# Patient Record
Sex: Female | Born: 1965 | Race: White | Hispanic: No | Marital: Married | Smoking: Never smoker
Health system: Southern US, Community
[De-identification: ages and names within clinical notes are randomized; demographics above are authoritative.]

## PROBLEM LIST (undated history)

## (undated) DIAGNOSIS — F32A Depression, unspecified: Secondary | ICD-10-CM

## (undated) DIAGNOSIS — Z8669 Personal history of other diseases of the nervous system and sense organs: Secondary | ICD-10-CM

## (undated) DIAGNOSIS — M1990 Unspecified osteoarthritis, unspecified site: Secondary | ICD-10-CM

## (undated) DIAGNOSIS — K219 Gastro-esophageal reflux disease without esophagitis: Secondary | ICD-10-CM

## (undated) DIAGNOSIS — F329 Major depressive disorder, single episode, unspecified: Secondary | ICD-10-CM

## (undated) DIAGNOSIS — M722 Plantar fascial fibromatosis: Secondary | ICD-10-CM

## (undated) DIAGNOSIS — F419 Anxiety disorder, unspecified: Secondary | ICD-10-CM

## (undated) DIAGNOSIS — L570 Actinic keratosis: Secondary | ICD-10-CM

## (undated) DIAGNOSIS — K5790 Diverticulosis of intestine, part unspecified, without perforation or abscess without bleeding: Secondary | ICD-10-CM

## (undated) DIAGNOSIS — M5136 Other intervertebral disc degeneration, lumbar region: Secondary | ICD-10-CM

## (undated) DIAGNOSIS — M51369 Other intervertebral disc degeneration, lumbar region without mention of lumbar back pain or lower extremity pain: Secondary | ICD-10-CM

## (undated) DIAGNOSIS — D649 Anemia, unspecified: Secondary | ICD-10-CM

## (undated) HISTORY — DX: Anxiety disorder, unspecified: F41.9

## (undated) HISTORY — PX: OTHER SURGICAL HISTORY: SHX169

## (undated) HISTORY — DX: Depression, unspecified: F32.A

## (undated) HISTORY — DX: Actinic keratosis: L57.0

## (undated) HISTORY — DX: Major depressive disorder, single episode, unspecified: F32.9

## (undated) HISTORY — PX: MICRODISCECTOMY LUMBAR: SUR864

## (undated) HISTORY — PX: LAPAROSCOPIC GASTRIC SLEEVE RESECTION: SHX5895

---

## 2008-08-11 ENCOUNTER — Ambulatory Visit: Payer: Self-pay | Admitting: Family Medicine

## 2008-08-25 ENCOUNTER — Ambulatory Visit: Payer: Self-pay | Admitting: Family Medicine

## 2009-02-24 ENCOUNTER — Ambulatory Visit: Payer: Self-pay | Admitting: Family Medicine

## 2009-08-15 ENCOUNTER — Ambulatory Visit: Payer: Self-pay | Admitting: Family Medicine

## 2010-08-09 ENCOUNTER — Ambulatory Visit: Payer: Self-pay

## 2010-08-22 DIAGNOSIS — D229 Melanocytic nevi, unspecified: Secondary | ICD-10-CM

## 2010-08-22 HISTORY — DX: Melanocytic nevi, unspecified: D22.9

## 2010-09-25 ENCOUNTER — Ambulatory Visit: Payer: Self-pay | Admitting: Family Medicine

## 2010-12-17 ENCOUNTER — Ambulatory Visit: Payer: Self-pay | Admitting: Gastroenterology

## 2011-12-09 ENCOUNTER — Ambulatory Visit: Payer: Self-pay | Admitting: Family Medicine

## 2012-05-11 ENCOUNTER — Ambulatory Visit (INDEPENDENT_AMBULATORY_CARE_PROVIDER_SITE_OTHER): Payer: BC Managed Care – PPO | Admitting: Family Medicine

## 2012-05-11 VITALS — BP 133/89 | Ht 66.0 in | Wt 220.0 lb

## 2012-05-11 DIAGNOSIS — S838X9A Sprain of other specified parts of unspecified knee, initial encounter: Secondary | ICD-10-CM

## 2012-05-11 DIAGNOSIS — M79669 Pain in unspecified lower leg: Secondary | ICD-10-CM

## 2012-05-11 DIAGNOSIS — S86119A Strain of other muscle(s) and tendon(s) of posterior muscle group at lower leg level, unspecified leg, initial encounter: Secondary | ICD-10-CM

## 2012-05-11 DIAGNOSIS — M79609 Pain in unspecified limb: Secondary | ICD-10-CM

## 2012-05-12 DIAGNOSIS — S86119A Strain of other muscle(s) and tendon(s) of posterior muscle group at lower leg level, unspecified leg, initial encounter: Secondary | ICD-10-CM | POA: Insufficient documentation

## 2012-05-12 NOTE — Progress Notes (Signed)
  Subjective:    Patient ID: Bethany Flores, female    DOB: [birth year], 46 y.o.   MRN: [id number]  HPI Left calf pain with original injury June 3. Noticed after running up hills that she had some left calf pain. It improved after a couple of days. Yesterday he she took off quickly from a standing position to chase her son and felt an immediate pop and pain in the same place in her calf. Today it is painful to walk on that leg. Calf is tender. Prior to gym she had no problems with her calf, sever had knee surgery her knee problems.   Review of Systems    denies unusual weight change, denies numbness or tingling in left leg or foot. Objective:   Physical Exam  Vital signs reviewed. GENERAL: Well developed, well nourished, no acute distress Left calf has a fairly obvious area of defect on the medial portion of the gastrocnemius muscle. She has intact plantar and dorsiflexion. The area of defect is tender to palpation. ULTRASOUND:  Ultrasound reveals the intact lateral gastrocnemius muscle and an area on the medial gastroc of disrupted muscle fiber. This area coincides with the area of physical observable defect and tenderness.     Assessment & Plan:

## 2012-05-29 ENCOUNTER — Ambulatory Visit (INDEPENDENT_AMBULATORY_CARE_PROVIDER_SITE_OTHER): Payer: BC Managed Care – PPO | Admitting: Family Medicine

## 2012-05-29 VITALS — BP 122/83

## 2012-05-29 DIAGNOSIS — S86119A Strain of other muscle(s) and tendon(s) of posterior muscle group at lower leg level, unspecified leg, initial encounter: Secondary | ICD-10-CM

## 2012-05-29 DIAGNOSIS — S86819A Strain of other muscle(s) and tendon(s) at lower leg level, unspecified leg, initial encounter: Secondary | ICD-10-CM

## 2012-05-29 NOTE — Assessment & Plan Note (Signed)
She's done extremely well. We will keep her on a gradual return to activity. Next week she will run a max of 10 minutes a day flat level no hills no sprints a sudden starts her stops. We'll begin her on a little bit more stretching. Not full E. Centric stretches yet. Over the next 2-4 weeks she will workup to single stance E. Centric stretches and is redemonstrated today. Over the next 4 weeks she will increase by 10 minutes per week until she is running her typical 40 minutes a day. With any set backs she'll readjust her schedule. I'll see her in followup when necessary.

## 2012-05-29 NOTE — Progress Notes (Signed)
  Subjective:    Patient ID: Bethany Flores, female    DOB: [birth year], 46 y.o.   MRN: [id number]  HPI Followup gastrocnemius tear. She has followed instructions and she feels 75% better. She has not been running. She is anxious to get back to activity. No new symptoms. Pain is essentially resolved at rest and she has some pain with walking longer than about 30 minutes.   Review of Systems Denies foot numbness    Objective:   Physical Exam   , RednessGENERAL: Well-developed female no acute distress vital signs are reviewed LEFT calf: Nontender to palpation. They're still a slight defect noted intact dorsiflexion and plantar flexion strength with no pain with resisted motion. ULTRASOUND: The Has caused her 1 cm with 20.46 cm width.     Assessment & Plan:

## 2012-06-18 ENCOUNTER — Ambulatory Visit (INDEPENDENT_AMBULATORY_CARE_PROVIDER_SITE_OTHER): Payer: BC Managed Care – PPO | Admitting: Sports Medicine

## 2012-06-18 VITALS — BP 126/87

## 2012-06-18 DIAGNOSIS — M79669 Pain in unspecified lower leg: Secondary | ICD-10-CM

## 2012-06-18 DIAGNOSIS — M79609 Pain in unspecified limb: Secondary | ICD-10-CM

## 2012-06-18 MED ORDER — AMITRIPTYLINE HCL 25 MG PO TABS: 25.0000 mg | ORAL_TABLET | Freq: Every day | ORAL | Status: DC

## 2012-06-18 NOTE — Assessment & Plan Note (Signed)
Notices primarily located over the right lateral compartment I am not sure whether this has been triggered by sciatic irritation from her back or by an exercise related compartment syndrome  history of numbness and her early feelings of foot drop make compartment syndrome a strong possibility  Trial of amitriptyline 25 at night for nerve irritation and 4 relaxation of muscles around low back Barefoot walk- jogging drill once or twice a week  Continue back stretches-and knee to chest and knee to shoulder  Recheck 6 weeks

## 2012-06-18 NOTE — Patient Instructions (Addendum)
Start taking amitriptyline 25 mg at bedtime  Do barefoot jogging on grass 10 x 50 yards 1-2 times per week  Stop jogging at 2 miles for now  Do knees to chest stretch and knee to opposite side of chest  Please follow up in 6 weeks  Thank you for seeing [place] today!

## 2012-06-18 NOTE — Progress Notes (Signed)
  Subjective:    Patient ID: Bethany Flores, female    DOB: 11/25/1965, 46 y.o.   MRN: [id number]  HPI  Rt lateral foot and ankle pain x 3 years.  Was taking tae kwon do at the time pain started. Started running with Thad at [employer] x2 yrs ago- had the pain with running then. Top of rt foot gets numb with running 1-1.5 mile for the last 3 weeks. States she feels like her foot is "flopping", but it does not appear to be.  Runs 9-12 MPW. Only has the foot/ankle pain with running.  Has hx of sciatica on lt, and possible buldging disc although she never had an MRI. Went to Dr. Thereasa Distance for chiropractic care and this helped  Review of Systems     Objective:   Physical Exam  NAD Normal arch and foot structure Leg lengths equal Slight genu valgus Good strength on toe extension and flexion bilat Strong eversion, ER, plantar flexion, dorsiflexion bilat Straight leg raise to 80 on lt, and 90 on rt Toe and heel walking with no problems Able to walk on outside of feet  Ankle and knee reflexes normal Running gait neutral      Assessment & Plan:

## 2012-07-30 ENCOUNTER — Ambulatory Visit: Payer: BC Managed Care – PPO | Admitting: Sports Medicine

## 2012-08-28 ENCOUNTER — Other Ambulatory Visit: Payer: Self-pay | Admitting: Chiropractic Medicine

## 2012-08-28 DIAGNOSIS — M545 Low back pain: Secondary | ICD-10-CM

## 2012-09-02 ENCOUNTER — Ambulatory Visit
Admission: RE | Admit: 2012-09-02 | Discharge: 2012-09-02 | Disposition: A | Discharge status: Home / Self Care | Payer: BC Managed Care – PPO | Source: Ambulatory Visit | Attending: Chiropractic Medicine | Admitting: Chiropractic Medicine

## 2012-09-02 DIAGNOSIS — M545 Low back pain: Secondary | ICD-10-CM

## 2013-05-27 ENCOUNTER — Ambulatory Visit: Payer: Self-pay | Admitting: Specialist

## 2013-05-27 LAB — LIPASE, BLOOD: Lipase: 79 U/L (ref 73–393)

## 2013-05-27 LAB — CBC WITH DIFFERENTIAL/PLATELET
Basophil #: 0 10*3/uL (ref 0.0–0.1)
Eosinophil %: 2.9 %
HCT: 32.5 % — ABNORMAL LOW (ref 35.0–47.0)
HGB: 11.1 g/dL — ABNORMAL LOW (ref 12.0–16.0)
Lymphocyte #: 2 10*3/uL (ref 1.0–3.6)
Lymphocyte %: 27.4 %
MCH: 26.4 pg (ref 26.0–34.0)
MCHC: 34 g/dL (ref 32.0–36.0)
MCV: 78 fL — ABNORMAL LOW (ref 80–100)
Monocyte %: 4.7 %
Neutrophil #: 4.8 10*3/uL (ref 1.4–6.5)
RDW: 15.2 % — ABNORMAL HIGH (ref 11.5–14.5)

## 2013-05-27 LAB — COMPREHENSIVE METABOLIC PANEL
Alkaline Phosphatase: 117 U/L (ref 50–136)
BUN: 17 mg/dL (ref 7–18)
Bilirubin,Total: 0.4 mg/dL (ref 0.2–1.0)
Calcium, Total: 8.5 mg/dL (ref 8.5–10.1)
Creatinine: 0.85 mg/dL (ref 0.60–1.30)
EGFR (African American): >60
EGFR (Non-African Amer.): >60
SGPT (ALT): 19 U/L (ref 12–78)
Sodium: 139 mmol/L (ref 136–145)

## 2013-05-27 LAB — IRON AND TIBC
Iron Bind.Cap.(Total): 395 ug/dL (ref 250–450)
Iron Saturation: 9 %
Iron: 35 ug/dL — ABNORMAL LOW (ref 50–170)

## 2013-05-27 LAB — PROTIME-INR
INR: 1.1
Prothrombin Time: 13.9 secs (ref 11.5–14.7)

## 2013-05-27 LAB — BILIRUBIN, DIRECT: Bilirubin, Direct: 0.1 mg/dL (ref 0.00–0.20)

## 2013-05-27 LAB — PHOSPHORUS: Phosphorus: 2.9 mg/dL (ref 2.5–4.9)

## 2013-05-27 LAB — APTT: Activated PTT: 30.3 secs (ref 23.6–35.9)

## 2013-05-27 LAB — FERRITIN: Ferritin (ARMC): 12 ng/mL (ref 8–388)

## 2013-05-27 LAB — MAGNESIUM: Magnesium: 1.7 mg/dL — ABNORMAL LOW

## 2013-05-27 LAB — TSH: Thyroid Stimulating Horm: 1.87 u[IU]/mL

## 2013-05-27 LAB — HEMOGLOBIN A1C: Hemoglobin A1C: 5.2 % (ref 4.2–6.3)

## 2013-05-27 LAB — FOLATE: Folic Acid: 13.6 ng/mL (ref 3.1–100.0)

## 2013-06-16 ENCOUNTER — Ambulatory Visit: Payer: Self-pay | Admitting: Specialist

## 2013-06-18 ENCOUNTER — Ambulatory Visit: Payer: Self-pay | Admitting: Specialist

## 2013-07-08 ENCOUNTER — Ambulatory Visit: Payer: Self-pay | Admitting: Family Medicine

## 2013-08-26 ENCOUNTER — Ambulatory Visit: Payer: Self-pay | Admitting: Oncology

## 2013-08-26 LAB — CBC CANCER CENTER
Basophil %: 0.8 %
Eosinophil #: 0.3 x10 3/mm (ref 0.0–0.7)
HCT: 36 % (ref 35.0–47.0)
HGB: 12.1 g/dL (ref 12.0–16.0)
Lymphocyte %: 27.8 %
MCH: 26.2 pg (ref 26.0–34.0)
MCHC: 33.7 g/dL (ref 32.0–36.0)
MCV: 78 fL — ABNORMAL LOW (ref 80–100)
Monocyte #: 0.5 x10 3/mm (ref 0.2–0.9)
Monocyte %: 6.1 %
Neutrophil #: 5.1 x10 3/mm (ref 1.4–6.5)
Neutrophil %: 61.8 %
Platelet: 416 x10 3/mm (ref 150–440)
RBC: 4.64 10*6/uL (ref 3.80–5.20)
WBC: 8.3 x10 3/mm (ref 3.6–11.0)

## 2013-08-26 LAB — IRON AND TIBC
Iron Bind.Cap.(Total): 383 ug/dL (ref 250–450)
Iron Saturation: 8 %
Iron: 31 ug/dL — ABNORMAL LOW (ref 50–170)
Unbound Iron-Bind.Cap.: 352 ug/dL

## 2013-08-26 LAB — FERRITIN: Ferritin (ARMC): 18 ng/mL (ref 8–388)

## 2013-09-18 ENCOUNTER — Ambulatory Visit: Payer: Self-pay | Admitting: Oncology

## 2013-11-04 ENCOUNTER — Ambulatory Visit: Payer: Self-pay | Admitting: Oncology

## 2013-11-04 LAB — FERRITIN: Ferritin (ARMC): 47 ng/mL (ref 8–388)

## 2013-11-04 LAB — CBC CANCER CENTER
Basophil #: 0.1 x10 3/mm (ref 0.0–0.1)
Basophil %: 0.8 %
Eosinophil #: 0.1 x10 3/mm (ref 0.0–0.7)
Eosinophil %: 1.2 %
HCT: 40.8 % (ref 35.0–47.0)
HGB: 13.5 g/dL (ref 12.0–16.0)
Lymphocyte #: 2.3 x10 3/mm (ref 1.0–3.6)
Lymphocyte %: 26.2 %
MCV: 85 fL (ref 80–100)
Monocyte %: 4 %
Neutrophil #: 5.8 x10 3/mm (ref 1.4–6.5)
Neutrophil %: 67.8 %
Platelet: 359 x10 3/mm (ref 150–440)
RDW: 16.8 % — ABNORMAL HIGH (ref 11.5–14.5)
WBC: 8.6 x10 3/mm (ref 3.6–11.0)

## 2013-11-04 LAB — IRON AND TIBC
Iron Bind.Cap.(Total): 299 ug/dL (ref 250–450)
Iron Saturation: 20 %
Iron: 60 ug/dL (ref 50–170)

## 2013-11-18 ENCOUNTER — Ambulatory Visit: Payer: Self-pay | Admitting: Oncology

## 2014-01-17 ENCOUNTER — Ambulatory Visit: Payer: Self-pay | Admitting: Specialist

## 2014-02-03 ENCOUNTER — Ambulatory Visit: Payer: Self-pay | Admitting: Oncology

## 2014-02-03 LAB — FERRITIN: Ferritin (ARMC): 72 ng/mL (ref 8–388)

## 2014-02-03 LAB — CBC CANCER CENTER
BASOS ABS: 0 x10 3/mm (ref 0.0–0.1)
BASOS PCT: 0.6 %
Eosinophil #: 0.3 x10 3/mm (ref 0.0–0.7)
Eosinophil %: 3.7 %
HCT: 39.7 % (ref 35.0–47.0)
HGB: 13 g/dL (ref 12.0–16.0)
Lymphocyte #: 1.8 x10 3/mm (ref 1.0–3.6)
Lymphocyte %: 26.3 %
MCH: 28.9 pg (ref 26.0–34.0)
MCHC: 32.9 g/dL (ref 32.0–36.0)
MCV: 88 fL (ref 80–100)
MONOS PCT: 5.6 %
Monocyte #: 0.4 x10 3/mm (ref 0.2–0.9)
Neutrophil #: 4.4 x10 3/mm (ref 1.4–6.5)
Neutrophil %: 63.8 %
PLATELETS: 300 x10 3/mm (ref 150–440)
RBC: 4.51 10*6/uL (ref 3.80–5.20)
RDW: 13.8 % (ref 11.5–14.5)
WBC: 7 x10 3/mm (ref 3.6–11.0)

## 2014-02-03 LAB — IRON AND TIBC
Iron Bind.Cap.(Total): 214 ug/dL — ABNORMAL LOW (ref 250–450)
Iron Saturation: 18 %
Iron: 39 ug/dL — ABNORMAL LOW (ref 50–170)
UNBOUND IRON-BIND. CAP.: 175 ug/dL

## 2014-02-16 ENCOUNTER — Ambulatory Visit: Payer: Self-pay | Admitting: Oncology

## 2014-02-16 ENCOUNTER — Ambulatory Visit: Payer: Self-pay | Admitting: Specialist

## 2014-05-06 ENCOUNTER — Ambulatory Visit: Payer: Self-pay | Admitting: Oncology

## 2014-05-06 LAB — CBC CANCER CENTER
BASOS ABS: 0.1 x10 3/mm (ref 0.0–0.1)
BASOS PCT: 0.6 %
Eosinophil #: 0.1 x10 3/mm (ref 0.0–0.7)
Eosinophil %: 0.8 %
HCT: 39 % (ref 35.0–47.0)
HGB: 13 g/dL (ref 12.0–16.0)
Lymphocyte #: 2.3 x10 3/mm (ref 1.0–3.6)
Lymphocyte %: 26.6 %
MCH: 30 pg (ref 26.0–34.0)
MCHC: 33.3 g/dL (ref 32.0–36.0)
MCV: 90 fL (ref 80–100)
MONOS PCT: 5.1 %
Monocyte #: 0.4 x10 3/mm (ref 0.2–0.9)
Neutrophil #: 5.7 x10 3/mm (ref 1.4–6.5)
Neutrophil %: 66.9 %
Platelet: 352 x10 3/mm (ref 150–440)
RBC: 4.33 10*6/uL (ref 3.80–5.20)
RDW: 13.2 % (ref 11.5–14.5)
WBC: 8.5 x10 3/mm (ref 3.6–11.0)

## 2014-05-06 LAB — IRON AND TIBC
Iron Bind.Cap.(Total): 303 ug/dL (ref 250–450)
Iron Saturation: 14 %
Iron: 41 ug/dL — ABNORMAL LOW (ref 50–170)
UNBOUND IRON-BIND. CAP.: 262 ug/dL

## 2014-05-06 LAB — FERRITIN: FERRITIN (ARMC): 21 ng/mL (ref 8–388)

## 2014-05-18 ENCOUNTER — Ambulatory Visit: Payer: Self-pay | Admitting: Oncology

## 2014-08-05 ENCOUNTER — Ambulatory Visit: Payer: Self-pay | Admitting: Oncology

## 2014-08-08 LAB — CBC CANCER CENTER
BASOS ABS: 0.1 x10 3/mm (ref 0.0–0.1)
Basophil %: 0.8 %
Eosinophil #: 0.1 x10 3/mm (ref 0.0–0.7)
Eosinophil %: 1.3 %
HCT: 40.1 % (ref 35.0–47.0)
HGB: 13.3 g/dL (ref 12.0–16.0)
Lymphocyte #: 1.9 x10 3/mm (ref 1.0–3.6)
Lymphocyte %: 30.9 %
MCH: 30.5 pg (ref 26.0–34.0)
MCHC: 33.3 g/dL (ref 32.0–36.0)
MCV: 92 fL (ref 80–100)
Monocyte #: 0.3 x10 3/mm (ref 0.2–0.9)
Monocyte %: 5.5 %
NEUTROS ABS: 3.8 x10 3/mm (ref 1.4–6.5)
NEUTROS PCT: 61.5 %
PLATELETS: 316 x10 3/mm (ref 150–440)
RBC: 4.37 10*6/uL (ref 3.80–5.20)
RDW: 13.4 % (ref 11.5–14.5)
WBC: 6.2 x10 3/mm (ref 3.6–11.0)

## 2014-08-08 LAB — IRON AND TIBC
IRON BIND. CAP.(TOTAL): 301 ug/dL (ref 250–450)
IRON SATURATION: 20 %
Iron: 60 ug/dL (ref 50–170)
UNBOUND IRON-BIND. CAP.: 241 ug/dL

## 2014-08-08 LAB — FERRITIN: Ferritin (ARMC): 19 ng/mL (ref 8–388)

## 2014-08-18 ENCOUNTER — Ambulatory Visit: Payer: Self-pay | Admitting: Oncology

## 2014-11-14 ENCOUNTER — Ambulatory Visit: Payer: Self-pay | Admitting: Family Medicine

## 2014-12-07 ENCOUNTER — Encounter: Payer: Self-pay | Admitting: Sports Medicine

## 2014-12-07 ENCOUNTER — Ambulatory Visit (INDEPENDENT_AMBULATORY_CARE_PROVIDER_SITE_OTHER): Payer: BLUE CROSS/BLUE SHIELD | Admitting: Sports Medicine

## 2014-12-07 VITALS — BP 113/75 | HR 66 | Ht 66.0 in | Wt 150.0 lb

## 2014-12-07 DIAGNOSIS — M7631 Iliotibial band syndrome, right leg: Secondary | ICD-10-CM | POA: Diagnosis not present

## 2014-12-11 NOTE — Progress Notes (Signed)
  Bethany Flores - 49 y.o. female MRN [id number]  Date of birth: [birth year]  SUBJECTIVE: Chief Complaint:   right hip pain Patient reports 2 months of worsening right hip pain with increasing training for a half marathon that's coming up in April. She is working with [employer]. Pain along the greater trochanter that does not radiate. This does bother her at night and if she rolls over on it at will wake her but it is not generally keeping her up. She denies any numbness or tingling or pain radiating down past her upper thigh. She has had issues with IT band syndrome before and believes this is reoccurring. She has been trying from rolling and gentle stretching. No other therapeutic exercises.  ROS:  Per HPI.   HISTORY: Past Medical, Surgical, Social, and Family History Reviewed & Updated per EMR.  Pertinent Historical Findings include:  reports that she has never smoked. She does not have any smokeless tobacco history on file. Otherwise  Relatively healthy - on Zoloft   OBJECTIVE:  VS:   HT:5\' 6"  (167.6 cm)   WT:150 lb (68.04 kg)  BMI:24.3          BP:113/75 mmHg  HR:66bpm  TEMP: ( )  RESP:   PHYSICAL EXAM: GENERAL:  adult Caucasian female. In no discomfort; no respiratory distress   PSYCH: alert and appropriate, good insight   NEURO: sensation is intact to light touch in bilateral lower extremities   VASCULAR: No significant LE edema.  RIGHT HIP EXAM overall normal-appearing well aligned. She has minimal tenderness to palpation over the greater trochanter and moderate tenderness over the belly of the IT band most focally at the knee. This is asymptomatic and less directly palpated. Negative Nobles test. Hip abduction strength testing shows 4 out of 5 right abductor strength,, 5 out of 5 on the left. Right does show a TFL predominance. 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  ASSESSMENT: 1. IT band syndrome, right    PLAN: See problem based charting & AVS for  additional documentation.  discussed therapeutic exercises including hip abduction strengthening provide her with the IT band handout. Showed her modified IT band stretching she was attempting ITB stretching however was performing this in the opposite direction.     We will plan to see her back in 6 weeks to check on her strengthening at that time.   She did not bring her running shoes with her today so we are not able to perform a gait analysis with should be done at follow-up.   Discussed options for potential injection but patient elected to defer this time and encouraged icing, stretching, hip abduction strengthening from rolling and anti-inflammatories when necessary.  > Return in about 6 weeks (around 01/18/2015) for Gait analysis and recheck hip abduction strength weakness.

## 2015-01-11 ENCOUNTER — Ambulatory Visit: Payer: BLUE CROSS/BLUE SHIELD | Admitting: Sports Medicine

## 2015-01-18 ENCOUNTER — Ambulatory Visit: Payer: BLUE CROSS/BLUE SHIELD | Admitting: Sports Medicine

## 2015-01-25 ENCOUNTER — Encounter: Payer: Self-pay | Admitting: Sports Medicine

## 2015-01-25 ENCOUNTER — Ambulatory Visit (INDEPENDENT_AMBULATORY_CARE_PROVIDER_SITE_OTHER): Payer: BLUE CROSS/BLUE SHIELD | Admitting: Sports Medicine

## 2015-01-25 VITALS — BP 106/70 | HR 69 | Ht 66.0 in | Wt 150.0 lb

## 2015-01-25 DIAGNOSIS — M7631 Iliotibial band syndrome, right leg: Secondary | ICD-10-CM

## 2015-01-25 DIAGNOSIS — M7630 Iliotibial band syndrome, unspecified leg: Secondary | ICD-10-CM | POA: Insufficient documentation

## 2015-01-25 NOTE — Progress Notes (Signed)
  Bethany Flores - 49 y.o. female MRN [id number]  Date of birth: August 09, 1966  SUBJECTIVE: CC: Right hip pain, follow-up HPI: Significant improvement in right hip pain since changing shoes and working on IT band stretching.  Continuing to training with [employer].  New shoes and working on ITB stretching almost immediately resolved her symptoms.  ROS: Denies any numbness, tingling, recent weight loss or weight gain.  HISTORY:  Past Medical, Surgical, Social, and Family History reviewed & updated per EMR.  Pertinent Historical Findings include:  reports that she has never smoked. She does not have any smokeless tobacco history on file.  OBJECTIVE:  VS:   HT:5\' 6"  (167.6 cm)   WT:150 lb (68.04 kg)  BMI:24.3          BP:106/70 mmHg  HR:69bpm  TEMP: ( )  RESP:   PHYSICAL EXAM:  GENERAL:  adult Caucasian female. In no discomfort; no respiratory distress   PSYCH: alert and appropriate, good insight   NEURO: sensation is intact to light touch in bilateral lower extremities   VASCULAR: No significant LE edema.  RIGHT HIP EXAM overall normal-appearing well aligned. She has minimal tenderness to palpation over the greater trochanter and minimal TTP over the belly of the IT band most focally at the knee.  Negative Nobles test.  Hip abduction strength: Right 4/5, left 5/5. Right TFL predominance. Symmetric leg lengths.  GAIT: Slight right pelvic shift with heel toe strike mild overpronation of left foot, right neutral  ASSESSMENT: 1. IT band syndrome, right     Overall significantly improved.  PLAN: See problem based charting & AVS for additional documentation.  Given her overall neutral gait & marked improvement in symptoms no changes to be made today.   Did discuss gentle OTC long arch support to help with over pronation of left foot but patient would like to try her new motion control shoes. > Return if symptoms worsen or fail to improve.

## 2015-04-25 ENCOUNTER — Emergency Department: Payer: BLUE CROSS/BLUE SHIELD

## 2015-04-25 ENCOUNTER — Encounter: Payer: Self-pay | Admitting: *Deleted

## 2015-04-25 ENCOUNTER — Inpatient Hospital Stay
Admission: EM | Admit: 2015-04-25 | Discharge: 2015-04-27 | DRG: 419 | Disposition: A | Discharge status: Home / Self Care | Payer: BLUE CROSS/BLUE SHIELD | Attending: Surgery | Admitting: Surgery

## 2015-04-25 DIAGNOSIS — Z9884 Bariatric surgery status: Secondary | ICD-10-CM | POA: Diagnosis not present

## 2015-04-25 DIAGNOSIS — K8010 Calculus of gallbladder with chronic cholecystitis without obstruction: Secondary | ICD-10-CM | POA: Diagnosis present

## 2015-04-25 DIAGNOSIS — K660 Peritoneal adhesions (postprocedural) (postinfection): Secondary | ICD-10-CM | POA: Diagnosis present

## 2015-04-25 DIAGNOSIS — K810 Acute cholecystitis: Secondary | ICD-10-CM | POA: Diagnosis present

## 2015-04-25 DIAGNOSIS — Z791 Long term (current) use of non-steroidal anti-inflammatories (NSAID): Secondary | ICD-10-CM | POA: Diagnosis not present

## 2015-04-25 DIAGNOSIS — Z79899 Other long term (current) drug therapy: Secondary | ICD-10-CM

## 2015-04-25 LAB — URINALYSIS COMPLETE WITH MICROSCOPIC (ARMC ONLY)
BILIRUBIN URINE: NEGATIVE
Glucose, UA: NEGATIVE mg/dL
Hgb urine dipstick: NEGATIVE
KETONES UR: NEGATIVE mg/dL
Nitrite: NEGATIVE
PH: 8 (ref 5.0–8.0)
Protein, ur: NEGATIVE mg/dL
SPECIFIC GRAVITY, URINE: 1.011 (ref 1.005–1.030)

## 2015-04-25 LAB — CBC WITH DIFFERENTIAL/PLATELET
BASOS PCT: 0 %
Basophils Absolute: 0 10*3/uL (ref 0–0.1)
Eosinophils Absolute: 0.1 10*3/uL (ref 0–0.7)
Eosinophils Relative: 1 %
HEMATOCRIT: 38.8 % (ref 35.0–47.0)
Hemoglobin: 12.8 g/dL (ref 12.0–16.0)
LYMPHS ABS: 2.1 10*3/uL (ref 1.0–3.6)
LYMPHS PCT: 27 %
MCH: 28.4 pg (ref 26.0–34.0)
MCHC: 32.9 g/dL (ref 32.0–36.0)
MCV: 86.3 fL (ref 80.0–100.0)
MONO ABS: 0.4 10*3/uL (ref 0.2–0.9)
Monocytes Relative: 5 %
NEUTROS PCT: 67 %
Neutro Abs: 5.3 10*3/uL (ref 1.4–6.5)
PLATELETS: 290 10*3/uL (ref 150–440)
RBC: 4.5 MIL/uL (ref 3.80–5.20)
RDW: 13.7 % (ref 11.5–14.5)
WBC: 7.9 10*3/uL (ref 3.6–11.0)

## 2015-04-25 LAB — COMPREHENSIVE METABOLIC PANEL
ALK PHOS: 69 U/L (ref 38–126)
ALT: 13 U/L — AB (ref 14–54)
AST: 18 U/L (ref 15–41)
Albumin: 4.4 g/dL (ref 3.5–5.0)
Anion gap: 9 (ref 5–15)
BILIRUBIN TOTAL: 1.2 mg/dL (ref 0.3–1.2)
BUN: 20 mg/dL (ref 6–20)
CHLORIDE: 102 mmol/L (ref 101–111)
CO2: 26 mmol/L (ref 22–32)
Calcium: 9.1 mg/dL (ref 8.9–10.3)
Creatinine, Ser: 0.78 mg/dL (ref 0.44–1.00)
GFR calc Af Amer: >60 mL/min (ref >60)
Glucose, Bld: 92 mg/dL (ref 65–99)
Potassium: 4.1 mmol/L (ref 3.5–5.1)
Sodium: 137 mmol/L (ref 135–145)
TOTAL PROTEIN: 7.6 g/dL (ref 6.5–8.1)

## 2015-04-25 LAB — AMYLASE: Amylase: 25 U/L — ABNORMAL LOW (ref 28–100)

## 2015-04-25 LAB — LIPASE, BLOOD: Lipase: 47 U/L (ref 22–51)

## 2015-04-25 LAB — TROPONIN I: Troponin I: <0.03 ng/mL (ref <0.031)

## 2015-04-25 LAB — POCT PREGNANCY, URINE: PREG TEST UR: NEGATIVE

## 2015-04-25 MED ORDER — MORPHINE SULFATE 4 MG/ML IJ SOLN
INTRAMUSCULAR | Status: AC
  Filled 2015-04-25: qty 1

## 2015-04-25 MED ORDER — DIPHENHYDRAMINE HCL 12.5 MG/5ML PO ELIX
12.5000 mg | ORAL_SOLUTION | Freq: Four times a day (QID) | ORAL | Status: DC | PRN
  Filled 2015-04-25: qty 5

## 2015-04-25 MED ORDER — SERTRALINE HCL 50 MG PO TABS
ORAL_TABLET | ORAL | Status: AC
  Filled 2015-04-25: qty 1

## 2015-04-25 MED ORDER — GI COCKTAIL ~~LOC~~
ORAL | Status: AC
  Filled 2015-04-25: qty 30

## 2015-04-25 MED ORDER — IOHEXOL 300 MG/ML  SOLN
100.0000 mL | Freq: Once | INTRAMUSCULAR | Status: AC | PRN
  Administered 2015-04-25: 100 mL via INTRAVENOUS

## 2015-04-25 MED ORDER — ACETAMINOPHEN 325 MG PO TABS: 650.0000 mg | ORAL_TABLET | Freq: Four times a day (QID) | ORAL | Status: DC | PRN

## 2015-04-25 MED ORDER — FAMOTIDINE IN NACL 20-0.9 MG/50ML-% IV SOLN
INTRAVENOUS | Status: AC
  Filled 2015-04-25: qty 50

## 2015-04-25 MED ORDER — METRONIDAZOLE IN NACL 5-0.79 MG/ML-% IV SOLN
INTRAVENOUS | Status: AC
  Administered 2015-04-25: 500 mg via INTRAVENOUS
  Filled 2015-04-25: qty 100

## 2015-04-25 MED ORDER — CIPROFLOXACIN IN D5W 400 MG/200ML IV SOLN
400.0000 mg | Freq: Once | INTRAVENOUS | Status: AC
  Administered 2015-04-25: 400 mg via INTRAVENOUS

## 2015-04-25 MED ORDER — ACETAMINOPHEN 650 MG RE SUPP: 650.0000 mg | Freq: Four times a day (QID) | RECTAL | Status: DC | PRN

## 2015-04-25 MED ORDER — FAMOTIDINE IN NACL 20-0.9 MG/50ML-% IV SOLN
20.0000 mg | Freq: Two times a day (BID) | INTRAVENOUS | Status: DC
  Administered 2015-04-25 – 2015-04-26 (×2): 20 mg via INTRAVENOUS
  Filled 2015-04-25 (×3): qty 50

## 2015-04-25 MED ORDER — SODIUM CHLORIDE 0.9 % IV BOLUS (SEPSIS)
1000.0000 mL | Freq: Once | INTRAVENOUS | Status: AC
  Administered 2015-04-25: 1000 mL via INTRAVENOUS

## 2015-04-25 MED ORDER — KCL IN DEXTROSE-NACL 20-5-0.45 MEQ/L-%-% IV SOLN
INTRAVENOUS | Status: DC
  Administered 2015-04-25 – 2015-04-27 (×6): via INTRAVENOUS
  Filled 2015-04-25 (×10): qty 1000

## 2015-04-25 MED ORDER — KCL IN DEXTROSE-NACL 20-5-0.45 MEQ/L-%-% IV SOLN
INTRAVENOUS | Status: AC
  Filled 2015-04-25: qty 1000

## 2015-04-25 MED ORDER — METRONIDAZOLE IN NACL 5-0.79 MG/ML-% IV SOLN
500.0000 mg | Freq: Once | INTRAVENOUS | Status: AC
  Administered 2015-04-25: 500 mg via INTRAVENOUS

## 2015-04-25 MED ORDER — MORPHINE SULFATE 2 MG/ML IJ SOLN
2.0000 mg | INTRAMUSCULAR | Status: DC | PRN
  Administered 2015-04-25: 4 mg via INTRAVENOUS
  Administered 2015-04-26 (×2): 2 mg via INTRAVENOUS
  Filled 2015-04-25: qty 1
  Filled 2015-04-25 (×2): qty 2

## 2015-04-25 MED ORDER — PROMETHAZINE HCL 25 MG PO TABS: 25.0000 mg | ORAL_TABLET | Freq: Four times a day (QID) | ORAL | Status: DC | PRN

## 2015-04-25 MED ORDER — SERTRALINE HCL 50 MG PO TABS
50.0000 mg | ORAL_TABLET | Freq: Every day | ORAL | Status: DC
  Administered 2015-04-25 – 2015-04-27 (×2): 50 mg via ORAL
  Filled 2015-04-25: qty 1

## 2015-04-25 MED ORDER — GI COCKTAIL ~~LOC~~
30.0000 mL | Freq: Once | ORAL | Status: AC
  Administered 2015-04-25: 30 mL via ORAL

## 2015-04-25 MED ORDER — SODIUM CHLORIDE 0.9 % IV SOLN
1.5000 g | Freq: Four times a day (QID) | INTRAVENOUS | Status: DC
  Administered 2015-04-25 – 2015-04-27 (×6): 1.5 g via INTRAVENOUS
  Filled 2015-04-25 (×7): qty 1.5

## 2015-04-25 MED ORDER — ONDANSETRON HCL 4 MG/2ML IJ SOLN
4.0000 mg | Freq: Once | INTRAMUSCULAR | Status: AC
  Administered 2015-04-25: 4 mg via INTRAVENOUS

## 2015-04-25 MED ORDER — CIPROFLOXACIN IN D5W 400 MG/200ML IV SOLN
INTRAVENOUS | Status: AC
  Filled 2015-04-25: qty 200

## 2015-04-25 MED ORDER — PROMETHAZINE HCL 25 MG/ML IJ SOLN
25.0000 mg | Freq: Four times a day (QID) | INTRAMUSCULAR | Status: DC | PRN
Start: 2015-04-25 — End: 2015-04-27

## 2015-04-25 MED ORDER — ONDANSETRON HCL 4 MG/2ML IJ SOLN
INTRAMUSCULAR | Status: AC
  Filled 2015-04-25: qty 2

## 2015-04-25 MED ORDER — DIPHENHYDRAMINE HCL 50 MG/ML IJ SOLN: 12.5000 mg | Freq: Four times a day (QID) | INTRAMUSCULAR | Status: DC | PRN

## 2015-04-25 MED ORDER — MORPHINE SULFATE 4 MG/ML IJ SOLN
4.0000 mg | Freq: Once | INTRAMUSCULAR | Status: AC
  Administered 2015-04-25: 4 mg via INTRAVENOUS

## 2015-04-25 MED ORDER — IOHEXOL 240 MG/ML SOLN
25.0000 mL | Freq: Once | INTRAMUSCULAR | Status: AC | PRN
  Administered 2015-04-25: 50 mL via ORAL

## 2015-04-25 NOTE — ED Notes (Signed)
Pt reports abdominal/epigastric pain starting this morning, pt reports having" dry heaves" this morning

## 2015-04-25 NOTE — H&P (Signed)
Patient ID: Bethany Flores, female   DOB: Sep 07, 1966, 49 y.o.   MRN: [id number]  History of Present Illness Bethany Flores is a 49 y.o. female with severe xiphoid and sub-right breast pain since last night, that waxes and wanes but never completely resolves. She had some dry heaves initially and since that time denies nausea and vomiting. She denies fever, chills, change in the color of her urine, stool, eyes, and skin.  Past Medical History History reviewed. No pertinent past medical history. The patient underwent a laparoscopic gastric sleeve resection by Dr. Darnell Level last year and lost 80 pounds.   No past surgical history on file.  No Known Allergies  Current Facility-Administered Medications  Medication Dose Route Frequency Provider Last Rate Last Dose  . ciprofloxacin (CIPRO) IVPB 400 mg  400 mg Intravenous Once Orbie Pyo, MD 200 mL/hr at 04/25/15 1329 400 mg at 04/25/15 1329  . metroNIDAZOLE (FLAGYL) IVPB 500 mg  500 mg Intravenous Once Orbie Pyo, MD       Current Outpatient Prescriptions  Medication Sig Dispense Refill  . acetaminophen (TYLENOL) 325 MG tablet Take 650 mg by mouth every 6 (six) hours as needed for moderate pain.    . Calcium Carbonate-Vitamin D 600-200 MG-UNIT TABS Take 1 tablet by mouth daily.    . ferrous sulfate 325 (65 FE) MG tablet Take 325 mg by mouth daily with breakfast.    . ibuprofen (ADVIL,MOTRIN) 200 MG tablet Take 400 mg by mouth every 6 (six) hours as needed for moderate pain.     . Multiple Vitamins-Minerals (MULTIVITAMIN ADULT) TABS Take 1 tablet by mouth daily.    . sertraline (ZOLOFT) 50 MG tablet Take 50 mg by mouth daily.    Marland Kitchen amitriptyline (ELAVIL) 25 MG tablet Take 1 tablet (25 mg total) by mouth at bedtime. (Patient not taking: Reported on 04/25/2015) 30 tablet 1    Family History No family history on file.   Her father died in a motor vehicle accident at age 60 and her mother is currently 52 and suffers from  glaucoma only.  Social History History  Substance Use Topics  . Smoking status: Never Smoker   . Smokeless tobacco: Not on file  . Alcohol Use: 0.0 oz/week    0 Standard drinks or equivalent per week     Comment: occasionally     Married, lives with her husband and one child. She works as an Optometrist for [employer].  Review of Systems A 10 point review of systems was asked and was negative except for the following positive findings: Severe epigastric pain radiating to her back.  Physical Exam Blood pressure 132/72, pulse 85, resp. rate 16, last menstrual period 04/11/2015, SpO2 98 %.  CONSTITUTIONAL: Pleasant, healthy-appearing middle-aged white female. EYES: Pupils equal, round, and reactive to light, Sclera non-icteric. EARS, NOSE, MOUTH AND THROAT: The oropharynx is clear. Oral mucosa is pink and moist. Hearing is intact to voice.  NECK: Trachea is midline, and there is no jugular venous distension. Thyroid is without palpable abnormalities. LYMPH NODES:  Lymph nodes in the neck are not enlarged. RESPIRATORY:  Lungs are clear, and breath sounds are equal bilaterally. Normal respiratory effort without pathologic use of accessory muscles. CARDIOVASCULAR: Heart is regular without murmurs, gallops, or rubs. GI: The abdomen is soft, nontender, and nondistended. There were no palpable masses. There was no hepatosplenomegaly. There were normal bowel sounds. GU: Deferred. MUSCULOSKELETAL:  Normal muscle strength and tone in all four extremities.  SKIN: Skin turgor is normal. There are no pathologic skin lesions.  NEUROLOGIC:  Motor and sensation is grossly normal.  Cranial nerves are grossly intact. PSYCH:  Alert and oriented to person, place and time. Affect is normal.  Data Reviewed All labs and CT scan. I have personally reviewed the patient's imaging and medical records.    Assessment    Acute cholecystitis  Plan    Admit, IV fluid hydration, IV antibiotics, laparoscopic  cholecystectomy tomorrow. The patient and her husband understand the 1 in 200 risk of common bile duct injury and that although rare, if it occurs, it is a quite serious complication. She is interested in proceeding with the plan.  Face-to-face time spent with the patient and care providers was 50 minutes, with more than 50% of the time spent counseling, educating, and coordinating care of the patient.     Consuela Mimes 04/25/2015, 1:54 PM

## 2015-04-25 NOTE — ED Provider Notes (Signed)
[facility] Emergency Department Provider Note  ____________________________________________  Time seen: Approximately 950 AM  I have reviewed the triage vital signs and the nursing notes.   HISTORY  Chief Complaint Abdominal Pain    HPI Bethany Flores is a 49 y.o. female with a history of gastric sleeve procedure done in February 2015 who presents today with upper abdominal burning and cramping that started at midnight last night. She has had several dry heaves no productive vomitus. No diarrhea. Pain is moderate. Tried Tums at home without any relief. No vaginal bleeding or dysuria. Patient is not post menopausal.   History reviewed. No pertinent past medical history.  Patient Active Problem List   Diagnosis Date Noted  . IT band syndrome 01/25/2015  . Gastrocnemius muscle tear 05/12/2012  . Calf pain 05/11/2012    No past surgical history on file.  Current Outpatient Rx  Name  Route  Sig  Dispense  Refill  . acetaminophen (TYLENOL) 325 MG tablet   Oral   Take 650 mg by mouth every 6 (six) hours as needed for moderate pain.         . Calcium Carbonate-Vitamin D 600-200 MG-UNIT TABS   Oral   Take 1 tablet by mouth daily.         . ferrous sulfate 325 (65 FE) MG tablet   Oral   Take 325 mg by mouth daily with breakfast.         . ibuprofen (ADVIL,MOTRIN) 200 MG tablet   Oral   Take 400 mg by mouth every 6 (six) hours as needed for moderate pain.          . Multiple Vitamins-Minerals (MULTIVITAMIN ADULT) TABS   Oral   Take 1 tablet by mouth daily.         . sertraline (ZOLOFT) 50 MG tablet   Oral   Take 50 mg by mouth daily.         Marland Kitchen EXPIRED: amitriptyline (ELAVIL) 25 MG tablet   Oral   Take 1 tablet (25 mg total) by mouth at bedtime. Patient not taking: Reported on 04/25/2015   30 tablet   1     Allergies Review of patient's allergies indicates no known allergies.  No family history on file.  Social  History History  Substance Use Topics  . Smoking status: Never Smoker   . Smokeless tobacco: Not on file  . Alcohol Use: 0.0 oz/week    0 Standard drinks or equivalent per week     Comment: occasionally    Review of Systems Constitutional: No fever/chills Eyes: No visual changes. ENT: No sore throat. Cardiovascular: Denies chest pain. Respiratory: Denies shortness of breath. Gastrointestinal: As above  Genitourinary: Negative for dysuria. Musculoskeletal: Negative for back pain. Skin: Negative for rash. Neurological: Negative for headaches, focal weakness or numbness.  10-point ROS otherwise negative.  ____________________________________________   PHYSICAL EXAM:  VITAL SIGNS: ED Triage Vitals  Enc Vitals Group     BP 04/25/15 0906 120/79 mmHg     Pulse Rate 04/25/15 0906 57     Resp 04/25/15 0906 16     Temp --      Temp src --      SpO2 04/25/15 0906 100 %     Weight --      Height --      Head Cir --      Peak Flow --      Pain Score 04/25/15 0831 8  Pain Loc --      Pain Edu? --      Excl. in [place]? --     Constitutional: Alert and oriented. Well appearing and in no acute distress. Eyes: Conjunctivae are normal. PERRL. EOMI. Head: Atraumatic. Nose: No congestion/rhinnorhea. Mouth/Throat: Mucous membranes are moist.  Oropharynx non-erythematous. Neck: No stridor.   Cardiovascular: Normal rate, regular rhythm. Grossly normal heart sounds.  Good peripheral circulation. Respiratory: Normal respiratory effort.  No retractions. Lungs CTAB. Gastrointestinal: Soft with tenderness to the epigastrium. Right upper quadrant tenderness without Murphy sign.. No distention. No abdominal bruits. No CVA tenderness.  Musculoskeletal: No lower extremity tenderness nor edema.  No joint effusions. Neurologic:  Normal speech and language. No gross focal neurologic deficits are appreciated. Speech is normal. No gait instability. Skin:  Skin is warm, dry and intact. No rash  noted. Psychiatric: Mood and affect are normal. Speech and behavior are normal.  ____________________________________________   LABS (all labs ordered are listed, but only abnormal results are displayed)  Labs Reviewed  COMPREHENSIVE METABOLIC PANEL - Abnormal; Notable for the following:    ALT 13 (*)    All other components within normal limits  URINALYSIS COMPLETEWITH MICROSCOPIC ([facility] ONLY) - Abnormal; Notable for the following:    Color, Urine YELLOW (*)    APPearance HAZY (*)    Leukocytes, UA 3+ (*)    Bacteria, UA FEW (*)    Squamous Epithelial / LPF 0-5 (*)    All other components within normal limits  CBC WITH DIFFERENTIAL/PLATELET  LIPASE, BLOOD  TROPONIN I  POC URINE PREG, ED  POCT PREGNANCY, URINE   ____________________________________________  EKG  ED ECG REPORT I, Rennie Hack,  Youlanda Roys, the attending physician, personally viewed and interpreted this ECG.   Date: 04/25/2015  EKG Time: 827  Rate: 63  Rhythm: Sinus rhythm with short PR.  Axis: Normal axis  Intervals: Shortened PR  ST&T Change: None  ____________________________________________  RADIOLOGY  Gallbladder highly suspicious for acute cholecystitis with cholelithiasis. ____________________________________________   PROCEDURES    ____________________________________________   INITIAL IMPRESSION / ASSESSMENT AND PLAN / ED COURSE  Pertinent labs & imaging results that were available during my care of the patient were reviewed by me and considered in my medical decision making (see chart for details).  ----------------------------------------- 1:02 PM on 04/25/2015 -----------------------------------------  Patient still with colicky abdominal pain now requesting pain meds. Reexamined with epigastric and right upper quadrant tenderness. Exam is consistent with CT findings of cholecystitis. Call Dr. Glenice Bow who is in the operating room but will see the patient when available. We'll give  antibiotics. Plan to admit to the hospital for cholecystectomy. ____________________________________________   FINAL CLINICAL IMPRESSION(S) / ED DIAGNOSES  Acute cholecystitis. Initial visit.    Orbie Pyo, MD 04/25/15 [phone]

## 2015-04-25 NOTE — ED Notes (Signed)
Surgeon at bedside.  

## 2015-04-26 ENCOUNTER — Inpatient Hospital Stay: Payer: BLUE CROSS/BLUE SHIELD | Admitting: Registered Nurse

## 2015-04-26 ENCOUNTER — Encounter
Admission: EM | Disposition: A | Discharge status: Home / Self Care | Payer: Self-pay | Source: Home / Self Care | Attending: Surgery

## 2015-04-26 ENCOUNTER — Encounter: Payer: Self-pay | Admitting: Anesthesiology

## 2015-04-26 DIAGNOSIS — K810 Acute cholecystitis: Secondary | ICD-10-CM

## 2015-04-26 HISTORY — PX: CHOLECYSTECTOMY: SHX55

## 2015-04-26 LAB — COMPREHENSIVE METABOLIC PANEL
ALT: 14 U/L (ref 14–54)
AST: 17 U/L (ref 15–41)
Albumin: 3.4 g/dL — ABNORMAL LOW (ref 3.5–5.0)
Alkaline Phosphatase: 65 U/L (ref 38–126)
Anion gap: 6 (ref 5–15)
BILIRUBIN TOTAL: 1.4 mg/dL — AB (ref 0.3–1.2)
BUN: 8 mg/dL (ref 6–20)
CO2: 29 mmol/L (ref 22–32)
Calcium: 8.4 mg/dL — ABNORMAL LOW (ref 8.9–10.3)
Chloride: 106 mmol/L (ref 101–111)
Creatinine, Ser: 0.67 mg/dL (ref 0.44–1.00)
GFR calc Af Amer: >60 mL/min (ref >60)
GFR calc non Af Amer: >60 mL/min (ref >60)
GLUCOSE: 94 mg/dL (ref 65–99)
Potassium: 3.8 mmol/L (ref 3.5–5.1)
Sodium: 141 mmol/L (ref 135–145)
TOTAL PROTEIN: 6.2 g/dL — AB (ref 6.5–8.1)

## 2015-04-26 LAB — CBC
HCT: 33.8 % — ABNORMAL LOW (ref 35.0–47.0)
Hemoglobin: 11.2 g/dL — ABNORMAL LOW (ref 12.0–16.0)
MCH: 28.8 pg (ref 26.0–34.0)
MCHC: 33.2 g/dL (ref 32.0–36.0)
MCV: 86.9 fL (ref 80.0–100.0)
Platelets: 228 10*3/uL (ref 150–440)
RBC: 3.89 MIL/uL (ref 3.80–5.20)
RDW: 13.8 % (ref 11.5–14.5)
WBC: 6.1 10*3/uL (ref 3.6–11.0)

## 2015-04-26 LAB — AMYLASE: Amylase: 22 U/L — ABNORMAL LOW (ref 28–100)

## 2015-04-26 SURGERY — LAPAROSCOPIC CHOLECYSTECTOMY WITH INTRAOPERATIVE CHOLANGIOGRAM
Anesthesia: General | Wound class: Clean Contaminated

## 2015-04-26 MED ORDER — DEXAMETHASONE SODIUM PHOSPHATE 4 MG/ML IJ SOLN
INTRAMUSCULAR | Status: DC | PRN
  Administered 2015-04-26: 10 mg via INTRAVENOUS

## 2015-04-26 MED ORDER — MIDAZOLAM HCL 2 MG/2ML IJ SOLN
INTRAMUSCULAR | Status: DC | PRN
  Administered 2015-04-26: 2 mg via INTRAVENOUS

## 2015-04-26 MED ORDER — FENTANYL CITRATE (PF) 100 MCG/2ML IJ SOLN
25.0000 ug | INTRAMUSCULAR | Status: DC | PRN
  Administered 2015-04-26 (×4): 25 ug via INTRAVENOUS

## 2015-04-26 MED ORDER — FENTANYL CITRATE (PF) 100 MCG/2ML IJ SOLN
INTRAMUSCULAR | Status: AC
  Administered 2015-04-26: 25 ug via INTRAVENOUS
  Filled 2015-04-26: qty 2

## 2015-04-26 MED ORDER — NEOSTIGMINE METHYLSULFATE 10 MG/10ML IV SOLN
INTRAVENOUS | Status: DC | PRN
  Administered 2015-04-26: 4.5 mg via INTRAVENOUS

## 2015-04-26 MED ORDER — FENTANYL CITRATE (PF) 100 MCG/2ML IJ SOLN: INTRAMUSCULAR | Status: DC | PRN

## 2015-04-26 MED ORDER — ONDANSETRON HCL 4 MG/2ML IJ SOLN
INTRAMUSCULAR | Status: DC | PRN
  Administered 2015-04-26: 4 mg via INTRAVENOUS

## 2015-04-26 MED ORDER — LIDOCAINE HCL (CARDIAC) 20 MG/ML IV SOLN
INTRAVENOUS | Status: DC | PRN
  Administered 2015-04-26: 30 mg via INTRAVENOUS

## 2015-04-26 MED ORDER — PROPOFOL 10 MG/ML IV BOLUS
INTRAVENOUS | Status: DC | PRN
  Administered 2015-04-26: 120 mg via INTRAVENOUS

## 2015-04-26 MED ORDER — ROCURONIUM BROMIDE 100 MG/10ML IV SOLN
INTRAVENOUS | Status: DC | PRN
  Administered 2015-04-26: 15 mg via INTRAVENOUS
  Administered 2015-04-26: 10 mg via INTRAVENOUS
  Administered 2015-04-26: 35 mg via INTRAVENOUS

## 2015-04-26 MED ORDER — EPHEDRINE SULFATE 50 MG/ML IJ SOLN
INTRAMUSCULAR | Status: DC | PRN
Start: 2015-04-26 — End: 2015-04-26
  Administered 2015-04-26: 10 mg via INTRAVENOUS

## 2015-04-26 MED ORDER — PHENYLEPHRINE HCL 10 MG/ML IJ SOLN
INTRAMUSCULAR | Status: DC | PRN
  Administered 2015-04-26: 100 ug via INTRAVENOUS

## 2015-04-26 MED ORDER — HYDROMORPHONE HCL 1 MG/ML IJ SOLN
0.2500 mg | INTRAMUSCULAR | Status: DC | PRN
  Administered 2015-04-26 (×4): 0.25 mg via INTRAVENOUS

## 2015-04-26 MED ORDER — ONDANSETRON HCL 4 MG/2ML IJ SOLN
4.0000 mg | Freq: Once | INTRAMUSCULAR | Status: DC | PRN
Start: 2015-04-26 — End: 2015-04-26

## 2015-04-26 MED ORDER — LACTATED RINGERS IV SOLN
INTRAVENOUS | Status: DC | PRN
  Administered 2015-04-26: 09:00:00 via INTRAVENOUS

## 2015-04-26 MED ORDER — FENTANYL CITRATE (PF) 100 MCG/2ML IJ SOLN
INTRAMUSCULAR | Status: DC | PRN
  Administered 2015-04-26 (×3): 50 ug via INTRAVENOUS

## 2015-04-26 MED ORDER — HYDROMORPHONE HCL 1 MG/ML IJ SOLN
INTRAMUSCULAR | Status: AC
  Administered 2015-04-26: 0.25 mg via INTRAVENOUS
  Filled 2015-04-26: qty 1

## 2015-04-26 MED ORDER — GLYCOPYRROLATE 0.2 MG/ML IJ SOLN
INTRAMUSCULAR | Status: DC | PRN
  Administered 2015-04-26: .8 mg via INTRAVENOUS

## 2015-04-26 SURGICAL SUPPLY — 35 items
APPLIER CLIP ROT 10 11.4 M/L (STAPLE) ×3
BLADE SURG SZ11 CARB STEEL (BLADE) ×3 IMPLANT
CANISTER SUCT 1200ML W/VALVE (MISCELLANEOUS) ×3 IMPLANT
CATH REDDICK CHOLANGI 4FR 50CM (CATHETERS) ×3 IMPLANT
CHLORAPREP W/TINT 26ML (MISCELLANEOUS) ×3 IMPLANT
CLIP APPLIE ROT 10 11.4 M/L (STAPLE) ×1 IMPLANT
CLIP LIGATING HEM O LOK PURPLE (MISCELLANEOUS) ×3 IMPLANT
CLOSURE WOUND 1/2 X4 (GAUZE/BANDAGES/DRESSINGS) ×1
DRAPE SHEET LG 3/4 BI-LAMINATE (DRAPES) ×3 IMPLANT
ENDOLOOP SUT PDS II  0 18 (SUTURE)
ENDOLOOP SUT PDS II 0 18 (SUTURE) IMPLANT
ENDOPOUCH RETRIEVER 10 (MISCELLANEOUS) ×3 IMPLANT
GLOVE BIO SURGEON STRL SZ7.5 (GLOVE) ×3 IMPLANT
GOWN STRL REUS W/ TWL LRG LVL3 (GOWN DISPOSABLE) ×3 IMPLANT
GOWN STRL REUS W/TWL LRG LVL3 (GOWN DISPOSABLE) ×6
GRASPER SUT TROCAR 14GX15 (MISCELLANEOUS) ×3 IMPLANT
IRRIGATION STRYKERFLOW (MISCELLANEOUS) ×1 IMPLANT
IRRIGATOR STRYKERFLOW (MISCELLANEOUS) ×3
IV NS 1000ML (IV SOLUTION) ×2
IV NS 1000ML BAXH (IV SOLUTION) ×1 IMPLANT
KIT RM TURNOVER STRD PROC AR (KITS) ×3 IMPLANT
LABEL OR SOLS (LABEL) IMPLANT
NEEDLE INSUFFLATION 150MM (ENDOMECHANICALS) ×3 IMPLANT
NS IRRIG 500ML POUR BTL (IV SOLUTION) ×3 IMPLANT
PACK LAP CHOLECYSTECTOMY (MISCELLANEOUS) ×3 IMPLANT
PAD GROUND ADULT SPLIT (MISCELLANEOUS) ×3 IMPLANT
SCISSORS METZENBAUM CVD 33 (INSTRUMENTS) ×3 IMPLANT
SLEEVE ENDOPATH XCEL 5M (ENDOMECHANICALS) ×6 IMPLANT
STRIP CLOSURE SKIN 1/2X4 (GAUZE/BANDAGES/DRESSINGS) ×2 IMPLANT
STRIP SUTURE WOUND CLOSURE 1/2 (SUTURE) ×3 IMPLANT
SUT MON AB 5-0 P3 18 (SUTURE) ×3 IMPLANT
SUT VICRYL PLUS ABS 0 54 (SUTURE) ×3 IMPLANT
TROCAR XCEL NON-BLD 11X100MML (ENDOMECHANICALS) ×3 IMPLANT
TROCAR XCEL NON-BLD 5MMX100MML (ENDOMECHANICALS) ×3 IMPLANT
TUBING INSUFFLATOR HI FLOW (MISCELLANEOUS) ×3 IMPLANT

## 2015-04-26 NOTE — Transfer of Care (Signed)
Immediate Anesthesia Transfer of Care Note  Patient: Bethany Flores  Procedure(s) Performed: Procedure(s): LAPAROSCOPIC CHOLECYSTECTOMY WITH INTRAOPERATIVE CHOLANGIOGRAM (N/A)  Patient Location: PACU  Anesthesia Type:General  Level of Consciousness: sedated and patient cooperative  Airway & Oxygen Therapy: Patient Spontanous Breathing and Patient connected to face mask oxygen  Post-op Assessment: Report given to RN and Post -op Vital signs reviewed and stable  Post vital signs: Reviewed and stable  Last Vitals:  Filed Vitals:   04/26/15 1039  BP: 99/74  Pulse: 80  Temp: 37.1 C  Resp: 16    Complications: No apparent anesthesia complications

## 2015-04-26 NOTE — Anesthesia Procedure Notes (Signed)
Procedure Name: Intubation Performed by: Baylen Dea Pre-anesthesia Checklist: Patient identified, Emergency Drugs available, Suction available, Patient being monitored and Timeout performed Patient Re-evaluated:Patient Re-evaluated prior to inductionOxygen Delivery Method: Circle system utilized Preoxygenation: Pre-oxygenation with 100% oxygen Intubation Type: IV induction Ventilation: Mask ventilation without difficulty Laryngoscope Size: Mac and 3 Grade View: Grade I Tube type: Oral Tube size: 7.0 mm Number of attempts: 2 Airway Equipment and Method: Stylet Placement Confirmation: ETT inserted through vocal cords under direct vision Secured at: 20 cm Tube secured with: Tape Dental Injury: Teeth and Oropharynx as per pre-operative assessment  Comments: DL x 1 ett placed in esophagus.  Added stylet, grade 1 view  DL #2 successful.  +BS +ETCO2 taped at 20cm

## 2015-04-26 NOTE — Anesthesia Postprocedure Evaluation (Signed)
  Anesthesia Post-op Note  Patient: Bethany Flores  Procedure(s) Performed: Procedure(s): LAPAROSCOPIC CHOLECYSTECTOMY WITH INTRAOPERATIVE CHOLANGIOGRAM (N/A)  Anesthesia type:General  Patient location: PACU  Post pain: Pain level controlled  Post assessment: Post-op Vital signs reviewed, Patient's Cardiovascular Status Stable, Respiratory Function Stable, Patent Airway and No signs of Nausea or vomiting  Post vital signs: Reviewed and stable  Last Vitals:  Filed Vitals:   04/26/15 1345  BP: 97/61  Pulse: 65  Temp: 36.7 C  Resp: 16    Level of consciousness: awake, alert  and patient cooperative  Complications: No apparent anesthesia complications

## 2015-04-26 NOTE — Anesthesia Preprocedure Evaluation (Addendum)
Anesthesia Evaluation  Patient identified by MRN, date of birth, ID band Patient awake    Reviewed: Allergy & Precautions, NPO status , Patient's Chart, lab work & pertinent test results  History of Anesthesia Complications Negative for: history of anesthetic complications  Airway Mallampati: II  TM Distance: >3 FB Neck ROM: Full    Dental  (+) Teeth Intact   Pulmonary          Cardiovascular     Neuro/Psych Depression    GI/Hepatic GERD- (not medicted, not frequently)  ,  Endo/Other    Renal/GU      Musculoskeletal   Abdominal   Peds  Hematology   Anesthesia Other Findings   Reproductive/Obstetrics                           Anesthesia Physical Anesthesia Plan  ASA: II  Anesthesia Plan: General   Post-op Pain Management:    Induction: Intravenous  Airway Management Planned: Oral ETT  Additional Equipment:   Intra-op Plan:   Post-operative Plan:   Informed Consent: I have reviewed the patients History and Physical, chart, labs and discussed the procedure including the risks, benefits and alternatives for the proposed anesthesia with the patient or authorized representative who has indicated his/her understanding and acceptance.     Plan Discussed with:   Anesthesia Plan Comments:         Anesthesia Quick Evaluation

## 2015-04-26 NOTE — Op Note (Signed)
Laparoscopic Cholecystectomy Operative Note  Preoperative Diagnosis: Acute cholecystitis  Postoperative Diagnosis: Same  Operation Performed: Laparoscopic Cholecystectomy  Surgeon: Laverle Patter., M.D.  Anesthesia: General Endotracheal  Date of Procedure: 04/26/2015   Procedure in Detail:  The patient was seen again in the Holding Room. The risks (including the possibility of adjacent organ injury, the necessity of converting to an open procedure, and the 1/200 risk of common bile duct injury - which can have quite severe implications), potential benefits, non-surgical treatment options, and expected outcomes were again reviewed with the patient. The patient concurred with the proposed plan and agreed to proceed, giving informed consent.   Prior to the induction of anesthesia, antibiotic prophylaxis was administered. The patient was placed supine on the OR table and prepped and draped in the usual sterile fashion.   A Veress needle was used to create a 41mm Hg CO2 pneumoperitoneum in the infraumbilical position and this was replaced with a 85mm trocar and a 30 degree angled laparoscope. Remaining trocars were placed under direct visualization. The fundus of the gallbladder was retracted ventrally and cephalad. Adhesions to the visceral surface of the gallbladder were divided with the electrocautery. The infundibulum was grasped and retracted laterally, opening up the triangle of Calot. The cystic duct was clearly identified and doubly ligated with hemo-lock clips and divided. The cystic artery was dissected free, doubly ligated with clips and divided as well.   The gallbladder was dissected from the liver bed in retrograde fashion with the electrocautery. The gallbladder was placed in an Endocatch sac and removed from the abdomen via the epigastric port. The liver bed was irrigated and suctioned clear. Hemostasis was excellent, there was no evidence of bile staining, and the clips were secure.  The patient's pneumoperitoneum was desufflated and decannulated.  All four skin sites were closed with subcuticular 4-0 Monocryl and Suture Strips.   Findings; Adhesions / Inflammation: Inflamed, extremely edematous gallbladder.   Estimated Blood Loss: Minimal         Specimens: Gallbladder           Complications: None; the patient tolerated the procedure well.

## 2015-04-27 LAB — SURGICAL PATHOLOGY

## 2015-04-27 MED ORDER — FAMOTIDINE 20 MG PO TABS: 20.0000 mg | ORAL_TABLET | Freq: Two times a day (BID) | ORAL | Status: DC

## 2015-04-27 NOTE — Discharge Summary (Signed)
Patient ID: Bethany Flores MRN: [id number] DOB/AGE: 47-Oct-1967 47 y.o.  Admit date: 04/25/2015 Discharge date: 04/27/2015  Discharge Diagnoses:  Acute cholecystitis  Procedures Performed: Laparoscopic cholecystectomy  Discharged Condition: good  Hospital Course: The patient recovered nicely and was discharged home on postoperative day 1.  Discharge Orders:   Disposition: Final discharge disposition not confirmed  Discharge Medications:  Current facility-administered medications:  .  acetaminophen (TYLENOL) tablet 650 mg, 650 mg, Oral, Q6H PRN **OR** acetaminophen (TYLENOL) suppository 650 mg, 650 mg, Rectal, Q6H PRN, Molly Maduro, MD .  ampicillin-sulbactam (UNASYN) 1.5 g in sodium chloride 0.9 % 50 mL IVPB, 1.5 g, Intravenous, Q6H, Molly Maduro, MD, 1.5 g at 04/27/15 0330 .  dextrose 5 % and 0.45 % NaCl with KCl 20 mEq/L infusion, , Intravenous, Continuous, Molly Maduro, MD, Last Rate: 125 mL/hr at 04/27/15 6378 .  diphenhydrAMINE (BENADRYL) injection 12.5 mg, 12.5 mg, Intravenous, Q6H PRN **OR** diphenhydrAMINE (BENADRYL) 12.5 MG/5ML elixir 12.5 mg, 12.5 mg, Oral, Q6H PRN, Molly Maduro, MD .  famotidine (PEPCID) tablet 20 mg, 20 mg, Oral, BID, Molly Maduro, MD .  morphine 2 MG/ML injection 2-6 mg, 2-6 mg, Intravenous, Q2H PRN, Molly Maduro, MD, 2 mg at 04/26/15 1727 .  promethazine (PHENERGAN) tablet 25 mg, 25 mg, Oral, Q6H PRN **OR** promethazine (PHENERGAN) injection 25 mg, 25 mg, Intravenous, Q6H PRN, Molly Maduro, MD .  sertraline (ZOLOFT) tablet 50 mg, 50 mg, Oral, Daily, Molly Maduro, MD, 50 mg at 04/25/15 1509  Follwup: Follow-up Information    Follow up with [facility] In 3 weeks.   Contact information:   [address] [place] [address] [phone]      Signed: Consuela Mimes 04/27/2015, 9:56 AM

## 2015-04-27 NOTE — Progress Notes (Signed)
1 Day Post-Op   Subjective:  Feels great. No nausea or pain. Just a little soreness. Tolerating a diet.  Vital signs in last 24 hours: Temp:  [98 F (36.7 C)-99.5 F (37.5 C)] 98.1 F (36.7 C) (06/09 0833) Pulse Rate:  [59-102] 66 (06/09 0833) Resp:  [12-20] 18 (06/09 0833) BP: (79-113)/(46-74) 102/60 mmHg (06/09 0833) SpO2:  [97 %-100 %] 100 % (06/09 0833) Last BM Date: 04/24/15  Intake/Output from previous day: 06/08 0701 - 06/09 0700 In: 3341.6 [P.O.:50; I.V.:3291.6] Out: 3550 [Urine:3550]  GI: soft, non-tender; bowel sounds normal; no masses,  no organomegaly  Lab Results:  CBC  Recent Labs  04/25/15 0835 04/26/15 0430  WBC 7.9 6.1  HGB 12.8 11.2*  HCT 38.8 33.8*  PLT 290 228   CMP     Component Value Date/Time   NA 141 04/26/2015 0430   NA 139 05/27/2013 0820   K 3.8 04/26/2015 0430   K 4.3 05/27/2013 0820   CL 106 04/26/2015 0430   CL 107 05/27/2013 0820   CO2 29 04/26/2015 0430   CO2 28 05/27/2013 0820   GLUCOSE 94 04/26/2015 0430   GLUCOSE 88 05/27/2013 0820   BUN 8 04/26/2015 0430   BUN 17 05/27/2013 0820   CREATININE 0.67 04/26/2015 0430   CREATININE 0.85 05/27/2013 0820   CALCIUM 8.4* 04/26/2015 0430   CALCIUM 8.5 05/27/2013 0820   PROT 6.2* 04/26/2015 0430   PROT 7.7 05/27/2013 0820   ALBUMIN 3.4* 04/26/2015 0430   ALBUMIN 3.6 05/27/2013 0820   AST 17 04/26/2015 0430   AST 17 05/27/2013 0820   ALT 14 04/26/2015 0430   ALT 19 05/27/2013 0820   ALKPHOS 65 04/26/2015 0430   ALKPHOS 117 05/27/2013 0820   BILITOT 1.4* 04/26/2015 0430   GFRNONAA >60 04/26/2015 0430   GFRNONAA >60 05/27/2013 0820   GFRAA >60 04/26/2015 0430   GFRAA >60 05/27/2013 0820   PT/INR No results for input(s): LABPROT, INR in the last 72 hours.  Studies/Results: Ct Abdomen Pelvis W Contrast  04/25/2015   CLINICAL DATA:  49 year old female with epigastric pain since midnight. Dry heaves. Initial encounter. 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  EXAM: CT  ABDOMEN AND PELVIS WITH CONTRAST  TECHNIQUE: Multidetector CT imaging of the abdomen and pelvis was performed using the standard protocol following bolus administration of intravenous contrast.  CONTRAST:  198mL OMNIPAQUE IOHEXOL 300 MG/ML  SOLN  COMPARISON:  Right upper quadrant ultrasound 05/27/2013.  FINDINGS: Minor dependent atelectasis.  No pericardial or pleural effusion.  Degenerative changes in the lumbar spine. No acute osseous abnormality identified.  Small volume pelvic free fluid. Numerous pelvic phleboliths. Uterus and adnexa within normal limits. Unremarkable bladder. Negative rectum. Decompressed redundant sigmoid colon.  Negative left colon aside from retained stool. Retained stool also throughout the transverse and right colon. Normal appendix. Oral contrast has just reached the ileocecal valve. Negative terminal ileum. No dilated small bowel. Postoperative changes to the greater curve of the stomach. The stomach is largely decompressed. Negative duodenum.  Spleen, pancreas and adrenal glands are within normal limits. Portal venous system is patent. Major arterial structures are patent. No abdominal free fluid. Renal enhancement and contrast excretion within normal limits.  Indistinctness and thickening of the gallbladder wall with cholelithiasis. Mildly prominent central intrahepatic biliary tree or cystic duct, but no other intra or extrahepatic biliary ductal enlargement. There may be mild hyper enhancement of the liver parenchyma at the gallbladder fossa. Otherwise negative liver.  IMPRESSION: 1.  Abnormal CT appearance of the gallbladder highly suspicious for acute cholecystitis. If necessary right upper quadrant ultrasound may be confirmatory. 2. No other acute or inflammatory process identified in the abdomen. 3. Trace pelvic free fluid, likely physiologic.   Electronically Signed   By: Genevie Ann M.D.   On: 04/25/2015 12:43    Assessment/Plan: Discharge home

## 2015-04-27 NOTE — Discharge Instructions (Signed)
Laparoscopic Cholecystectomy Laparoscopic cholecystectomy is surgery to remove the gallbladder. The gallbladder is located in the upper right part of the abdomen, behind the liver. It is a storage sac for bile produced in the liver. Bile aids in the digestion and absorption of fats. Cholecystectomy is often done for inflammation of the gallbladder (cholecystitis). This condition is usually caused by a buildup of gallstones (cholelithiasis) in your gallbladder. Gallstones can block the flow of bile, resulting in inflammation and pain. In severe cases, emergency surgery may be required. When emergency surgery is not required, you will have time to prepare for the procedure. Laparoscopic surgery is an alternative to open surgery. Laparoscopic surgery has a shorter recovery time. Your common bile duct may also need to be examined during the procedure. If stones are found in the common bile duct, they may be removed. LET [facility] CARE PROVIDER KNOW ABOUT:  Any allergies you have.  All medicines you are taking, including vitamins, herbs, eye drops, creams, and over-the-counter medicines.  Previous problems you or members of your family have had with the use of anesthetics.  Any blood disorders you have.  Previous surgeries you have had.  Medical conditions you have. RISKS AND COMPLICATIONS Generally, this is a safe procedure. However, as with any procedure, complications can occur. Possible complications include:  Infection.  Damage to the common bile duct, nerves, arteries, veins, or other internal organs such as the stomach, liver, or intestines.  Bleeding.  A stone may remain in the common bile duct.  A bile leak from the cyst duct that is clipped when your gallbladder is removed.  The need to convert to open surgery, which requires a larger incision in the abdomen. This may be necessary if your surgeon thinks it is not safe to continue with a laparoscopic procedure. BEFORE THE  PROCEDURE  Ask your health care provider about changing or stopping any regular medicines. You will need to stop taking aspirin or blood thinners at least 5 days prior to surgery.  Do not eat or drink anything after midnight the night before surgery.  Let your health care provider know if you develop a cold or other infectious problem before surgery. PROCEDURE   You will be given medicine to make you sleep through the procedure (general anesthetic). A breathing tube will be placed in your mouth.  When you are asleep, your surgeon will make several small cuts (incisions) in your abdomen.  A thin, lighted tube with a tiny camera on the end (laparoscope) is inserted through one of the small incisions. The camera on the laparoscope sends a picture to a TV screen in the operating room. This gives the surgeon a good view inside your abdomen.  A gas will be pumped into your abdomen. This expands your abdomen so that the surgeon has more room to perform the surgery.  Other tools needed for the procedure are inserted through the other incisions. The gallbladder is removed through one of the incisions.  After the removal of your gallbladder, the incisions will be closed with stitches, staples, or skin glue. AFTER THE PROCEDURE  You will be taken to a recovery area where your progress will be checked often.  You may be allowed to go home the same day if your pain is controlled and you can tolerate liquids. Document Released: 11/04/2005 Document Revised: 08/25/2013 Document Reviewed: 06/16/2013 [facility] Patient Information 2015 [place], [place]. This information is not intended to replace advice given to you by your health care provider. Make  sure you discuss any questions you have with your health care provider. Surgical Site Infections FAQs What is a Surgical Site Infection (SSI)? A surgical site infection is an infection that occurs after surgery in the part of the body where the surgery took  place. Most patients who have surgery do not develop an infection. However, infections develop in about 1 to 3 out of every 100 patients who have surgery. Some of the common symptoms of a surgical site infection are:  Redness and pain around the area where you had surgery  Drainage of cloudy fluid from your surgical wound  Fever Can SSIs be treated? Yes. Most surgical site infections can be treated with antibiotics. The antibiotic given to you depends on the bacteria (germs) causing the infections. Sometimes patients with SSIs also need another surgery to treat the infection. What are some of the things that hospitals are doing to prevent SSIs? To prevent SSIs, doctors, nurses, and other healthcare providers:  Clean their hands and arms up to their elbows with an antiseptic agent just before the surgery.  Clean their hands with soap and water or an alcohol-based hand rub before and after caring for each patient.  May remove some of your hair immediately before your surgery using electric clippers if the hair is in the same area where the procedure will occur. They should not shave you with a razor.  Wear special hair covers, masks, gowns, and gloves during surgery to keep the surgery area clean.  Give you antibiotics before your surgery starts. In most cases, you should get antibiotics within 60 minutes before the surgery starts and the antibiotics should be stopped within 24 hours after surgery.  Clean the skin at the site of your surgery with a special soap that kills germs. What can I do to help prevent SSIs? Before your surgery:  Tell your doctor about other medical problems you may have. Health problems such as allergies, diabetes, and obesity could affect your surgery and your treatment.  Quit smoking. Patients who smoke get more infections. Talk to your doctor about how you can quit before your surgery.  Do not shave near where you will have surgery. Shaving with a razor can  irritate your skin and make it easier to develop an infection. At the time of your surgery:  Speak up if someone tries to shave you with a razor before surgery. Ask why you need to be shaved and talk with your surgeon if you have any concerns.  Ask if you will get antibiotics before surgery. After your surgery:  Make sure that your healthcare providers clean their hands before examining you, either with soap and water or an alcohol-based hand rub.  If you do not see your providers clean their hands, please ask them to do so.  Family and friends who visit you should not touch the surgical wound or dressings.  Family and friends should clean their hands with soap and water or an alcohol-based hand rub before and after visiting you. If you do not see them clean their hands, ask them to clean their hands. What do I need to do when I go home from the hospital?  Before you go home, your doctor or nurse should explain everything you need to know about taking care of your wound. Make sure you understand how to care for your wound before you leave the hospital.  Always clean your hands before and after caring for your wound.  Before you go home, make  sure you know who to contact if you have questions or problems after you get home.  If you have any symptoms of an infection, such as redness and pain at the surgery site, drainage, or fever, call your doctor immediately. If you have additional questions, please ask your doctor or nurse. Developed and co-sponsored by [employer] for [place] [phone]); Infectious Diseases Society of [place] (IDSA); [place]; Association for Professionals in Infection Control and Epidemiology (APIC); Centers for Disease Control and Prevention (CDC); and [employer]. Document Released: 11/09/2013 Document Reviewed: 11/09/2013 [facility] Patient Information 2015 [place], [place]. This information is not intended to replace  advice given to you by your health care provider. Make sure you discuss any questions you have with your health care provider.

## 2015-04-27 NOTE — Progress Notes (Signed)
  CONCERNING: IV to Oral Route Change Policy  RECOMMENDATION: This patient is receiving famotidine by the intravenous route.  Based on criteria approved by the Pharmacy and Therapeutics Committee, the intravenous medication(s) is/are being converted to the equivalent oral dose form(s).   DESCRIPTION: These criteria include:  The patient is eating (either orally or via tube) and/or has been taking other orally administered medications for a least 24 hours  The patient has no evidence of active gastrointestinal bleeding or impaired GI absorption (gastrectomy, short bowel, patient on TNA or NPO).   If you have questions about this conversion, please contact the Pharmacy Department  []   [phone] )  [facility] [x]   [phone] )  [facility] []   [phone] )  [facility] []   [phone] )  [facility] []   [phone] )  [place], PharmD Clinical Pharmacist 04/27/2015

## 2015-04-27 NOTE — Progress Notes (Signed)
Pt d/c home; d/c instructions reviewed w/ pt; pt understanding was verbalized; IV removed catheter in tact, gauze dressing applied; all pt questions answered; pt left unit via wheelchair accompanied by staff 

## 2015-05-23 ENCOUNTER — Ambulatory Visit: Payer: Self-pay | Admitting: Surgery

## 2015-05-24 ENCOUNTER — Ambulatory Visit (INDEPENDENT_AMBULATORY_CARE_PROVIDER_SITE_OTHER): Payer: BLUE CROSS/BLUE SHIELD | Admitting: Surgery

## 2015-05-24 ENCOUNTER — Ambulatory Visit: Payer: Self-pay | Admitting: Surgery

## 2015-05-24 ENCOUNTER — Encounter: Payer: Self-pay | Admitting: Surgery

## 2015-05-24 VITALS — BP 116/74 | HR 73 | Temp 98.6°F | Resp 18 | Ht 65.0 in | Wt 154.0 lb

## 2015-05-24 DIAGNOSIS — K8010 Calculus of gallbladder with chronic cholecystitis without obstruction: Secondary | ICD-10-CM

## 2015-05-24 NOTE — Progress Notes (Signed)
Outpatient postop visit  05/24/2015  Bethany Flores is an 49 y.o. female.    Procedure: Laparoscopic cholecystectomy with grams  CC: 1 month postop no problems  HPI: Patient states she took no pain medicines after her surgery she needs no pain medicine now she is tolerating a regular diet she is almost a month out from surgery. She had some loose stools which were essentially normal for her following her gastric surgery.  Medications reviewed.    Physical Exam:  BP 116/74 mmHg  Pulse 73  Temp(Src) 98.6 F (37 C) (Oral)  Resp 18  Ht 5\' 5"  (1.651 m)  Wt 154 lb (69.854 kg)  BMI 25.63 kg/m2  LMP 05/12/2015    PE: No scleral icterus no jaundice soft nontender abdomen wounds healing with no erythema or drainage    Assessment/Plan:  Pathology personally reviewed negative for malignancy. Patient doing very well recommend follow up on an as-needed basis.  Florene Glen, MD, FACS

## 2015-08-18 ENCOUNTER — Ambulatory Visit (INDEPENDENT_AMBULATORY_CARE_PROVIDER_SITE_OTHER): Payer: BLUE CROSS/BLUE SHIELD | Admitting: Sports Medicine

## 2015-08-18 ENCOUNTER — Encounter: Payer: Self-pay | Admitting: Sports Medicine

## 2015-08-18 VITALS — BP 116/72 | HR 64 | Ht 66.0 in | Wt 153.0 lb

## 2015-08-18 DIAGNOSIS — S76311A Strain of muscle, fascia and tendon of the posterior muscle group at thigh level, right thigh, initial encounter: Secondary | ICD-10-CM | POA: Diagnosis not present

## 2015-08-18 NOTE — Assessment & Plan Note (Signed)
Right mid hamstring strain during 1/2 marathon training.  Pain only 8 miles in.  No acute injury.  Most consistent with grade 1 strain.  - Discussed hamstring rehab - Given thigh compression sleeve. - avoid overstriding and be caustious with sprints.  - f/u PRN

## 2015-08-18 NOTE — Progress Notes (Signed)
  Bethany Flores - 49 y.o. female MRN [id number]  Date of birth: Oct 28, 1966  CC: Right hamstring pain  SUBJECTIVE:   HPI  Right hamstring: - Reproducibly at mile 8. Feels like a twisting in mid right hamstring. Sometimes will radiate to proximal hamstring insertion.  - No previous hamstring issues.  - Really bad at mile 12.   - Training for 1/2 marathon in early [place] in early November. This will be her 2nd 1/2 marathon in this year.  - Speed Work doesn't bother her.  - Works with [employer].  - Trying stretching, ice,  ibu, heat, foam roller.  - A lot worse this week, although she has been able to run.  - No snap or pop at any point.     Right IT band issues have resolved.  ROS:     14 point RoS negative other than that listed in HPI.    HISTORY: Past Medical, Surgical, Social, and Family History Reviewed & Updated per EMR.  Pertinent Historical Findings include: no significant changes.   OBJECTIVE: BP 116/72 mmHg  Pulse 64  Ht 5\' 6"  (1.676 m)  Wt 153 lb (69.4 kg)  BMI 24.71 kg/m2  Physical Exam  NAD, cooperative. Calm, non-labored breathing.    Knee: b/l Normal to inspection with no erythema or effusion or obvious bony abnormalities. Palpation normal with no warmth or joint line tenderness or patellar tenderness or condyle tenderness. ROM normal in flexion and extension and lower leg rotation. Ligaments with solid consistent endpoints including ACL, PCL, LCL, MCL. Negative Mcmurray's and provocative meniscal tests. Non painful patellar compression. Patellar  tendons unremarkable. Right mid body of right medial quad is tender. No tenderness over proximal origin or distal insertions. No bruising.  Quadriceps strength is normal. Hamstring strength 5/5 although some pain at 30 flexion with external tibial rotation.    MEDICATIONS, LABS & OTHER ORDERS: Previous Medications   ACETAMINOPHEN (TYLENOL) 325 MG TABLET    Take 650 mg by mouth every 6 (six) hours as needed for  moderate pain.   CALCIUM CARBONATE-VITAMIN D 600-200 MG-UNIT TABS    Take 1 tablet by mouth daily.   FERROUS SULFATE 325 (65 FE) MG TABLET    Take 325 mg by mouth daily with breakfast.   IBUPROFEN (ADVIL,MOTRIN) 200 MG TABLET    Take 400 mg by mouth every 6 (six) hours as needed for moderate pain.    MULTIPLE VITAMINS-MINERALS (MULTIVITAMIN ADULT) TABS    Take 1 tablet by mouth daily.   SERTRALINE (ZOLOFT) 50 MG TABLET    Take 50 mg by mouth daily.   Modified Medications   No medications on file   New Prescriptions   No medications on file   Discontinued Medications   No medications on file  No orders of the defined types were placed in this encounter.   ASSESSMENT & PLAN: See problem based charting & AVS for pt instructions.

## 2015-09-12 ENCOUNTER — Inpatient Hospital Stay: Payer: BLUE CROSS/BLUE SHIELD

## 2015-09-12 ENCOUNTER — Inpatient Hospital Stay: Payer: BLUE CROSS/BLUE SHIELD | Attending: Oncology | Admitting: Oncology

## 2015-09-12 VITALS — BP 114/74 | HR 59 | Temp 97.8°F | Resp 16 | Wt 163.6 lb

## 2015-09-12 DIAGNOSIS — R5383 Other fatigue: Secondary | ICD-10-CM | POA: Insufficient documentation

## 2015-09-12 DIAGNOSIS — F329 Major depressive disorder, single episode, unspecified: Secondary | ICD-10-CM | POA: Diagnosis not present

## 2015-09-12 DIAGNOSIS — D509 Iron deficiency anemia, unspecified: Secondary | ICD-10-CM | POA: Diagnosis present

## 2015-09-12 DIAGNOSIS — Z79899 Other long term (current) drug therapy: Secondary | ICD-10-CM

## 2015-09-12 DIAGNOSIS — F419 Anxiety disorder, unspecified: Secondary | ICD-10-CM | POA: Diagnosis not present

## 2015-09-12 LAB — IRON AND TIBC
Iron: 27 ug/dL — ABNORMAL LOW (ref 28–170)
Saturation Ratios: 7 % — ABNORMAL LOW (ref 10.4–31.8)
TIBC: 416 ug/dL (ref 250–450)
UIBC: 389 ug/dL

## 2015-09-12 LAB — VITAMIN B12: Vitamin B-12: 338 pg/mL (ref 180–914)

## 2015-09-12 LAB — CBC
HEMATOCRIT: 35.3 % (ref 35.0–47.0)
Hemoglobin: 11.6 g/dL — ABNORMAL LOW (ref 12.0–16.0)
MCH: 27.4 pg (ref 26.0–34.0)
MCHC: 32.7 g/dL (ref 32.0–36.0)
MCV: 83.6 fL (ref 80.0–100.0)
Platelets: 306 10*3/uL (ref 150–440)
RBC: 4.22 MIL/uL (ref 3.80–5.20)
RDW: 14.8 % — ABNORMAL HIGH (ref 11.5–14.5)
WBC: 5.1 10*3/uL (ref 3.6–11.0)

## 2015-09-12 LAB — FOLATE: FOLATE: 24 ng/mL (ref >5.9)

## 2015-09-12 LAB — FERRITIN: Ferritin: 11 ng/mL (ref 11–307)

## 2015-09-12 NOTE — Progress Notes (Signed)
Patient has been feeling fatigued but attributed this to her training for 1/2 marathon.  When she went to donate blood 2 weeks ago her hemoglobin was 11.1 so she was not able to donate and would like a recheck with Dr. Grayland Ormond.

## 2015-09-18 DIAGNOSIS — D509 Iron deficiency anemia, unspecified: Secondary | ICD-10-CM | POA: Insufficient documentation

## 2015-09-18 NOTE — Progress Notes (Signed)
[place]  [phone] [phone]) [phone]  ID: Bethany Flores OB: July 15, 1966  MR#: [id number]  EGB#:[id number]  Patient Care Team: Bing Quarry, MD as PCP - General (Family Medicine)  CHIEF COMPLAINT:  Chief Complaint  Patient presents with  . Anemia    INTERVAL HISTORY: Patient is a 49 year old female who has been feeling increased weakness and fatigue over the past several months, but it should be repeated this 2 training for half marathon. She went to donate blood 2 weeks ago and was turned away secondary to anemia. She is referred for further evaluation and treatment. She otherwise feels well and is asymptomatic. She has no neurologic complaint. She denies any recent fevers. She has good appetite and denies weight loss. She has no chest pain or shortness of breath. She denies any nausea, vomiting, constipation, or diarrhea. She has no urinary complaints. Patient otherwise feels well and offers no further specific complaints.  REVIEW OF SYSTEMS:   Review of Systems  Constitutional: Positive for malaise/fatigue. Negative for fever and weight loss.  Respiratory: Negative.   Cardiovascular: Negative.   Gastrointestinal: Negative.  Negative for blood in stool and melena.  Musculoskeletal: Negative.   Neurological: Positive for weakness.    As per HPI. Otherwise, a complete review of systems is negatve.  PAST MEDICAL HISTORY: Past Medical History  Diagnosis Date  . Anxiety   . Depression     PAST SURGICAL HISTORY: Past Surgical History  Procedure Laterality Date  . Laparoscopic gastric sleeve resection    . Cholecystectomy N/A 04/26/2015    Procedure: LAPAROSCOPIC CHOLECYSTECTOMY WITH INTRAOPERATIVE CHOLANGIOGRAM;  Surgeon: Molly Maduro, MD;  Location: [facility] ORS;  Service: General;  Laterality: N/A;    FAMILY HISTORY Family History  Problem Relation Age of Onset  . Diabetes Paternal Grandmother   . Diabetes Paternal Grandfather         ADVANCED DIRECTIVES:    HEALTH MAINTENANCE: Social History  Substance Use Topics  . Smoking status: Never Smoker   . Smokeless tobacco: Never Used  . Alcohol Use: 0.0 oz/week    0 Standard drinks or equivalent per week     Comment: occasionally     Colonoscopy:  PAP:  Bone density:  Lipid panel:  No Known Allergies  Current Outpatient Prescriptions  Medication Sig Dispense Refill  . acetaminophen (TYLENOL) 325 MG tablet Take 650 mg by mouth every 6 (six) hours as needed for moderate pain.    . Calcium Carbonate-Vitamin D 600-200 MG-UNIT TABS Take 1 tablet by mouth daily.    Marland Kitchen ibuprofen (ADVIL,MOTRIN) 200 MG tablet Take 400 mg by mouth every 6 (six) hours as needed for moderate pain.     . Multiple Vitamins-Minerals (MULTIVITAMIN ADULT) TABS Take 1 tablet by mouth daily.    . sertraline (ZOLOFT) 50 MG tablet Take 50 mg by mouth daily.    . ferrous sulfate 325 (65 FE) MG tablet Take 325 mg by mouth daily with breakfast.     No current facility-administered medications for this visit.    OBJECTIVE: Filed Vitals:   09/12/15 1203  BP: 114/74  Pulse: 59  Temp: 97.8 F (36.6 C)  Resp: 16     Body mass index is 26.42 kg/(m^2).    ECOG FS:0 - Asymptomatic  General: Well-developed, well-nourished, no acute distress. Eyes: Pink conjunctiva, anicteric sclera. HEENT: Normocephalic, moist mucous membranes, clear oropharnyx. Lungs: Clear to auscultation bilaterally. Heart: Regular rate and rhythm. No rubs, murmurs, or gallops. Abdomen: Soft, nontender, nondistended. No organomegaly  noted, normoactive bowel sounds. Musculoskeletal: No edema, cyanosis, or clubbing. Neuro: Alert, answering all questions appropriately. Cranial nerves grossly intact. Skin: No rashes or petechiae noted. Psych: Normal affect. Lymphatics: No cervical, calvicular, axillary or inguinal LAD.   LAB RESULTS:  Lab Results  Component Value Date   NA 141 04/26/2015   K 3.8 04/26/2015   CL 106  04/26/2015   CO2 29 04/26/2015   GLUCOSE 94 04/26/2015   BUN 8 04/26/2015   CREATININE 0.67 04/26/2015   CALCIUM 8.4* 04/26/2015   PROT 6.2* 04/26/2015   ALBUMIN 3.4* 04/26/2015   AST 17 04/26/2015   ALT 14 04/26/2015   ALKPHOS 65 04/26/2015   BILITOT 1.4* 04/26/2015   GFRNONAA >60 04/26/2015   GFRAA >60 04/26/2015    Lab Results  Component Value Date   WBC 5.1 09/12/2015   NEUTROABS 5.3 04/25/2015   HGB 11.6* 09/12/2015   HCT 35.3 09/12/2015   MCV 83.6 09/12/2015   PLT 306 09/12/2015     STUDIES: No results found.  ASSESSMENT: Iron deficiency anemia.  PLAN:    1. Iron deficiency anemia:  Patient's hemoglobin is only mildly decreased, but she is significantly reduced iron stores. She will benefit from IV iron. Patient will return to clinic in 1 and 2 weeks to receive 510 mg Feraheme. She has been instructed to continue her oral iron supplementation.  Return to clinic in 3 months for further evaluation.  Patient expressed understanding and was in agreement with this plan. She also understands that She can call clinic at any time with any questions, concerns, or complaints.   No matching staging information was found for the patient.  Lloyd Huger, MD   09/18/2015 4:47 PM

## 2015-09-20 ENCOUNTER — Inpatient Hospital Stay: Payer: BLUE CROSS/BLUE SHIELD | Attending: Oncology

## 2015-09-20 VITALS — BP 132/78 | HR 60 | Resp 20

## 2015-09-20 DIAGNOSIS — Z79899 Other long term (current) drug therapy: Secondary | ICD-10-CM | POA: Diagnosis not present

## 2015-09-20 DIAGNOSIS — D509 Iron deficiency anemia, unspecified: Secondary | ICD-10-CM | POA: Insufficient documentation

## 2015-09-20 MED ORDER — SODIUM CHLORIDE 0.9 % IV SOLN
Freq: Once | INTRAVENOUS | Status: AC
  Administered 2015-09-20: 15:00:00 via INTRAVENOUS
  Filled 2015-09-20: qty 1000

## 2015-09-20 MED ORDER — SODIUM CHLORIDE 0.9 % IV SOLN
510.0000 mg | Freq: Once | INTRAVENOUS | Status: AC
Start: 2015-09-20 — End: 2015-09-20
  Administered 2015-09-20: 510 mg via INTRAVENOUS
  Filled 2015-09-20: qty 17

## 2015-09-27 ENCOUNTER — Inpatient Hospital Stay: Payer: BLUE CROSS/BLUE SHIELD

## 2015-09-27 VITALS — BP 117/73 | HR 69 | Resp 20

## 2015-09-27 DIAGNOSIS — D509 Iron deficiency anemia, unspecified: Secondary | ICD-10-CM | POA: Diagnosis not present

## 2015-09-27 MED ORDER — SODIUM CHLORIDE 0.9 % IV SOLN
Freq: Once | INTRAVENOUS | Status: AC
  Administered 2015-09-27: 14:00:00 via INTRAVENOUS
  Filled 2015-09-27: qty 1000

## 2015-09-27 MED ORDER — ALTEPLASE 2 MG IJ SOLR: 2.0000 mg | Freq: Once | INTRAMUSCULAR | Status: DC | PRN

## 2015-09-27 MED ORDER — SODIUM CHLORIDE 0.9 % IJ SOLN
10.0000 mL | INTRAMUSCULAR | Status: DC | PRN
  Filled 2015-09-27: qty 10

## 2015-09-27 MED ORDER — HEPARIN SOD (PORK) LOCK FLUSH 100 UNIT/ML IV SOLN: 500.0000 [IU] | Freq: Once | INTRAVENOUS | Status: DC | PRN

## 2015-09-27 MED ORDER — FERUMOXYTOL INJECTION 510 MG/17 ML
510.0000 mg | Freq: Once | INTRAVENOUS | Status: AC
  Administered 2015-09-27: 510 mg via INTRAVENOUS
  Filled 2015-09-27: qty 17

## 2015-09-27 MED ORDER — SODIUM CHLORIDE 0.9 % IJ SOLN
3.0000 mL | Freq: Once | INTRAMUSCULAR | Status: DC | PRN
  Filled 2015-09-27: qty 10

## 2015-09-27 MED ORDER — HEPARIN SOD (PORK) LOCK FLUSH 100 UNIT/ML IV SOLN: 250.0000 [IU] | Freq: Once | INTRAVENOUS | Status: DC | PRN

## 2015-10-18 ENCOUNTER — Other Ambulatory Visit: Payer: Self-pay | Admitting: Family Medicine

## 2015-10-18 DIAGNOSIS — Z1231 Encounter for screening mammogram for malignant neoplasm of breast: Secondary | ICD-10-CM

## 2015-11-16 ENCOUNTER — Other Ambulatory Visit: Payer: Self-pay | Admitting: Nurse Practitioner

## 2015-11-16 ENCOUNTER — Ambulatory Visit
Admission: RE | Admit: 2015-11-16 | Discharge: 2015-11-16 | Disposition: A | Discharge status: Home / Self Care | Payer: BLUE CROSS/BLUE SHIELD | Source: Ambulatory Visit | Attending: Family Medicine | Admitting: Family Medicine

## 2015-11-16 DIAGNOSIS — Z1231 Encounter for screening mammogram for malignant neoplasm of breast: Secondary | ICD-10-CM | POA: Diagnosis not present

## 2015-12-22 ENCOUNTER — Other Ambulatory Visit: Payer: Self-pay | Admitting: Chiropractic Medicine

## 2015-12-22 DIAGNOSIS — R52 Pain, unspecified: Secondary | ICD-10-CM

## 2015-12-27 ENCOUNTER — Other Ambulatory Visit: Payer: Self-pay | Admitting: *Deleted

## 2015-12-27 DIAGNOSIS — D509 Iron deficiency anemia, unspecified: Secondary | ICD-10-CM

## 2015-12-28 ENCOUNTER — Inpatient Hospital Stay (HOSPITAL_BASED_OUTPATIENT_CLINIC_OR_DEPARTMENT_OTHER): Payer: BLUE CROSS/BLUE SHIELD | Admitting: Oncology

## 2015-12-28 ENCOUNTER — Inpatient Hospital Stay: Payer: BLUE CROSS/BLUE SHIELD | Attending: Oncology

## 2015-12-28 ENCOUNTER — Inpatient Hospital Stay: Payer: BLUE CROSS/BLUE SHIELD

## 2015-12-28 VITALS — BP 102/68 | HR 69 | Temp 99.4°F | Resp 16 | Wt 164.5 lb

## 2015-12-28 DIAGNOSIS — M79606 Pain in leg, unspecified: Secondary | ICD-10-CM | POA: Diagnosis not present

## 2015-12-28 DIAGNOSIS — F418 Other specified anxiety disorders: Secondary | ICD-10-CM

## 2015-12-28 DIAGNOSIS — D509 Iron deficiency anemia, unspecified: Secondary | ICD-10-CM | POA: Diagnosis present

## 2015-12-28 DIAGNOSIS — Z79899 Other long term (current) drug therapy: Secondary | ICD-10-CM

## 2015-12-28 LAB — CBC WITH DIFFERENTIAL/PLATELET
BASOS PCT: 1 %
Basophils Absolute: 0 10*3/uL (ref 0–0.1)
EOS ABS: 0.1 10*3/uL (ref 0–0.7)
EOS PCT: 2 %
HEMATOCRIT: 38.7 % (ref 35.0–47.0)
Hemoglobin: 13.4 g/dL (ref 12.0–16.0)
Lymphocytes Relative: 35 %
Lymphs Abs: 2.3 10*3/uL (ref 1.0–3.6)
MCH: 31.3 pg (ref 26.0–34.0)
MCHC: 34.7 g/dL (ref 32.0–36.0)
MCV: 90.2 fL (ref 80.0–100.0)
MONO ABS: 0.4 10*3/uL (ref 0.2–0.9)
Monocytes Relative: 6 %
Neutro Abs: 3.8 10*3/uL (ref 1.4–6.5)
Neutrophils Relative %: 56 %
PLATELETS: 293 10*3/uL (ref 150–440)
RBC: 4.29 MIL/uL (ref 3.80–5.20)
RDW: 12.5 % (ref 11.5–14.5)
WBC: 6.7 10*3/uL (ref 3.6–11.0)

## 2015-12-28 LAB — FERRITIN: FERRITIN: 81 ng/mL (ref 11–307)

## 2015-12-28 LAB — IRON AND TIBC
Iron: 76 ug/dL (ref 28–170)
SATURATION RATIOS: 24 % (ref 10.4–31.8)
TIBC: 312 ug/dL (ref 250–450)
UIBC: 236 ug/dL

## 2015-12-30 ENCOUNTER — Ambulatory Visit
Admission: RE | Admit: 2015-12-30 | Discharge: 2015-12-30 | Disposition: A | Discharge status: Home / Self Care | Payer: BLUE CROSS/BLUE SHIELD | Source: Ambulatory Visit | Attending: Chiropractic Medicine | Admitting: Chiropractic Medicine

## 2015-12-30 DIAGNOSIS — R52 Pain, unspecified: Secondary | ICD-10-CM

## 2016-01-02 NOTE — Progress Notes (Signed)
[place]  [phone]) [phone] Fax:(336) [phone]  ID: Bethany Flores OB: 1966/07/22  MR#: [id number]  [id number]  Patient Care Team: Bing Quarry, MD as PCP - General (Family Medicine)  CHIEF COMPLAINT:  Chief Complaint  Patient presents with  . Anemia    INTERVAL HISTORY: Patient returns to clinic today for repeat laboratory work and further evaluation. She no longer complains of weakness or fatigue today. She has leg pain secondary to a torn hamstring, but otherwise feels well. She has no neurologic complaints. She denies any recent fevers. She has good appetite and denies weight loss. She has no chest pain or shortness of breath. She denies any nausea, vomiting, constipation, or diarrhea. She has no urinary complaints. Patient otherwise feels well and offers no further specific complaints.  REVIEW OF SYSTEMS:   Review of Systems  Constitutional: Negative for fever, weight loss and malaise/fatigue.  Respiratory: Negative.  Negative for shortness of breath.   Cardiovascular: Negative.  Negative for chest pain.  Gastrointestinal: Negative.  Negative for blood in stool and melena.  Musculoskeletal: Negative.   Neurological: Negative.  Negative for weakness.    As per HPI. Otherwise, a complete review of systems is negatve.  PAST MEDICAL HISTORY: Past Medical History  Diagnosis Date  . Anxiety   . Depression     PAST SURGICAL HISTORY: Past Surgical History  Procedure Laterality Date  . Laparoscopic gastric sleeve resection    . Cholecystectomy N/A 04/26/2015    Procedure: LAPAROSCOPIC CHOLECYSTECTOMY WITH INTRAOPERATIVE CHOLANGIOGRAM;  Surgeon: Molly Maduro, MD;  Location: [facility] ORS;  Service: General;  Laterality: N/A;    FAMILY HISTORY Family History  Problem Relation Age of Onset  . Diabetes Paternal Grandmother   . Diabetes Paternal Grandfather        ADVANCED DIRECTIVES:    HEALTH MAINTENANCE: Social History  Substance  Use Topics  . Smoking status: Never Smoker   . Smokeless tobacco: Never Used  . Alcohol Use: 0.0 oz/week    0 Standard drinks or equivalent per week     Comment: occasionally     Colonoscopy:  PAP:  Bone density:  Lipid panel:  No Known Allergies  Current Outpatient Prescriptions  Medication Sig Dispense Refill  . acetaminophen (TYLENOL) 325 MG tablet Take 650 mg by mouth every 6 (six) hours as needed for moderate pain.    . Calcium Carbonate-Vitamin D 600-200 MG-UNIT TABS Take 1 tablet by mouth daily.    . ferrous sulfate 325 (65 FE) MG tablet Take 325 mg by mouth daily with breakfast.    . ibuprofen (ADVIL,MOTRIN) 200 MG tablet Take 400 mg by mouth every 6 (six) hours as needed for moderate pain.     . Multiple Vitamins-Minerals (MULTIVITAMIN ADULT) TABS Take 1 tablet by mouth daily.    . sertraline (ZOLOFT) 50 MG tablet Take 50 mg by mouth daily.     No current facility-administered medications for this visit.    OBJECTIVE: Filed Vitals:   12/28/15 1441  BP: 102/68  Pulse: 69  Temp: 99.4 F (37.4 C)  Resp: 16     Body mass index is 26.56 kg/(m^2).    ECOG FS:0 - Asymptomatic  General: Well-developed, well-nourished, no acute distress. Eyes: Pink conjunctiva, anicteric sclera. Lungs: Clear to auscultation bilaterally. Heart: Regular rate and rhythm. No rubs, murmurs, or gallops. Abdomen: Soft, nontender, nondistended. No organomegaly noted, normoactive bowel sounds. Musculoskeletal: No edema, cyanosis, or clubbing. Neuro: Alert, answering all questions appropriately. Cranial nerves grossly intact. Skin: No  rashes or petechiae noted. Psych: Normal affect.   LAB RESULTS:  Lab Results  Component Value Date   NA 141 04/26/2015   K 3.8 04/26/2015   CL 106 04/26/2015   CO2 29 04/26/2015   GLUCOSE 94 04/26/2015   BUN 8 04/26/2015   CREATININE 0.67 04/26/2015   CALCIUM 8.4* 04/26/2015   PROT 6.2* 04/26/2015   ALBUMIN 3.4* 04/26/2015   AST 17 04/26/2015   ALT  14 04/26/2015   ALKPHOS 65 04/26/2015   BILITOT 1.4* 04/26/2015   GFRNONAA >60 04/26/2015   GFRAA >60 04/26/2015    Lab Results  Component Value Date   WBC 6.7 12/28/2015   NEUTROABS 3.8 12/28/2015   HGB 13.4 12/28/2015   HCT 38.7 12/28/2015   MCV 90.2 12/28/2015   PLT 293 12/28/2015   Lab Results  Component Value Date   IRON 76 12/28/2015   TIBC 312 12/28/2015   IRONPCTSAT 24 12/28/2015   Lab Results  Component Value Date   FERRITIN 81 12/28/2015      STUDIES: Mr Femur Right Wo Contrast  12/30/2015  CLINICAL DATA:  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 No acute injury or prior relevant surgery. EXAM: MRI OF THE RIGHT FEMUR WITHOUT CONTRAST TECHNIQUE: Multiplanar, multisequence MR imaging of the right femur was performed. No intravenous contrast was administered. COMPARISON:  Pelvic CT 04/25/2015 FINDINGS: Study was performed using the body coil. Both thighs are included on the axial and coronal images. Images extend from the mid pelvis through the distal thighs. The knees are not imaged. Both femoral heads appear normal without evidence of acute fracture, dislocation or femoral head avascular necrosis. The femoral diaphyses appear normal. There is no significant hip joint effusion or periarticular fluid collection. There are no significant degenerative changes at either hip. There is mild asymmetric common hamstring tendinosis on the right. No evidence of hamstring tendon tear or muscular signal abnormality. The hamstring muscles appear symmetric. The quadriceps and adductor musculature appears normal. No vascular abnormalities are seen. The visualized internal contents are notable for small cervical nabothian cysts, myometrial heterogeneity on the left suspicious for adenomyosis and a small amount of free pelvic fluid. IMPRESSION: 1. Mild asymmetric common hamstring tendinosis on the right. No evidence of hamstring tendon tear. 2. No acute  osseous findings or significant arthropathic changes. Electronically Signed   By: Richardean Sale M.D.   On: 12/30/2015 15:53    ASSESSMENT: Iron deficiency anemia.  PLAN:    1. Iron deficiency anemia:  Patient's hemoglobin and iron stores are now within normal limits. Previous, the remainder of her laboratory work was either negative or within normal limits. She last received Feraheme in November 2016. No intervention is needed at this time. She has been instructed to continue her oral iron supplementation.  Return to clinic in 4 months for further evaluation.  Patient expressed understanding and was in agreement with this plan. She also understands that She can call clinic at any time with any questions, concerns, or complaints.    Lloyd Huger, MD   01/02/2016 3:36 PM

## 2016-02-26 ENCOUNTER — Ambulatory Visit: Payer: BLUE CROSS/BLUE SHIELD | Admitting: *Deleted

## 2016-02-26 ENCOUNTER — Ambulatory Visit
Admission: RE | Admit: 2016-02-26 | Discharge: 2016-02-26 | Disposition: A | Discharge status: Home / Self Care | Payer: BLUE CROSS/BLUE SHIELD | Source: Ambulatory Visit | Attending: Gastroenterology | Admitting: Gastroenterology

## 2016-02-26 ENCOUNTER — Encounter
Admission: RE | Disposition: A | Discharge status: Home / Self Care | Payer: Self-pay | Source: Ambulatory Visit | Attending: Gastroenterology

## 2016-02-26 ENCOUNTER — Encounter: Payer: Self-pay | Admitting: *Deleted

## 2016-02-26 DIAGNOSIS — Z79899 Other long term (current) drug therapy: Secondary | ICD-10-CM | POA: Insufficient documentation

## 2016-02-26 DIAGNOSIS — Z8249 Family history of ischemic heart disease and other diseases of the circulatory system: Secondary | ICD-10-CM | POA: Insufficient documentation

## 2016-02-26 DIAGNOSIS — Z8262 Family history of osteoporosis: Secondary | ICD-10-CM | POA: Insufficient documentation

## 2016-02-26 DIAGNOSIS — D120 Benign neoplasm of cecum: Secondary | ICD-10-CM | POA: Insufficient documentation

## 2016-02-26 DIAGNOSIS — F419 Anxiety disorder, unspecified: Secondary | ICD-10-CM | POA: Insufficient documentation

## 2016-02-26 DIAGNOSIS — K5730 Diverticulosis of large intestine without perforation or abscess without bleeding: Secondary | ICD-10-CM | POA: Insufficient documentation

## 2016-02-26 DIAGNOSIS — Z800 Family history of malignant neoplasm of digestive organs: Secondary | ICD-10-CM | POA: Diagnosis not present

## 2016-02-26 DIAGNOSIS — D125 Benign neoplasm of sigmoid colon: Secondary | ICD-10-CM | POA: Diagnosis not present

## 2016-02-26 DIAGNOSIS — Z9049 Acquired absence of other specified parts of digestive tract: Secondary | ICD-10-CM | POA: Diagnosis not present

## 2016-02-26 DIAGNOSIS — Z1211 Encounter for screening for malignant neoplasm of colon: Secondary | ICD-10-CM | POA: Diagnosis not present

## 2016-02-26 HISTORY — PX: COLONOSCOPY WITH PROPOFOL: SHX5780

## 2016-02-26 HISTORY — DX: Anemia, unspecified: D64.9

## 2016-02-26 LAB — POCT PREGNANCY, URINE: Preg Test, Ur: NEGATIVE

## 2016-02-26 SURGERY — COLONOSCOPY WITH PROPOFOL
Anesthesia: General

## 2016-02-26 MED ORDER — EPHEDRINE SULFATE 50 MG/ML IJ SOLN
INTRAMUSCULAR | Status: DC | PRN
  Administered 2016-02-26: 15 mg via INTRAVENOUS

## 2016-02-26 MED ORDER — SODIUM CHLORIDE 0.9 % IV SOLN
INTRAVENOUS | Status: DC
  Administered 2016-02-26 (×2): via INTRAVENOUS

## 2016-02-26 MED ORDER — MIDAZOLAM HCL 2 MG/2ML IJ SOLN
INTRAMUSCULAR | Status: DC | PRN
  Administered 2016-02-26: 1 mg via INTRAVENOUS

## 2016-02-26 MED ORDER — PROPOFOL 10 MG/ML IV BOLUS
INTRAVENOUS | Status: DC | PRN
  Administered 2016-02-26: 35 mg via INTRAVENOUS
  Administered 2016-02-26: 40 mg via INTRAVENOUS
  Administered 2016-02-26: 20 mg via INTRAVENOUS
  Administered 2016-02-26: 35 mg via INTRAVENOUS
  Administered 2016-02-26: 20 mg via INTRAVENOUS

## 2016-02-26 MED ORDER — PROPOFOL 500 MG/50ML IV EMUL
INTRAVENOUS | Status: DC | PRN
  Administered 2016-02-26: 150 ug/kg/min via INTRAVENOUS

## 2016-02-26 MED ORDER — SODIUM CHLORIDE 0.9 % IV SOLN: INTRAVENOUS | Status: DC

## 2016-02-26 NOTE — Transfer of Care (Signed)
Immediate Anesthesia Transfer of Care Note  Patient: Bethany Flores  Procedure(s) Performed: Procedure(s): COLONOSCOPY WITH PROPOFOL (N/A)  Patient Location: PACU  Anesthesia Type:General  Level of Consciousness: awake, alert  and oriented  Airway & Oxygen Therapy: Patient Spontanous Breathing and Patient connected to nasal cannula oxygen  Post-op Assessment: Report given to RN  Post vital signs: Reviewed and stable  Last Vitals:  Filed Vitals:   02/26/16 1239  BP: 112/79  Pulse: 66  Temp: 36.1 C  Resp: 18    Complications: No apparent anesthesia complications

## 2016-02-26 NOTE — Op Note (Signed)
[facility] Patient Name: Bethany Flores Procedure Date: 02/26/2016 1:07 PM MRN: [id number] Account #: [id number] Date of Birth: [birth year] Admit Type: Outpatient Age: 50 Room: [facility] ENDO ROOM 3 Gender: Female Note Status: Finalized Procedure:            Colonoscopy Indications:          Family history of colon cancer in a first-degree                        relative Providers:            Lollie Sails, MD Referring MD:         Youlanda Roys. Lovie Macadamia, MD (Referring MD) Medicines:            Monitored Anesthesia Care Complications:        No immediate complications. Procedure:            Pre-Anesthesia Assessment:                       - ASA Grade Assessment: II - A patient with mild                        systemic disease.                       After obtaining informed consent, the colonoscope was                        passed under direct vision. Throughout the procedure,                        the patient's blood pressure, pulse, and oxygen                        saturations were monitored continuously. The                        Colonoscope was introduced through the anus and                        advanced to the the cecum, identified by appendiceal                        orifice and ileocecal valve. The colonoscopy was                        performed without difficulty. The patient tolerated the                        procedure well. The quality of the bowel preparation                        was good. Findings:      A 1 mm polyp was found in the cecum. The polyp was sessile. The polyp       was removed with a cold biopsy forceps. Resection and retrieval were       complete.      A 1 mm polyp was found in the sigmoid colon. The polyp was sessile. The       polyp was removed with a cold biopsy forceps. Resection and retrieval  were complete.      A few small-mouthed diverticula were found in the sigmoid colon.      The digital rectal  exam was normal. Impression:           - One 1 mm polyp in the cecum, removed with a cold                        biopsy forceps. Resected and retrieved.                       - One 1 mm polyp in the sigmoid colon, removed with a                        cold biopsy forceps. Resected and retrieved.                       - Diverticulosis in the sigmoid colon. Recommendation:       - Discharge patient to home.                       - Telephone GI clinic for pathology results in 1 week. Procedure Code(s):    --- Professional ---                       [phone], Colonoscopy, flexible; with biopsy, single or                        multiple Diagnosis Code(s):    --- Professional ---                       D12.0, Benign neoplasm of cecum                       D12.5, Benign neoplasm of sigmoid colon                       Z80.0, Family history of malignant neoplasm of                        digestive organs                       K57.30, Diverticulosis of large intestine without                        perforation or abscess without bleeding CPT copyright 2016 [employer]. All rights reserved. The codes documented in this report are preliminary and upon coder review may  be revised to meet current compliance requirements. Lollie Sails, MD 02/26/2016 2:01:01 PM This report has been signed electronically. Number of Addenda: 0 Note Initiated On: 02/26/2016 1:07 PM Scope Withdrawal Time: 0 hours 13 minutes 31 seconds  Total Procedure Duration: 0 hours 26 minutes 29 seconds       [facility]

## 2016-02-26 NOTE — Anesthesia Postprocedure Evaluation (Signed)
Anesthesia Post Note  Patient: Bethany Flores  Procedure(s) Performed: Procedure(s) (LRB): COLONOSCOPY WITH PROPOFOL (N/A)  Patient location during evaluation: Endoscopy Anesthesia Type: General Level of consciousness: awake Pain management: pain level controlled Vital Signs Assessment: post-procedure vital signs reviewed and stable Respiratory status: spontaneous breathing Cardiovascular status: blood pressure returned to baseline Postop Assessment: no headache Anesthetic complications: no    Last Vitals:  Filed Vitals:   02/26/16 1239 02/26/16 1402  BP: 112/79 103/55  Pulse: 66 71  Temp: 36.1 C 36.1 C  Resp: 18 19    Last Pain: There were no vitals filed for this visit.               Neylan Koroma M

## 2016-02-26 NOTE — Anesthesia Preprocedure Evaluation (Signed)
Anesthesia Evaluation  Patient identified by MRN, date of birth, ID band Patient awake    Reviewed: Allergy & Precautions, NPO status , Patient's Chart, lab work & pertinent test results  Airway Mallampati: I  TM Distance: >3 FB Neck ROM: Full    Dental  (+) Teeth Intact   Pulmonary    Pulmonary exam normal        Cardiovascular Exercise Tolerance: Good negative cardio ROS Normal cardiovascular exam     Neuro/Psych    GI/Hepatic Lap gastric sleeve.   Endo/Other    Renal/GU      Musculoskeletal   Abdominal Normal abdominal exam  (+)   Peds  Hematology   Anesthesia Other Findings   Reproductive/Obstetrics                             Anesthesia Physical Anesthesia Plan  ASA: II  Anesthesia Plan: General   Post-op Pain Management:    Induction: Intravenous  Airway Management Planned: Nasal Cannula  Additional Equipment:   Intra-op Plan:   Post-operative Plan:   Informed Consent: I have reviewed the patients History and Physical, chart, labs and discussed the procedure including the risks, benefits and alternatives for the proposed anesthesia with the patient or authorized representative who has indicated his/her understanding and acceptance.     Plan Discussed with: CRNA  Anesthesia Plan Comments:         Anesthesia Quick Evaluation

## 2016-02-26 NOTE — H&P (Signed)
Outpatient short stay form Pre-procedure 02/26/2016 1:11 PM Bethany Sails MD  Primary Physician: Dr. Juluis Pitch  Reason for visit:  Colonoscopy  History of present illness:  Patient is a 50 year old female presenting today for a high-risk screening colonoscopy. She has a family history of colon cancer in mother. She tolerated her prep well. She takes no aspirin or blood thinning products.    Current facility-administered medications:  .  0.9 %  sodium chloride infusion, , Intravenous, Continuous, Bethany Sails, MD, Last Rate: 20 mL/hr at 02/26/16 1254 .  0.9 %  sodium chloride infusion, , Intravenous, Continuous, Bethany Sails, MD  Prescriptions prior to admission  Medication Sig Dispense Refill Last Dose  . acetaminophen (TYLENOL) 325 MG tablet Take 650 mg by mouth every 6 (six) hours as needed for moderate pain.   Taking  . Calcium Carbonate-Vitamin D 600-200 MG-UNIT TABS Take 1 tablet by mouth daily.   Taking  . ferrous sulfate 325 (65 FE) MG tablet Take 325 mg by mouth daily with breakfast.   Taking  . ibuprofen (ADVIL,MOTRIN) 200 MG tablet Take 400 mg by mouth every 6 (six) hours as needed for moderate pain.    Taking  . Multiple Vitamins-Minerals (MULTIVITAMIN ADULT) TABS Take 1 tablet by mouth daily.   Taking  . sertraline (ZOLOFT) 50 MG tablet Take 50 mg by mouth daily.   Taking     No Known Allergies   Past Medical History  Diagnosis Date  . Anxiety   . Depression   . Anemia     Review of systems:      Physical Exam    Heart and lungs: Regular rate and rhythm without rub or gallop, lungs are bilaterally clear.    HEENT: Normocephalic atraumatic eyes are anicteric    Other:     Pertinant exam for procedure: Soft nontender nondistended bowel sounds positive normoactive.    Planned proceedures: Colonoscopy and indicated procedures.    Bethany Sails, MD Gastroenterology 02/26/2016  1:11 PM

## 2016-02-27 ENCOUNTER — Encounter: Payer: Self-pay | Admitting: Gastroenterology

## 2016-02-28 LAB — SURGICAL PATHOLOGY

## 2016-04-25 ENCOUNTER — Inpatient Hospital Stay: Payer: BLUE CROSS/BLUE SHIELD

## 2016-04-25 ENCOUNTER — Inpatient Hospital Stay: Payer: BLUE CROSS/BLUE SHIELD | Admitting: Oncology

## 2016-05-15 ENCOUNTER — Inpatient Hospital Stay: Payer: BLUE CROSS/BLUE SHIELD | Attending: Oncology

## 2016-05-15 ENCOUNTER — Inpatient Hospital Stay (HOSPITAL_BASED_OUTPATIENT_CLINIC_OR_DEPARTMENT_OTHER): Payer: BLUE CROSS/BLUE SHIELD | Admitting: Oncology

## 2016-05-15 ENCOUNTER — Inpatient Hospital Stay: Payer: BLUE CROSS/BLUE SHIELD

## 2016-05-15 VITALS — BP 114/73 | HR 67 | Temp 99.1°F | Resp 18 | Wt 169.1 lb

## 2016-05-15 DIAGNOSIS — D509 Iron deficiency anemia, unspecified: Secondary | ICD-10-CM

## 2016-05-15 DIAGNOSIS — Z79899 Other long term (current) drug therapy: Secondary | ICD-10-CM | POA: Insufficient documentation

## 2016-05-15 DIAGNOSIS — F329 Major depressive disorder, single episode, unspecified: Secondary | ICD-10-CM | POA: Insufficient documentation

## 2016-05-15 DIAGNOSIS — F419 Anxiety disorder, unspecified: Secondary | ICD-10-CM | POA: Diagnosis not present

## 2016-05-15 LAB — CBC WITH DIFFERENTIAL/PLATELET
BASOS ABS: 0 10*3/uL (ref 0–0.1)
BASOS PCT: 1 %
EOS PCT: 1 %
Eosinophils Absolute: 0.1 10*3/uL (ref 0–0.7)
HCT: 39 % (ref 35.0–47.0)
Hemoglobin: 13.6 g/dL (ref 12.0–16.0)
LYMPHS PCT: 30 %
Lymphs Abs: 1.9 10*3/uL (ref 1.0–3.6)
MCH: 31.3 pg (ref 26.0–34.0)
MCHC: 34.7 g/dL (ref 32.0–36.0)
MCV: 90.2 fL (ref 80.0–100.0)
MONO ABS: 0.4 10*3/uL (ref 0.2–0.9)
Monocytes Relative: 6 %
Neutro Abs: 3.8 10*3/uL (ref 1.4–6.5)
Neutrophils Relative %: 62 %
Platelets: 306 10*3/uL (ref 150–440)
RBC: 4.33 MIL/uL (ref 3.80–5.20)
RDW: 12 % (ref 11.5–14.5)
WBC: 6.1 10*3/uL (ref 3.6–11.0)

## 2016-05-15 LAB — IRON AND TIBC
IRON: 78 ug/dL (ref 28–170)
SATURATION RATIOS: 22 % (ref 10.4–31.8)
TIBC: 359 ug/dL (ref 250–450)
UIBC: 281 ug/dL

## 2016-05-15 LAB — FERRITIN: FERRITIN: 26 ng/mL (ref 11–307)

## 2016-05-15 NOTE — Progress Notes (Signed)
Offers no complaints. States is feeling well. 

## 2016-05-19 NOTE — Progress Notes (Signed)
[place]  [phone] [phone]) [phone]  ID: [name] OB: 02/02/1966  MR#: [id number]  [id number]  Patient Care Team: Bing Quarry, MD as PCP - General (Family Medicine)  CHIEF COMPLAINT:  Chief Complaint  Patient presents with  . Anemia    INTERVAL HISTORY: Patient returns to clinic today for repeat laboratory work, further evaluation, and consideration of additional IV Feraheme. She currently feels well and is asymptomatic. She does not complain of weakness or fatigue today. She has no neurologic complaints. She denies any recent fevers. She has good appetite and denies weight loss. She has no chest pain or shortness of breath. She denies any nausea, vomiting, constipation, or diarrhea. She has no urinary complaints. Patient offers no specific complaints today.  REVIEW OF SYSTEMS:   Review of Systems  Constitutional: Negative.  Negative for fever, weight loss and malaise/fatigue.  Respiratory: Negative.  Negative for shortness of breath.   Cardiovascular: Negative.  Negative for chest pain.  Gastrointestinal: Negative.  Negative for blood in stool and melena.  Musculoskeletal: Negative.   Neurological: Negative.  Negative for weakness.  Psychiatric/Behavioral: Negative.     As per HPI. Otherwise, a complete review of systems is negatve.  PAST MEDICAL HISTORY: Past Medical History  Diagnosis Date  . Anxiety   . Depression   . Anemia     PAST SURGICAL HISTORY: Past Surgical History  Procedure Laterality Date  . Laparoscopic gastric sleeve resection    . Cholecystectomy N/A 04/26/2015    Procedure: LAPAROSCOPIC CHOLECYSTECTOMY WITH INTRAOPERATIVE CHOLANGIOGRAM;  Surgeon: Molly Maduro, MD;  Location: [facility] ORS;  Service: General;  Laterality: N/A;  . Colonoscopy with propofol N/A 02/26/2016    Procedure: COLONOSCOPY WITH PROPOFOL;  Surgeon: Lollie Sails, MD;  Location: [facility] ENDOSCOPY;  Service: Endoscopy;  Laterality:  N/A;    FAMILY HISTORY Family History  Problem Relation Age of Onset  . Diabetes Paternal Grandmother   . Diabetes Paternal Grandfather        ADVANCED DIRECTIVES:    HEALTH MAINTENANCE: Social History  Substance Use Topics  . Smoking status: Never Smoker   . Smokeless tobacco: Never Used  . Alcohol Use: 0.0 oz/week    0 Standard drinks or equivalent per week     Comment: occasionally     Colonoscopy:  PAP:  Bone density:  Lipid panel:  No Known Allergies  Current Outpatient Prescriptions  Medication Sig Dispense Refill  . acetaminophen (TYLENOL) 325 MG tablet Take 650 mg by mouth every 6 (six) hours as needed for moderate pain.    . Calcium Carbonate-Vitamin D 600-200 MG-UNIT TABS Take 1 tablet by mouth daily.    . ferrous sulfate 325 (65 FE) MG tablet Take 325 mg by mouth daily with breakfast.    . ibuprofen (ADVIL,MOTRIN) 200 MG tablet Take 400 mg by mouth every 6 (six) hours as needed for moderate pain.     . Multiple Vitamins-Minerals (MULTIVITAMIN ADULT) TABS Take 1 tablet by mouth daily.    . sertraline (ZOLOFT) 50 MG tablet Take 50 mg by mouth daily.     No current facility-administered medications for this visit.    OBJECTIVE: Filed Vitals:   05/15/16 1425  BP: 114/73  Pulse: 67  Temp: 99.1 F (37.3 C)  Resp: 18     Body mass index is 27.31 kg/(m^2).    ECOG FS:0 - Asymptomatic  General: Well-developed, well-nourished, no acute distress. Eyes: Pink conjunctiva, anicteric sclera. Lungs: Clear to auscultation bilaterally. Heart: Regular rate  and rhythm. No rubs, murmurs, or gallops. Abdomen: Soft, nontender, nondistended. No organomegaly noted, normoactive bowel sounds. Musculoskeletal: No edema, cyanosis, or clubbing. Neuro: Alert, answering all questions appropriately. Cranial nerves grossly intact. Skin: No rashes or petechiae noted. Psych: Normal affect.   LAB RESULTS:  Lab Results  Component Value Date   NA 141 04/26/2015   K 3.8  04/26/2015   CL 106 04/26/2015   CO2 29 04/26/2015   GLUCOSE 94 04/26/2015   BUN 8 04/26/2015   CREATININE 0.67 04/26/2015   CALCIUM 8.4* 04/26/2015   PROT 6.2* 04/26/2015   ALBUMIN 3.4* 04/26/2015   AST 17 04/26/2015   ALT 14 04/26/2015   ALKPHOS 65 04/26/2015   BILITOT 1.4* 04/26/2015   GFRNONAA >60 04/26/2015   GFRAA >60 04/26/2015    Lab Results  Component Value Date   WBC 6.1 05/15/2016   NEUTROABS 3.8 05/15/2016   HGB 13.6 05/15/2016   HCT 39.0 05/15/2016   MCV 90.2 05/15/2016   PLT 306 05/15/2016   Lab Results  Component Value Date   IRON 78 05/15/2016   TIBC 359 05/15/2016   IRONPCTSAT 22 05/15/2016   Lab Results  Component Value Date   FERRITIN 26 05/15/2016      STUDIES: No results found.  ASSESSMENT: Iron deficiency anemia.  PLAN:    1. Iron deficiency anemia:  Patient's hemoglobin and iron stores Continue to be within normal limits. Previously, the remainder of her laboratory work was either negative or within normal limits. She last received Feraheme in November 2016. No intervention is needed at this time. She has been instructed to continue her oral iron supplementation.  Return to clinic in 4 months for further evaluation. If her iron stores continue to be within normal limits at that time, she could possibly be discharged from clinic.  Patient expressed understanding and was in agreement with this plan. She also understands that She can call clinic at any time with any questions, concerns, or complaints.    Lloyd Huger, MD   05/19/2016 11:23 PM

## 2016-07-29 ENCOUNTER — Ambulatory Visit
Admission: RE | Admit: 2016-07-29 | Discharge: 2016-07-29 | Disposition: A | Discharge status: Home / Self Care | Payer: BLUE CROSS/BLUE SHIELD | Source: Ambulatory Visit | Attending: Chiropractic Medicine | Admitting: Chiropractic Medicine

## 2016-07-29 ENCOUNTER — Other Ambulatory Visit: Payer: Self-pay | Admitting: Chiropractic Medicine

## 2016-07-29 DIAGNOSIS — M545 Low back pain: Secondary | ICD-10-CM

## 2016-09-11 NOTE — Progress Notes (Signed)
[place]  [phone] [phone]) [phone]  ID: Bethany Flores OB: [birth year]  MR#: [id number]  [id number]  Patient Care Team: Bing Quarry, MD as PCP - General (Family Medicine)  CHIEF COMPLAINT: Iron deficiency anemia.  INTERVAL HISTORY: Patient returns to clinic today for repeat laboratory work, further evaluation, and consideration of additional IV Feraheme. She currently feels well and is asymptomatic. She does not complain of weakness or fatigue today. She has no neurologic complaints. She denies any recent fevers. She has good appetite and denies weight loss. She has no chest pain or shortness of breath. She denies any nausea, vomiting, constipation, or diarrhea. She has no urinary complaints. Patient offers no specific complaints today.  REVIEW OF SYSTEMS:   Review of Systems  Constitutional: Negative.  Negative for fever, malaise/fatigue and weight loss.  Respiratory: Negative.  Negative for shortness of breath.   Cardiovascular: Negative.  Negative for chest pain.  Gastrointestinal: Negative.  Negative for blood in stool and melena.  Musculoskeletal: Negative.   Neurological: Negative.  Negative for weakness.  Psychiatric/Behavioral: Negative.     As per HPI. Otherwise, a complete review of systems is negative.  PAST MEDICAL HISTORY: Past Medical History:  Diagnosis Date  . Anemia   . Anxiety   . Depression     PAST SURGICAL HISTORY: Past Surgical History:  Procedure Laterality Date  . CHOLECYSTECTOMY N/A 04/26/2015   Procedure: LAPAROSCOPIC CHOLECYSTECTOMY WITH INTRAOPERATIVE CHOLANGIOGRAM;  Surgeon: Molly Maduro, MD;  Location: [facility] ORS;  Service: General;  Laterality: N/A;  . COLONOSCOPY WITH PROPOFOL N/A 02/26/2016   Procedure: COLONOSCOPY WITH PROPOFOL;  Surgeon: Lollie Sails, MD;  Location: [facility] ENDOSCOPY;  Service: Endoscopy;  Laterality: N/A;  . LAPAROSCOPIC GASTRIC SLEEVE RESECTION      FAMILY  HISTORY Family History  Problem Relation Age of Onset  . Diabetes Paternal Grandmother   . Diabetes Paternal Grandfather        ADVANCED DIRECTIVES:    HEALTH MAINTENANCE: Social History  Substance Use Topics  . Smoking status: Never Smoker  . Smokeless tobacco: Never Used  . Alcohol use 0.0 oz/week     Comment: occasionally     Colonoscopy:  PAP:  Bone density:  Lipid panel:  No Known Allergies  Current Outpatient Prescriptions  Medication Sig Dispense Refill  . acetaminophen (TYLENOL) 325 MG tablet Take 650 mg by mouth every 6 (six) hours as needed for moderate pain.    . Calcium Carbonate-Vitamin D 600-200 MG-UNIT TABS Take 1 tablet by mouth daily.    . ferrous sulfate 325 (65 FE) MG tablet Take 325 mg by mouth daily with breakfast.    . ibuprofen (ADVIL,MOTRIN) 200 MG tablet Take 400 mg by mouth every 6 (six) hours as needed for moderate pain.     . Multiple Vitamins-Minerals (MULTIVITAMIN ADULT) TABS Take 1 tablet by mouth daily.    . sertraline (ZOLOFT) 50 MG tablet Take 50 mg by mouth daily.     No current facility-administered medications for this visit.     OBJECTIVE: Vitals:   09/12/16 1353  BP: 116/67  Pulse: 72  Resp: 18  Temp: 99.4 F (37.4 C)     Body mass index is 27.93 kg/m.    ECOG FS:0 - Asymptomatic  General: Well-developed, well-nourished, no acute distress. Eyes: Pink conjunctiva, anicteric sclera. Lungs: Clear to auscultation bilaterally. Heart: Regular rate and rhythm. No rubs, murmurs, or gallops. Abdomen: Soft, nontender, nondistended. No organomegaly noted, normoactive bowel sounds. Musculoskeletal: No edema, cyanosis, or  clubbing. Neuro: Alert, answering all questions appropriately. Cranial nerves grossly intact. Skin: No rashes or petechiae noted. Psych: Normal affect.   LAB RESULTS:  Lab Results  Component Value Date   NA 141 04/26/2015   K 3.8 04/26/2015   CL 106 04/26/2015   CO2 29 04/26/2015   GLUCOSE 94 04/26/2015    BUN 8 04/26/2015   CREATININE 0.67 04/26/2015   CALCIUM 8.4 (L) 04/26/2015   PROT 6.2 (L) 04/26/2015   ALBUMIN 3.4 (L) 04/26/2015   AST 17 04/26/2015   ALT 14 04/26/2015   ALKPHOS 65 04/26/2015   BILITOT 1.4 (H) 04/26/2015   GFRNONAA >60 04/26/2015   GFRAA >60 04/26/2015    Lab Results  Component Value Date   WBC 6.5 09/12/2016   NEUTROABS 3.8 09/12/2016   HGB 12.9 09/12/2016   HCT 37.3 09/12/2016   MCV 88.7 09/12/2016   PLT 322 09/12/2016   Lab Results  Component Value Date   IRON 66 09/12/2016   TIBC 393 09/12/2016   IRONPCTSAT 17 09/12/2016   Lab Results  Component Value Date   FERRITIN 8 (L) 09/12/2016      STUDIES: No results found.  ASSESSMENT: Iron deficiency anemia.  PLAN:    1. Iron deficiency anemia:  Patient's hemoglobin and iron stores continue to be within normal limits, although her ferritin is mildly decreased. Previously, the remainder of her laboratory work was either negative or within normal limits. She last received Feraheme in November 2016. No intervention is needed at this time. She has been instructed to continue her oral iron supplementation.  No further follow-up has been scheduled. If patient's hemoglobin begins to decline or she becomes symptomatic please refer her back for further evaluation.  Patient expressed understanding and was in agreement with this plan. She also understands that She can call clinic at any time with any questions, concerns, or complaints.    Lloyd Huger, MD   09/16/2016 12:27 PM

## 2016-09-12 ENCOUNTER — Inpatient Hospital Stay: Payer: BLUE CROSS/BLUE SHIELD

## 2016-09-12 ENCOUNTER — Inpatient Hospital Stay: Payer: BLUE CROSS/BLUE SHIELD | Attending: Oncology

## 2016-09-12 ENCOUNTER — Inpatient Hospital Stay (HOSPITAL_BASED_OUTPATIENT_CLINIC_OR_DEPARTMENT_OTHER): Payer: BLUE CROSS/BLUE SHIELD | Admitting: Oncology

## 2016-09-12 VITALS — BP 116/67 | HR 72 | Temp 99.4°F | Resp 18 | Wt 173.1 lb

## 2016-09-12 DIAGNOSIS — D509 Iron deficiency anemia, unspecified: Secondary | ICD-10-CM | POA: Diagnosis not present

## 2016-09-12 DIAGNOSIS — Z79899 Other long term (current) drug therapy: Secondary | ICD-10-CM

## 2016-09-12 DIAGNOSIS — F419 Anxiety disorder, unspecified: Secondary | ICD-10-CM | POA: Insufficient documentation

## 2016-09-12 DIAGNOSIS — F329 Major depressive disorder, single episode, unspecified: Secondary | ICD-10-CM

## 2016-09-12 LAB — CBC WITH DIFFERENTIAL/PLATELET
BASOS ABS: 0 10*3/uL (ref 0–0.1)
Basophils Relative: 1 %
EOS ABS: 0.1 10*3/uL (ref 0–0.7)
EOS PCT: 1 %
HCT: 37.3 % (ref 35.0–47.0)
Hemoglobin: 12.9 g/dL (ref 12.0–16.0)
LYMPHS ABS: 2.3 10*3/uL (ref 1.0–3.6)
LYMPHS PCT: 36 %
MCH: 30.7 pg (ref 26.0–34.0)
MCHC: 34.6 g/dL (ref 32.0–36.0)
MCV: 88.7 fL (ref 80.0–100.0)
Monocytes Absolute: 0.3 10*3/uL (ref 0.2–0.9)
Monocytes Relative: 5 %
NEUTROS PCT: 57 %
Neutro Abs: 3.8 10*3/uL (ref 1.4–6.5)
PLATELETS: 322 10*3/uL (ref 150–440)
RBC: 4.2 MIL/uL (ref 3.80–5.20)
RDW: 12.5 % (ref 11.5–14.5)
WBC: 6.5 10*3/uL (ref 3.6–11.0)

## 2016-09-12 LAB — FERRITIN: Ferritin: 8 ng/mL — ABNORMAL LOW (ref 11–307)

## 2016-09-12 LAB — IRON AND TIBC
IRON: 66 ug/dL (ref 28–170)
Saturation Ratios: 17 % (ref 10.4–31.8)
TIBC: 393 ug/dL (ref 250–450)
UIBC: 327 ug/dL

## 2016-09-12 NOTE — Progress Notes (Signed)
States is feeling well today. Offers no complaints. 

## 2016-10-17 ENCOUNTER — Other Ambulatory Visit: Payer: Self-pay | Admitting: Family Medicine

## 2016-10-17 DIAGNOSIS — Z1231 Encounter for screening mammogram for malignant neoplasm of breast: Secondary | ICD-10-CM

## 2016-11-13 ENCOUNTER — Emergency Department: Payer: BLUE CROSS/BLUE SHIELD

## 2016-11-13 ENCOUNTER — Inpatient Hospital Stay
Admission: EM | Admit: 2016-11-13 | Discharge: 2016-11-16 | DRG: 446 | Disposition: A | Discharge status: Home / Self Care | Payer: BLUE CROSS/BLUE SHIELD | Attending: Internal Medicine | Admitting: Internal Medicine

## 2016-11-13 ENCOUNTER — Encounter: Payer: Self-pay | Admitting: Emergency Medicine

## 2016-11-13 DIAGNOSIS — R1010 Upper abdominal pain, unspecified: Secondary | ICD-10-CM

## 2016-11-13 DIAGNOSIS — F419 Anxiety disorder, unspecified: Secondary | ICD-10-CM | POA: Diagnosis present

## 2016-11-13 DIAGNOSIS — R1011 Right upper quadrant pain: Secondary | ICD-10-CM

## 2016-11-13 DIAGNOSIS — Z79899 Other long term (current) drug therapy: Secondary | ICD-10-CM | POA: Diagnosis not present

## 2016-11-13 DIAGNOSIS — F329 Major depressive disorder, single episode, unspecified: Secondary | ICD-10-CM | POA: Diagnosis present

## 2016-11-13 DIAGNOSIS — K8051 Calculus of bile duct without cholangitis or cholecystitis with obstruction: Principal | ICD-10-CM | POA: Diagnosis present

## 2016-11-13 DIAGNOSIS — K839 Disease of biliary tract, unspecified: Secondary | ICD-10-CM | POA: Diagnosis not present

## 2016-11-13 DIAGNOSIS — R748 Abnormal levels of other serum enzymes: Secondary | ICD-10-CM | POA: Diagnosis present

## 2016-11-13 DIAGNOSIS — Z9049 Acquired absence of other specified parts of digestive tract: Secondary | ICD-10-CM

## 2016-11-13 DIAGNOSIS — K8050 Calculus of bile duct without cholangitis or cholecystitis without obstruction: Secondary | ICD-10-CM | POA: Diagnosis not present

## 2016-11-13 DIAGNOSIS — D649 Anemia, unspecified: Secondary | ICD-10-CM | POA: Diagnosis present

## 2016-11-13 DIAGNOSIS — R17 Unspecified jaundice: Secondary | ICD-10-CM

## 2016-11-13 LAB — COMPREHENSIVE METABOLIC PANEL
ALT: 247 U/L — AB (ref 14–54)
AST: 112 U/L — ABNORMAL HIGH (ref 15–41)
Albumin: 3.9 g/dL (ref 3.5–5.0)
Alkaline Phosphatase: 341 U/L — ABNORMAL HIGH (ref 38–126)
Anion gap: 10 (ref 5–15)
BILIRUBIN TOTAL: 5.2 mg/dL — AB (ref 0.3–1.2)
BUN: 10 mg/dL (ref 6–20)
CHLORIDE: 105 mmol/L (ref 101–111)
CO2: 22 mmol/L (ref 22–32)
CREATININE: 0.73 mg/dL (ref 0.44–1.00)
Calcium: 9 mg/dL (ref 8.9–10.3)
Glucose, Bld: 88 mg/dL (ref 65–99)
POTASSIUM: 3.8 mmol/L (ref 3.5–5.1)
Sodium: 137 mmol/L (ref 135–145)
TOTAL PROTEIN: 7.9 g/dL (ref 6.5–8.1)

## 2016-11-13 LAB — URINALYSIS, COMPLETE (UACMP) WITH MICROSCOPIC
GLUCOSE, UA: NEGATIVE mg/dL
HGB URINE DIPSTICK: NEGATIVE
KETONES UR: 20 mg/dL — AB
LEUKOCYTES UA: NEGATIVE
NITRITE: NEGATIVE
PH: 6 (ref 5.0–8.0)
Protein, ur: NEGATIVE mg/dL
SPECIFIC GRAVITY, URINE: 1.017 (ref 1.005–1.030)

## 2016-11-13 LAB — CBC
HCT: 37.2 % (ref 35.0–47.0)
Hemoglobin: 12.7 g/dL (ref 12.0–16.0)
MCH: 29 pg (ref 26.0–34.0)
MCHC: 34.2 g/dL (ref 32.0–36.0)
MCV: 84.9 fL (ref 80.0–100.0)
PLATELETS: 280 10*3/uL (ref 150–440)
RBC: 4.38 MIL/uL (ref 3.80–5.20)
RDW: 13.4 % (ref 11.5–14.5)
WBC: 6.9 10*3/uL (ref 3.6–11.0)

## 2016-11-13 LAB — BILIRUBIN, DIRECT: Bilirubin, Direct: 2.8 mg/dL — ABNORMAL HIGH (ref 0.1–0.5)

## 2016-11-13 LAB — LIPASE, BLOOD: LIPASE: 27 U/L (ref 11–51)

## 2016-11-13 LAB — PREGNANCY, URINE: Preg Test, Ur: NEGATIVE

## 2016-11-13 MED ORDER — HYDROMORPHONE HCL 1 MG/ML IJ SOLN
1.0000 mg | Freq: Once | INTRAMUSCULAR | Status: AC
  Administered 2016-11-13: 1 mg via INTRAVENOUS
  Filled 2016-11-13: qty 1

## 2016-11-13 MED ORDER — ACETAMINOPHEN 325 MG PO TABS
650.0000 mg | ORAL_TABLET | Freq: Four times a day (QID) | ORAL | Status: DC | PRN
Start: 2016-11-13 — End: 2016-11-16

## 2016-11-13 MED ORDER — ONDANSETRON HCL 4 MG/2ML IJ SOLN
4.0000 mg | Freq: Once | INTRAMUSCULAR | Status: AC
  Administered 2016-11-13: 4 mg via INTRAVENOUS
  Filled 2016-11-13: qty 2

## 2016-11-13 MED ORDER — SODIUM CHLORIDE 0.9 % IV SOLN
Freq: Once | INTRAVENOUS | Status: AC
Start: 2016-11-13 — End: 2016-11-13
  Administered 2016-11-13: 16:00:00 via INTRAVENOUS

## 2016-11-13 MED ORDER — ACETAMINOPHEN 650 MG RE SUPP: 650.0000 mg | Freq: Four times a day (QID) | RECTAL | Status: DC | PRN

## 2016-11-13 MED ORDER — IOPAMIDOL (ISOVUE-300) INJECTION 61%
30.0000 mL | Freq: Once | INTRAVENOUS | Status: AC
  Administered 2016-11-13: 30 mL via ORAL
  Filled 2016-11-13: qty 30

## 2016-11-13 MED ORDER — MORPHINE SULFATE (PF) 2 MG/ML IV SOLN: 2.0000 mg | INTRAVENOUS | Status: DC | PRN

## 2016-11-13 MED ORDER — POLYETHYLENE GLYCOL 3350 17 G PO PACK: 17.0000 g | PACK | Freq: Every day | ORAL | Status: DC | PRN

## 2016-11-13 MED ORDER — ENOXAPARIN SODIUM 40 MG/0.4ML ~~LOC~~ SOLN
40.0000 mg | SUBCUTANEOUS | Status: DC
  Administered 2016-11-13 – 2016-11-15 (×3): 40 mg via SUBCUTANEOUS
  Filled 2016-11-13 (×3): qty 0.4

## 2016-11-13 MED ORDER — IOPAMIDOL (ISOVUE-300) INJECTION 61%
100.0000 mL | Freq: Once | INTRAVENOUS | Status: AC | PRN
  Administered 2016-11-13: 100 mL via INTRAVENOUS
  Filled 2016-11-13: qty 100

## 2016-11-13 MED ORDER — HYDROCODONE-ACETAMINOPHEN 5-325 MG PO TABS
1.0000 | ORAL_TABLET | ORAL | Status: DC | PRN
  Administered 2016-11-13: 1 via ORAL
  Administered 2016-11-14 (×2): 2 via ORAL
  Filled 2016-11-13 (×2): qty 2
  Filled 2016-11-13: qty 1

## 2016-11-13 NOTE — ED Provider Notes (Signed)
Patient seen and evaluated by me. Likely Choledocholithiasis Medical screening examination/treatment/procedure(s) were performed by non-physician practitioner and as supervising physician I was immediately available for consultation/collaboration.     Earleen Newport, MD 11/13/16 [phone]

## 2016-11-13 NOTE — ED Notes (Signed)
Up ambulatory to restroom , no distress noted

## 2016-11-13 NOTE — ED Notes (Signed)
Admitting Provider at bedside. 

## 2016-11-13 NOTE — ED Provider Notes (Signed)
[facility] Emergency Department Provider Note  ____________________________________________  Time seen: Approximately 2:26 PM  I have reviewed the triage vital signs and the nursing notes.   HISTORY  Chief Complaint Abdominal Pain    HPI Bethany Flores is a 50 y.o. female that presents to the emergency department with 4 days of epigastric pain. Pain feels sharp and was originally coming and going but is now constant. Eating makes the pain worse. Patient has been taking Tums for symptoms but it has not been helping. No fever, chills, shortness of breath, chest pain, nausea, vomiting, diarrhea, constipation, urinary symptoms. Patient has history of cholecystectomy and gastric sleeve.   Past Medical History:  Diagnosis Date  . Anemia   . Anxiety   . Depression     Patient Active Problem List   Diagnosis Date Noted  . Iron deficiency anemia 09/18/2015  . Right hamstring muscle strain 08/18/2015  . Acute cholecystitis 04/25/2015  . IT band syndrome 01/25/2015  . Gastrocnemius muscle tear 05/12/2012  . Calf pain 05/11/2012    Past Surgical History:  Procedure Laterality Date  . CHOLECYSTECTOMY N/A 04/26/2015   Procedure: LAPAROSCOPIC CHOLECYSTECTOMY WITH INTRAOPERATIVE CHOLANGIOGRAM;  Surgeon: Molly Maduro, MD;  Location: [facility] ORS;  Service: General;  Laterality: N/A;  . COLONOSCOPY WITH PROPOFOL N/A 02/26/2016   Procedure: COLONOSCOPY WITH PROPOFOL;  Surgeon: Lollie Sails, MD;  Location: [facility] ENDOSCOPY;  Service: Endoscopy;  Laterality: N/A;  . LAPAROSCOPIC GASTRIC SLEEVE RESECTION      Prior to Admission medications   Medication Sig Start Date End Date Taking? Authorizing Provider  acetaminophen (TYLENOL) 325 MG tablet Take 650 mg by mouth every 6 (six) hours as needed for moderate pain.   Yes Historical Provider, MD  Calcium Carbonate-Vitamin D 600-200 MG-UNIT TABS Take 1 tablet by mouth daily.   Yes Historical Provider, MD  ibuprofen  (ADVIL,MOTRIN) 200 MG tablet Take 400 mg by mouth every 6 (six) hours as needed for moderate pain.    Yes Historical Provider, MD  Multiple Vitamins-Minerals (MULTIVITAMIN ADULT) TABS Take 1 tablet by mouth daily.   Yes Historical Provider, MD  sertraline (ZOLOFT) 50 MG tablet Take 50 mg by mouth daily.   Yes Historical Provider, MD    Allergies Patient has no known allergies.  Family History  Problem Relation Age of Onset  . Diabetes Paternal Grandmother   . Diabetes Paternal Grandfather     Social History Social History  Substance Use Topics  . Smoking status: Never Smoker  . Smokeless tobacco: Never Used  . Alcohol use 0.0 oz/week     Comment: occasionally     Review of Systems  Constitutional: No fever/chills ENT: No upper respiratory complaints. Cardiovascular: No chest pain. Respiratory: No cough. No SOB. Gastrointestinal:  No nausea, no vomiting.  Genitourinary: Negative for dysuria. Musculoskeletal: Negative for musculoskeletal pain. Skin: Negative for rash, abrasions, lacerations, ecchymosis.    ____________________________________________   PHYSICAL EXAM:  VITAL SIGNS: ED Triage Vitals  Enc Vitals Group     BP 11/13/16 1336 127/71     Pulse Rate 11/13/16 1336 70     Resp 11/13/16 1336 18     Temp 11/13/16 1336 98 F (36.7 C)     Temp Source 11/13/16 1336 Oral     SpO2 11/13/16 1336 100 %     Weight 11/13/16 1302 173 lb (78.5 kg)     Height 11/13/16 1336 5\' 6"  (1.676 m)     Head Circumference --  Peak Flow --      Pain Score 11/13/16 1302 8     Pain Loc --      Pain Edu? --      Excl. in [place]? --      Constitutional: Alert and oriented. Well appearing and in no acute distress. Eyes: Conjunctivae are normal. PERRL. EOMI. Head: Atraumatic. ENT:      Ears:      Nose: No congestion/rhinnorhea.      Mouth/Throat: Mucous membranes are moist.  Neck: No stridor.  Cardiovascular: Normal rate, regular rhythm. Normal S1 and S2.  Good peripheral  circulation. Respiratory: Normal respiratory effort without tachypnea or retractions. Lungs CTAB. Good air entry to the bases with no decreased or absent breath sounds. Gastrointestinal: Bowel sounds 4 quadrants. Epigastric tenderness. No guarding or rigidity. No palpable masses. No distention.  Musculoskeletal: Full range of motion to all extremities. No gross deformities appreciated. Neurologic:  Normal speech and language. No gross focal neurologic deficits are appreciated.  Skin:  Skin is warm, dry and intact. No rash noted. Psychiatric: Mood and affect are normal. Speech and behavior are normal. Patient exhibits appropriate insight and judgement.   ____________________________________________   LABS (all labs ordered are listed, but only abnormal results are displayed)  Labs Reviewed  COMPREHENSIVE METABOLIC PANEL - Abnormal; Notable for the following:       Result Value   AST 112 (*)    ALT 247 (*)    Alkaline Phosphatase 341 (*)    Total Bilirubin 5.2 (*)    All other components within normal limits  URINALYSIS, COMPLETE (UACMP) WITH MICROSCOPIC - Abnormal; Notable for the following:    Color, Urine AMBER (*)    APPearance CLEAR (*)    Bilirubin Urine SMALL (*)    Ketones, ur 20 (*)    Bacteria, UA RARE (*)    Squamous Epithelial / LPF 0-5 (*)    All other components within normal limits  LIPASE, BLOOD  CBC  PREGNANCY, URINE   ____________________________________________  EKG   ____________________________________________  RADIOLOGY   Ct Abdomen Pelvis W Contrast  Result Date: 11/13/2016 CLINICAL DATA:  Abdominal pain for 3 days. Nausea. Increasing pain today. History of gastric sleeve resection and cholecystectomy. EXAM: CT ABDOMEN AND PELVIS WITH CONTRAST TECHNIQUE: Multidetector CT imaging of the abdomen and pelvis was performed using the standard protocol following bolus administration of intravenous contrast. CONTRAST:  130mL ISOVUE-300 IOPAMIDOL  (ISOVUE-300) INJECTION 61% COMPARISON:  Ultrasound today. CT 04/25/2015. Right thigh MRI 12/30/2015. FINDINGS: Lower chest: Clear lung bases. No significant pleural or pericardial effusion. Hepatobiliary: Interval cholecystectomy compared with previous CT. There is new intra and extrahepatic biliary dilatation. The combined bile duct measures up to 9 mm in diameter. No calcified intraductal stones identified. No focal hepatic lesions. Pancreas: Unremarkable. No pancreatic ductal dilatation or surrounding inflammatory changes. Spleen: Normal in size without focal abnormality. Adrenals/Urinary Tract: Both adrenal glands appear normal. The kidneys appear normal without evidence of urinary tract calculus, suspicious lesion or hydronephrosis. No bladder abnormalities are seen. Stomach/Bowel: No evidence of bowel wall thickening, distention or surrounding inflammatory change. Postsurgical changes related to previous gastric sleeve resection. The appendix appears normal. Vascular/Lymphatic: There are no enlarged abdominal or pelvic lymph nodes. No significant vascular findings are present. Reproductive: Progressive uterine enlargement and heterogeneity. There is a 7.0 x 6.3 cm mass on the left which is likely a large fibroid or focal adenomyosis. Urine pregnancy test is negative. No adnexal mass. A small amount of free pelvic  fluid is present. Other: No evidence of abdominal wall mass. Musculoskeletal: No acute or significant osseous findings. Mild lower lumbar spondylosis. IMPRESSION: 1. Interval cholecystectomy with new intra and extrahepatic biliary prominence. Given the patient's liver function studies, these findings are suspicious for possible choledocholithiasis. Consider MRCP for further evaluation. 2. No evidence of bowel obstruction or intra- abdominal inflammation. The appendix appears normal. 3. Enlarging left uterine mass, likely fibroid or focal adenomyosis. Electronically Signed   By: Richardean Sale M.D.    On: 11/13/2016 17:58   US Abdomen Limited Ruq  Result Date: 11/13/2016 CLINICAL DATA:  Right upper quadrant pain, epigastric pain since Sunday, worsening today. EXAM: US ABDOMEN LIMITED - RIGHT UPPER QUADRANT COMPARISON:  CT 04/25/2015 FINDINGS: Gallbladder: Prior cholecystectomy Common bile duct: Diameter: Dilated, 10 mm. No visible stones. The distal duct is not visualized due to overlying bowel gas. Liver: No focal lesion identified. Within normal limits in parenchymal echogenicity. Mild intrahepatic biliary ductal dilatation. IMPRESSION: Prior cholecystectomy. Mildly dilated common bile duct and intrahepatic biliary ducts. This may be related to post cholecystectomy state. The distal duct cannot be visualized due to overlying bowel gas. Recommend correlation with LFTs. If further evaluation is felt warranted, MRCP may be beneficial. Electronically Signed   By: Rolm Baptise M.D.   On: 11/13/2016 16:51    ____________________________________________    PROCEDURES  Procedure(s) performed:    Procedures    Medications  iopamidol (ISOVUE-300) 61 % injection 30 mL (30 mLs Oral Contrast Given 11/13/16 1454)  HYDROmorphone (DILAUDID) injection 1 mg (1 mg Intravenous Given 11/13/16 1532)  ondansetron (ZOFRAN) injection 4 mg (4 mg Intravenous Given 11/13/16 1530)  0.9 %  sodium chloride infusion ( Intravenous New Bag/Given 11/13/16 1530)  iopamidol (ISOVUE-300) 61 % injection 100 mL (100 mLs Intravenous Contrast Given 11/13/16 1735)     ____________________________________________   INITIAL IMPRESSION / ASSESSMENT AND PLAN / ED COURSE  Pertinent labs & imaging results that were available during my care of the patient were reviewed by me and considered in my medical decision making (see chart for details).  Review of the [place] CSRS was performed in accordance of the [place] prior to dispensing any controlled drugs.  Clinical Course     Patient's diagnosis is consistent with  Choledocholithiasis. Elevated ALT, AST, alkaline phosphatase, bilirubin on CMP. Ultrasound and CT indicate possible choledocholithiasis. After discussion with Dr. Jimmye Norman and hospitalist, patient will be admitted to the hospital for ERCP.   ____________________________________________  FINAL CLINICAL IMPRESSION(S) / ED DIAGNOSES  Final diagnoses:  Pain of upper abdomen  Choledocholithiasis      NEW MEDICATIONS STARTED DURING THIS VISIT:  New Prescriptions   No medications on file        This chart was dictated using voice recognition software/Dragon. Despite best efforts to proofread, errors can occur which can change the meaning. Any change was purely unintentional.    Laban Emperor, PA-C 11/13/16 1941

## 2016-11-13 NOTE — H&P (Signed)
[place] [facility] [place] NAME: Bethany Flores    MR#:  [id number]  DATE OF BIRTH:  07-03-1966  DATE OF ADMISSION:  11/13/2016  PRIMARY CARE PHYSICIAN: Bing Quarry, MD   REQUESTING/REFERRING PHYSICIAN: Dr. Jimmye Norman  CHIEF COMPLAINT:   Chief Complaint  Patient presents with  . Abdominal Pain    HISTORY OF PRESENT ILLNESS:  Bethany Flores  is a 50 y.o. female with a known history of Cholecystectomy, laparoscopic gastric sleeve surgery, depression presents to the emergency room complaining of 3 days of on and off right upper quadrant abdominal pain and right lower chest pain. Related to food. This pain seems similar to her acute cholecystitis in 2016 when she had cholecystectomy. Here in the emergency room CT scan shows dilated intra-and extrahepatic ducts with possible choledocholithiasis. No stones seen. Afebrile. Normal WBC. No vomiting or diarrhea.  PAST MEDICAL HISTORY:   Past Medical History:  Diagnosis Date  . Anemia   . Anxiety   . Depression     PAST SURGICAL HISTORY:   Past Surgical History:  Procedure Laterality Date  . CHOLECYSTECTOMY N/A 04/26/2015   Procedure: LAPAROSCOPIC CHOLECYSTECTOMY WITH INTRAOPERATIVE CHOLANGIOGRAM;  Surgeon: Molly Maduro, MD;  Location: [facility] ORS;  Service: General;  Laterality: N/A;  . COLONOSCOPY WITH PROPOFOL N/A 02/26/2016   Procedure: COLONOSCOPY WITH PROPOFOL;  Surgeon: Lollie Sails, MD;  Location: [facility] ENDOSCOPY;  Service: Endoscopy;  Laterality: N/A;  . LAPAROSCOPIC GASTRIC SLEEVE RESECTION      SOCIAL HISTORY:   Social History  Substance Use Topics  . Smoking status: Never Smoker  . Smokeless tobacco: Never Used  . Alcohol use 0.0 oz/week     Comment: occasionally    FAMILY HISTORY:   Family History  Problem Relation Age of Onset  . Diabetes Paternal Grandmother   . Diabetes Paternal Grandfather     DRUG ALLERGIES:  No Known Allergies  REVIEW OF SYSTEMS:    Review of Systems  Constitutional: Positive for malaise/fatigue. Negative for chills, fever and weight loss.  HENT: Negative for hearing loss and nosebleeds.   Eyes: Negative for blurred vision, double vision and pain.  Respiratory: Negative for cough, hemoptysis, sputum production, shortness of breath and wheezing.   Cardiovascular: Negative for chest pain, palpitations, orthopnea and leg swelling.  Gastrointestinal: Positive for abdominal pain. Negative for constipation, diarrhea, nausea and vomiting.  Genitourinary: Negative for dysuria and hematuria.  Musculoskeletal: Negative for back pain, falls and myalgias.  Skin: Negative for rash.  Neurological: Negative for dizziness, tremors, sensory change, speech change, focal weakness, seizures and headaches.  Endo/Heme/Allergies: Does not bruise/bleed easily.  Psychiatric/Behavioral: Negative for depression and memory loss. The patient is not nervous/anxious.     MEDICATIONS AT HOME:   Prior to Admission medications   Medication Sig Start Date End Date Taking? Authorizing Provider  acetaminophen (TYLENOL) 325 MG tablet Take 650 mg by mouth every 6 (six) hours as needed for moderate pain.   Yes Historical Provider, MD  Calcium Carbonate-Vitamin D 600-200 MG-UNIT TABS Take 1 tablet by mouth daily.   Yes Historical Provider, MD  ibuprofen (ADVIL,MOTRIN) 200 MG tablet Take 400 mg by mouth every 6 (six) hours as needed for moderate pain.    Yes Historical Provider, MD  Multiple Vitamins-Minerals (MULTIVITAMIN ADULT) TABS Take 1 tablet by mouth daily.   Yes Historical Provider, MD  sertraline (ZOLOFT) 50 MG tablet Take 50 mg by mouth daily.   Yes Historical Provider, MD     VITAL SIGNS:  Blood pressure 127/71, pulse 70, temperature 98 F (36.7 C), temperature source Oral, resp. rate 18, height 5\' 6"  (1.676 m), weight 77.1 kg (170 lb), SpO2 100 %.  PHYSICAL EXAMINATION:  Physical Exam  GENERAL:  50 y.o.-year-old patient lying in the bed  with no acute distress.  EYES: Pupils equal, round, reactive to light and accommodation. Positive scleral icterus. Extraocular muscles intact.  HEENT: Head atraumatic, normocephalic. Oropharynx and nasopharynx clear. No oropharyngeal erythema, moist oral mucosa  NECK:  Supple, no jugular venous distention. No thyroid enlargement, no tenderness.  LUNGS: Normal breath sounds bilaterally, no wheezing, rales, rhonchi. No use of accessory muscles of respiration.  CARDIOVASCULAR: S1, S2 normal. No murmurs, rubs, or gallops.  ABDOMEN: Soft, nondistended. Bowel sounds present. No organomegaly or mass. Right upper quadrant and epigastric tenderness EXTREMITIES: No pedal edema, cyanosis, or clubbing. + 2 pedal & radial pulses b/l.   NEUROLOGIC: Cranial nerves II through XII are intact. No focal Motor or sensory deficits appreciated b/l PSYCHIATRIC: The patient is alert and oriented x 3. Good affect.  SKIN: No obvious rash, lesion, or ulcer.   LABORATORY PANEL:   CBC  Recent Labs Lab 11/13/16 1334  WBC 6.9  HGB 12.7  HCT 37.2  PLT 280   ------------------------------------------------------------------------------------------------------------------  Chemistries   Recent Labs Lab 11/13/16 1334  NA 137  K 3.8  CL 105  CO2 22  GLUCOSE 88  BUN 10  CREATININE 0.73  CALCIUM 9.0  AST 112*  ALT 247*  ALKPHOS 341*  BILITOT 5.2*   ------------------------------------------------------------------------------------------------------------------  Cardiac Enzymes No results for input(s): TROPONINI in the last 168 hours. ------------------------------------------------------------------------------------------------------------------  RADIOLOGY:  Ct Abdomen Pelvis W Contrast  Result Date: 11/13/2016 CLINICAL DATA:  Abdominal pain for 3 days. Nausea. Increasing pain today. History of gastric sleeve resection and cholecystectomy. EXAM: CT ABDOMEN AND PELVIS WITH CONTRAST TECHNIQUE:  Multidetector CT imaging of the abdomen and pelvis was performed using the standard protocol following bolus administration of intravenous contrast. CONTRAST:  141mL ISOVUE-300 IOPAMIDOL (ISOVUE-300) INJECTION 61% COMPARISON:  Ultrasound today. CT 04/25/2015. Right thigh MRI 12/30/2015. FINDINGS: Lower chest: Clear lung bases. No significant pleural or pericardial effusion. Hepatobiliary: Interval cholecystectomy compared with previous CT. There is new intra and extrahepatic biliary dilatation. The combined bile duct measures up to 9 mm in diameter. No calcified intraductal stones identified. No focal hepatic lesions. Pancreas: Unremarkable. No pancreatic ductal dilatation or surrounding inflammatory changes. Spleen: Normal in size without focal abnormality. Adrenals/Urinary Tract: Both adrenal glands appear normal. The kidneys appear normal without evidence of urinary tract calculus, suspicious lesion or hydronephrosis. No bladder abnormalities are seen. Stomach/Bowel: No evidence of bowel wall thickening, distention or surrounding inflammatory change. Postsurgical changes related to previous gastric sleeve resection. The appendix appears normal. Vascular/Lymphatic: There are no enlarged abdominal or pelvic lymph nodes. No significant vascular findings are present. Reproductive: Progressive uterine enlargement and heterogeneity. There is a 7.0 x 6.3 cm mass on the left which is likely a large fibroid or focal adenomyosis. Urine pregnancy test is negative. No adnexal mass. A small amount of free pelvic fluid is present. Other: No evidence of abdominal wall mass. Musculoskeletal: No acute or significant osseous findings. Mild lower lumbar spondylosis. IMPRESSION: 1. Interval cholecystectomy with new intra and extrahepatic biliary prominence. Given the patient's liver function studies, these findings are suspicious for possible choledocholithiasis. Consider MRCP for further evaluation. 2. No evidence of bowel  obstruction or intra- abdominal inflammation. The appendix appears normal. 3. Enlarging left uterine mass, likely fibroid or  focal adenomyosis. Electronically Signed   By: Richardean Sale M.D.   On: 11/13/2016 17:58   US Abdomen Limited Ruq  Result Date: 11/13/2016 CLINICAL DATA:  Right upper quadrant pain, epigastric pain since Sunday, worsening today. EXAM: US ABDOMEN LIMITED - RIGHT UPPER QUADRANT COMPARISON:  CT 04/25/2015 FINDINGS: Gallbladder: Prior cholecystectomy Common bile duct: Diameter: Dilated, 10 mm. No visible stones. The distal duct is not visualized due to overlying bowel gas. Liver: No focal lesion identified. Within normal limits in parenchymal echogenicity. Mild intrahepatic biliary ductal dilatation. IMPRESSION: Prior cholecystectomy. Mildly dilated common bile duct and intrahepatic biliary ducts. This may be related to post cholecystectomy state. The distal duct cannot be visualized due to overlying bowel gas. Recommend correlation with LFTs. If further evaluation is felt warranted, MRCP may be beneficial. Electronically Signed   By: Rolm Baptise M.D.   On: 11/13/2016 16:51     IMPRESSION AND PLAN:   * Jaundice Seems most likely has obstructive. Possible choledocholithiasis or stricture. Check MRCP. Consult GI. Check direct bilirubin Repeat labs in the morning. Clear liquids. Nothing by mouth after midnight. No fever. Normal WBC.  DVT prophylaxis with Lovenox   All the records are reviewed and case discussed with ED provider. Management plans discussed with the patient, family and they are in agreement.  CODE STATUS: FULL CODE  TOTAL TIME TAKING CARE OF THIS PATIENT: 40 minutes.   Hillary Bow R M.D on 11/13/2016 at 6:45 PM  Between 7am to 6pm - Pager - [phone]  After 6pm go to [email or web address] - password [email or web address] [facility]  Office  [phone]  CC: Primary care physician; Bing Quarry, MD  Note: This dictation was prepared  with Dragon dictation along with smaller phrase technology. Any transcriptional errors that result from this process are unintentional.

## 2016-11-13 NOTE — ED Notes (Signed)
Patient transported to Ultrasound 

## 2016-11-13 NOTE — ED Notes (Signed)
Pt returned from US

## 2016-11-13 NOTE — ED Triage Notes (Signed)
Pt to ed with c/o abd pain that started on 12/24 and has been intermittent since.  Pt states +nausea.  Denies diarrhea.  Pt reports pain increasing in severity today.

## 2016-11-14 ENCOUNTER — Inpatient Hospital Stay: Payer: BLUE CROSS/BLUE SHIELD

## 2016-11-14 DIAGNOSIS — R17 Unspecified jaundice: Secondary | ICD-10-CM

## 2016-11-14 DIAGNOSIS — R1010 Upper abdominal pain, unspecified: Secondary | ICD-10-CM

## 2016-11-14 DIAGNOSIS — R1011 Right upper quadrant pain: Secondary | ICD-10-CM

## 2016-11-14 LAB — COMPREHENSIVE METABOLIC PANEL
ALBUMIN: 3.4 g/dL — AB (ref 3.5–5.0)
ALK PHOS: 327 U/L — AB (ref 38–126)
ALT: 179 U/L — ABNORMAL HIGH (ref 14–54)
AST: 77 U/L — AB (ref 15–41)
Anion gap: 7 (ref 5–15)
BILIRUBIN TOTAL: 5.9 mg/dL — AB (ref 0.3–1.2)
BUN: 8 mg/dL (ref 6–20)
CALCIUM: 8.4 mg/dL — AB (ref 8.9–10.3)
CO2: 24 mmol/L (ref 22–32)
Chloride: 104 mmol/L (ref 101–111)
Creatinine, Ser: 0.69 mg/dL (ref 0.44–1.00)
GFR calc Af Amer: >60 mL/min (ref >60)
GFR calc non Af Amer: >60 mL/min (ref >60)
GLUCOSE: 66 mg/dL (ref 65–99)
POTASSIUM: 3.8 mmol/L (ref 3.5–5.1)
Sodium: 135 mmol/L (ref 135–145)
TOTAL PROTEIN: 6.9 g/dL (ref 6.5–8.1)

## 2016-11-14 LAB — CBC
HEMATOCRIT: 35 % (ref 35.0–47.0)
HEMOGLOBIN: 11.9 g/dL — AB (ref 12.0–16.0)
MCH: 29.4 pg (ref 26.0–34.0)
MCHC: 34 g/dL (ref 32.0–36.0)
MCV: 86.6 fL (ref 80.0–100.0)
Platelets: 246 10*3/uL (ref 150–440)
RBC: 4.05 MIL/uL (ref 3.80–5.20)
RDW: 13.1 % (ref 11.5–14.5)
WBC: 4.6 10*3/uL (ref 3.6–11.0)

## 2016-11-14 MED ORDER — CALCIUM CARBONATE-VITAMIN D 500-200 MG-UNIT PO TABS
1.0000 | ORAL_TABLET | Freq: Every day | ORAL | Status: DC
  Administered 2016-11-15 – 2016-11-16 (×2): 1 via ORAL
  Filled 2016-11-14 (×2): qty 1

## 2016-11-14 MED ORDER — ADULT MULTIVITAMIN W/MINERALS CH
1.0000 | ORAL_TABLET | Freq: Every day | ORAL | Status: DC
  Administered 2016-11-14 – 2016-11-16 (×3): 1 via ORAL
  Filled 2016-11-14 (×3): qty 1

## 2016-11-14 MED ORDER — GADOBENATE DIMEGLUMINE 529 MG/ML IV SOLN
15.0000 mL | Freq: Once | INTRAVENOUS | Status: AC | PRN
  Administered 2016-11-14: 15 mL via INTRAVENOUS

## 2016-11-14 MED ORDER — MORPHINE SULFATE (PF) 4 MG/ML IV SOLN
2.0000 mg | INTRAVENOUS | Status: DC | PRN
  Administered 2016-11-14: 2 mg via INTRAVENOUS
  Filled 2016-11-14: qty 1

## 2016-11-14 MED ORDER — SERTRALINE HCL 50 MG PO TABS
50.0000 mg | ORAL_TABLET | Freq: Every day | ORAL | Status: DC
  Administered 2016-11-14 – 2016-11-16 (×3): 50 mg via ORAL
  Filled 2016-11-14 (×3): qty 1

## 2016-11-14 MED ORDER — IBUPROFEN 400 MG PO TABS
400.0000 mg | ORAL_TABLET | Freq: Four times a day (QID) | ORAL | Status: DC | PRN
  Administered 2016-11-14: 400 mg via ORAL
  Filled 2016-11-14: qty 1

## 2016-11-14 MED ORDER — INDOMETHACIN 50 MG RE SUPP
100.0000 mg | Freq: Once | RECTAL | Status: AC
  Administered 2016-11-15: 100 mg via RECTAL
  Filled 2016-11-14 (×2): qty 2

## 2016-11-14 MED ORDER — ACETAMINOPHEN 325 MG PO TABS: 650.0000 mg | ORAL_TABLET | Freq: Four times a day (QID) | ORAL | Status: DC | PRN

## 2016-11-14 NOTE — Consult Note (Signed)
Bethany Lame, MD [address] [employer] [place], [place] [address] Phone: [phone] Fax : [phone]  Consultation  Referring Provider:     Dr. Posey Pronto Primary Care Physician:  Bing Quarry, MD Primary Gastroenterologist:  Dr. Gustavo Lah         Reason for Consultation:     Abnormal liver enzymes  Date of Admission:  11/13/2016 Date of Consultation:  11/14/2016         HPI:   Bethany Flores is a 50 y.o. female Who has a history of having her gallbladder out in the past.  The patient states that she had pain that was similar to her gallbladder pain and she came to the emergency room.  The patient was found at that time to have elevated bilirubin and a imaging that showed a dilated common bile duct.  The patient then had an MRCP that showed the patient to have what appears to be a stricture as a possible cause of her abnormal liver enzymes.  The patient's bilirubin is over 5 today.  She reports that the pain started right before Christmas and has not gotten any better.  The patient also reports that her pain is mostly in the epigastric area and and not in the right upper quadrant she denies any nausea or vomiting.  She also denies any black stools or bloody stools. The patient reports that she had been feeling well today until a little while ago when she started to feel the pain getting worse.  Past Medical History:  Diagnosis Date  . Anemia   . Anxiety   . Depression     Past Surgical History:  Procedure Laterality Date  . CHOLECYSTECTOMY N/A 04/26/2015   Procedure: LAPAROSCOPIC CHOLECYSTECTOMY WITH INTRAOPERATIVE CHOLANGIOGRAM;  Surgeon: Molly Maduro, MD;  Location: [facility] ORS;  Service: General;  Laterality: N/A;  . COLONOSCOPY WITH PROPOFOL N/A 02/26/2016   Procedure: COLONOSCOPY WITH PROPOFOL;  Surgeon: Lollie Sails, MD;  Location[facility] ENDOSCOPY;  Service: Endoscopy;  Laterality: N/A;  . LAPAROSCOPIC GASTRIC SLEEVE RESECTION      Prior to Admission medications     Medication Sig Start Date End Date Taking? Authorizing Provider  acetaminophen (TYLENOL) 325 MG tablet Take 650 mg by mouth every 6 (six) hours as needed for moderate pain.   Yes Historical Provider, MD  Calcium Carbonate-Vitamin D 600-200 MG-UNIT TABS Take 1 tablet by mouth daily.   Yes Historical Provider, MD  ibuprofen (ADVIL,MOTRIN) 200 MG tablet Take 400 mg by mouth every 6 (six) hours as needed for moderate pain.    Yes Historical Provider, MD  Multiple Vitamins-Minerals (MULTIVITAMIN ADULT) TABS Take 1 tablet by mouth daily.   Yes Historical Provider, MD  sertraline (ZOLOFT) 50 MG tablet Take 50 mg by mouth daily.   Yes Historical Provider, MD    Family History  Problem Relation Age of Onset  . Diabetes Paternal Grandmother   . Diabetes Paternal Grandfather      Social History  Substance Use Topics  . Smoking status: Never Smoker  . Smokeless tobacco: Never Used  . Alcohol use 0.0 oz/week     Comment: occasionally    Allergies as of 11/13/2016  . (No Known Allergies)    Review of Systems:    All systems reviewed and negative except where noted in HPI.   Physical Exam:  Vital signs in last 24 hours: Temp:  [98.2 F (36.8 C)-98.3 F (36.8 C)] 98.3 F (36.8 C) (12/28 1408) Pulse Rate:  [65-69] 67 (12/28  1408) Resp:  [16-17] 16 (12/28 1408) BP: (121-124)/(60-74) 121/60 (12/28 1408) SpO2:  [99 %-100 %] 100 % (12/28 1408) Last BM Date: 11/13/16 General:   Pleasant, cooperative in NAD Head:  Normocephalic and atraumatic. Eyes:   No icterus.   Conjunctiva pink. PERRLA. Ears:  Normal auditory acuity. Neck:  Supple; no masses or thyroidomegaly Lungs: Respirations even and unlabored. Lungs clear to auscultation bilaterally.   No wheezes, crackles, or rhonchi.  Heart:  Regular rate and rhythm;  Without murmur, clicks, rubs or gallops Abdomen:  Soft, nondistended, nontender. Normal bowel sounds. No appreciable masses or hepatomegaly.  No rebound or guarding.  Rectal:  Not  performed. Msk:  Symmetrical without gross deformities.   Extremities:  Without edema, cyanosis or clubbing. Neurologic:  Alert and oriented x3;  grossly normal neurologically. Skin:  Intact without significant lesions or rashes. Cervical Nodes:  No significant cervical adenopathy. Psych:  Alert and cooperative. Normal affect.  LAB RESULTS:  Recent Labs  11/13/16 1334 11/14/16 0459  WBC 6.9 4.6  HGB 12.7 11.9*  HCT 37.2 35.0  PLT 280 246   BMET  Recent Labs  11/13/16 1334 11/14/16 0459  NA 137 135  K 3.8 3.8  CL 105 104  CO2 22 24  GLUCOSE 88 66  BUN 10 8  CREATININE 0.73 0.69  CALCIUM 9.0 8.4*   LFT  Recent Labs  11/13/16 1334 11/14/16 0459  PROT 7.9 6.9  ALBUMIN 3.9 3.4*  AST 112* 77*  ALT 247* 179*  ALKPHOS 341* 327*  BILITOT 5.2* 5.9*  BILIDIR 2.8*  --    PT/INR No results for input(s): LABPROT, INR in the last 72 hours.  STUDIES: Ct Abdomen Pelvis W Contrast  Result Date: 11/13/2016 CLINICAL DATA:  Abdominal pain for 3 days. Nausea. Increasing pain today. History of gastric sleeve resection and cholecystectomy. EXAM: CT ABDOMEN AND PELVIS WITH CONTRAST TECHNIQUE: Multidetector CT imaging of the abdomen and pelvis was performed using the standard protocol following bolus administration of intravenous contrast. CONTRAST:  158mL ISOVUE-300 IOPAMIDOL (ISOVUE-300) INJECTION 61% COMPARISON:  Ultrasound today. CT 04/25/2015. Right thigh MRI 12/30/2015. FINDINGS: Lower chest: Clear lung bases. No significant pleural or pericardial effusion. Hepatobiliary: Interval cholecystectomy compared with previous CT. There is new intra and extrahepatic biliary dilatation. The combined bile duct measures up to 9 mm in diameter. No calcified intraductal stones identified. No focal hepatic lesions. Pancreas: Unremarkable. No pancreatic ductal dilatation or surrounding inflammatory changes. Spleen: Normal in size without focal abnormality. Adrenals/Urinary Tract: Both adrenal  glands appear normal. The kidneys appear normal without evidence of urinary tract calculus, suspicious lesion or hydronephrosis. No bladder abnormalities are seen. Stomach/Bowel: No evidence of bowel wall thickening, distention or surrounding inflammatory change. Postsurgical changes related to previous gastric sleeve resection. The appendix appears normal. Vascular/Lymphatic: There are no enlarged abdominal or pelvic lymph nodes. No significant vascular findings are present. Reproductive: Progressive uterine enlargement and heterogeneity. There is a 7.0 x 6.3 cm mass on the left which is likely a large fibroid or focal adenomyosis. Urine pregnancy test is negative. No adnexal mass. A small amount of free pelvic fluid is present. Other: No evidence of abdominal wall mass. Musculoskeletal: No acute or significant osseous findings. Mild lower lumbar spondylosis. IMPRESSION: 1. Interval cholecystectomy with new intra and extrahepatic biliary prominence. Given the patient's liver function studies, these findings are suspicious for possible choledocholithiasis. Consider MRCP for further evaluation. 2. No evidence of bowel obstruction or intra- abdominal inflammation. The appendix appears normal. 3. Enlarging left  uterine mass, likely fibroid or focal adenomyosis. Electronically Signed   By: Richardean Sale M.D.   On: 11/13/2016 17:58   Mr Abdomen Mrcp Moise Boring Contast  Result Date: 11/14/2016 CLINICAL DATA:  Three days RIGHT upper quadrant abdominal pain and chest pain. Prior cholecystectomy. Dilated extrahepatic ducts on CT same day. Normal white blood cell count. EXAM: MRI ABDOMEN WITHOUT AND WITH CONTRAST (INCLUDING MRCP) TECHNIQUE: Multiplanar multisequence MR imaging of the abdomen was performed both before and after the administration of intravenous contrast. Heavily T2-weighted images of the biliary and pancreatic ducts were obtained, and three-dimensional MRCP images were rendered by post processing. CONTRAST:   47mL MULTIHANCE GADOBENATE DIMEGLUMINE 529 MG/ML IV SOLN COMPARISON:  CT 11/13/2016 FINDINGS: Lower chest:  Lung bases are clear. Hepatobiliary: There is mild intrahepatic duct dilatation. There is moderate extrahepatic duct dilatation. The common hepatic duct measures 9 mm and the common bile duct measures 9 mm. The duct dilatation is uniform and extends the level of the distal common bile duct. There is angulation of the most distal common bile duct with a narrowing of the most distal common bile duct (5 mm) leading up to the ampulla (image 32, series 10). No filling defect within the common bile duct. No external compression or mass identified. The hepatic parenchyma appears normal without evidence of hepatic steatosis. No hepatic lesion evident. Pancreas: Pancreatic parenchyma is normal signal intensity. There is no pancreatic duct dilatation periods no pancreatic inflammation. Spleen: Normal spleen. Adrenals/urinary tract: Adrenal glands and kidneys are normal. Stomach/Bowel: Stomach and limited of the small bowel is unremarkable Vascular/Lymphatic: Abdominal aortic normal caliber. No retroperitoneal periportal lymphadenopathy. Musculoskeletal: No aggressive osseous lesion IMPRESSION: 1. Mild intrahepatic and moderate extrahepatic biliary duct dilatation. Duct dilatation extends level of the ampulla without without filling defect or external compression identified. There is angulation/kinking of the most distal common bile duct. Potentially this could represent a focus of stricturing. 2. Normal pancreas without evidence of obstruction or inflammation. 3. Normal hepatic parenchyma.  No evidence hepatitis. Electronically Signed   By: Suzy Bouchard M.D.   On: 11/14/2016 12:34   US Abdomen Limited Ruq  Result Date: 11/13/2016 CLINICAL DATA:  Right upper quadrant pain, epigastric pain since Sunday, worsening today. EXAM: US ABDOMEN LIMITED - RIGHT UPPER QUADRANT COMPARISON:  CT 04/25/2015 FINDINGS:  Gallbladder: Prior cholecystectomy Common bile duct: Diameter: Dilated, 10 mm. No visible stones. The distal duct is not visualized due to overlying bowel gas. Liver: No focal lesion identified. Within normal limits in parenchymal echogenicity. Mild intrahepatic biliary ductal dilatation. IMPRESSION: Prior cholecystectomy. Mildly dilated common bile duct and intrahepatic biliary ducts. This may be related to post cholecystectomy state. The distal duct cannot be visualized due to overlying bowel gas. Recommend correlation with LFTs. If further evaluation is felt warranted, MRCP may be beneficial. Electronically Signed   By: Rolm Baptise M.D.   On: 11/13/2016 16:51      Impression / Plan:   KAMILLY HEISERMAN is a 50 y.o. y/o female with a dilated common bile duct on MRCP with a possible biliary stricture and increased bilirubin.  The patient will be set up for an ERCP for tomorrow.  The patient has been explained the procedure including the risk of pancreatitis, infection, bleeding and death.  The patient will also be given indomethacin suppositories at the time of the ERCP to decrease the risk of pancreatitis. The patient will be made nothing by mouth after midnight.  The patient has been explained the plan and agrees  with it.  Thank you for involving me in the care of this patient.      LOS: 1 day   Bethany Lame, MD  11/14/2016, 6:20 PM   Note: This dictation was prepared with Dragon dictation along with smaller phrase technology. Any transcriptional errors that result from this process are unintentional.

## 2016-11-14 NOTE — Progress Notes (Signed)
Bethany Flores at [place] NAME: Bethany Flores    MR#:  [id number]  DATE OF BIRTH:  1966/01/14  SUBJECTIVE:  Came in with right UQ pain  REVIEW OF SYSTEMS:   Review of Systems  Constitutional: Negative for chills, fever and weight loss.  HENT: Negative for ear discharge, ear pain and nosebleeds.   Eyes: Negative for blurred vision, pain and discharge.  Respiratory: Negative for sputum production, shortness of breath, wheezing and stridor.   Cardiovascular: Negative for chest pain, palpitations, orthopnea and PND.  Gastrointestinal: Positive for abdominal pain. Negative for diarrhea, nausea and vomiting.  Genitourinary: Negative for frequency and urgency.  Musculoskeletal: Negative for back pain and joint pain.  Neurological: Negative for sensory change, speech change, focal weakness and weakness.  Psychiatric/Behavioral: Negative for depression and hallucinations. The patient is not nervous/anxious.    Tolerating Diet: Tolerating PT:   DRUG ALLERGIES:  No Known Allergies  VITALS:  Blood pressure 121/60, pulse 67, temperature 98.3 F (36.8 C), temperature source Oral, resp. rate 16, height 5\' 6"  (1.676 m), weight 77.1 kg (170 lb), SpO2 100 %.  PHYSICAL EXAMINATION:   Physical Exam  GENERAL:  50 y.o.-year-old patient lying in the bed with no acute distress.  EYES: Pupils equal, round, reactive to light and accommodation. No scleral icterus. Extraocular muscles intact.  HEENT: Head atraumatic, normocephalic. Oropharynx and nasopharynx clear.  NECK:  Supple, no jugular venous distention. No thyroid enlargement, no tenderness.  LUNGS: Normal breath sounds bilaterally, no wheezing, rales, rhonchi. No use of accessory muscles of respiration.  CARDIOVASCULAR: S1, S2 normal. No murmurs, rubs, or gallops.  ABDOMEN: Soft, nontender, nondistended. Bowel sounds present. No organomegaly or mass.  EXTREMITIES: No cyanosis, clubbing or edema  b/l.    NEUROLOGIC: Cranial nerves II through XII are intact. No focal Motor or sensory deficits b/l.   PSYCHIATRIC:  patient is alert and oriented x 3.  SKIN: No obvious rash, lesion, or ulcer.   LABORATORY PANEL:  CBC  Recent Labs Lab 11/14/16 0459  WBC 4.6  HGB 11.9*  HCT 35.0  PLT 246    Chemistries   Recent Labs Lab 11/14/16 0459  NA 135  K 3.8  CL 104  CO2 24  GLUCOSE 66  BUN 8  CREATININE 0.69  CALCIUM 8.4*  AST 77*  ALT 179*  ALKPHOS 327*  BILITOT 5.9*   Cardiac Enzymes No results for input(s): TROPONINI in the last 168 hours. RADIOLOGY:  Ct Abdomen Pelvis W Contrast  Result Date: 11/13/2016 CLINICAL DATA:  Abdominal pain for 3 days. Nausea. Increasing pain today. History of gastric sleeve resection and cholecystectomy. EXAM: CT ABDOMEN AND PELVIS WITH CONTRAST TECHNIQUE: Multidetector CT imaging of the abdomen and pelvis was performed using the standard protocol following bolus administration of intravenous contrast. CONTRAST:  190mL ISOVUE-300 IOPAMIDOL (ISOVUE-300) INJECTION 61% COMPARISON:  Ultrasound today. CT 04/25/2015. Right thigh MRI 12/30/2015. FINDINGS: Lower chest: Clear lung bases. No significant pleural or pericardial effusion. Hepatobiliary: Interval cholecystectomy compared with previous CT. There is new intra and extrahepatic biliary dilatation. The combined bile duct measures up to 9 mm in diameter. No calcified intraductal stones identified. No focal hepatic lesions. Pancreas: Unremarkable. No pancreatic ductal dilatation or surrounding inflammatory changes. Spleen: Normal in size without focal abnormality. Adrenals/Urinary Tract: Both adrenal glands appear normal. The kidneys appear normal without evidence of urinary tract calculus, suspicious lesion or hydronephrosis. No bladder abnormalities are seen. Stomach/Bowel: No evidence of bowel wall thickening, distention or  surrounding inflammatory change. Postsurgical changes related to previous  gastric sleeve resection. The appendix appears normal. Vascular/Lymphatic: There are no enlarged abdominal or pelvic lymph nodes. No significant vascular findings are present. Reproductive: Progressive uterine enlargement and heterogeneity. There is a 7.0 x 6.3 cm mass on the left which is likely a large fibroid or focal adenomyosis. Urine pregnancy test is negative. No adnexal mass. A small amount of free pelvic fluid is present. Other: No evidence of abdominal wall mass. Musculoskeletal: No acute or significant osseous findings. Mild lower lumbar spondylosis. IMPRESSION: 1. Interval cholecystectomy with new intra and extrahepatic biliary prominence. Given the patient's liver function studies, these findings are suspicious for possible choledocholithiasis. Consider MRCP for further evaluation. 2. No evidence of bowel obstruction or intra- abdominal inflammation. The appendix appears normal. 3. Enlarging left uterine mass, likely fibroid or focal adenomyosis. Electronically Signed   By: Richardean Sale M.D.   On: 11/13/2016 17:58   Mr Abdomen Mrcp Moise Boring Contast  Result Date: 11/14/2016 CLINICAL DATA:  Three days RIGHT upper quadrant abdominal pain and chest pain. Prior cholecystectomy. Dilated extrahepatic ducts on CT same day. Normal white blood cell count. EXAM: MRI ABDOMEN WITHOUT AND WITH CONTRAST (INCLUDING MRCP) TECHNIQUE: Multiplanar multisequence MR imaging of the abdomen was performed both before and after the administration of intravenous contrast. Heavily T2-weighted images of the biliary and pancreatic ducts were obtained, and three-dimensional MRCP images were rendered by post processing. CONTRAST:  81mL MULTIHANCE GADOBENATE DIMEGLUMINE 529 MG/ML IV SOLN COMPARISON:  CT 11/13/2016 FINDINGS: Lower chest:  Lung bases are clear. Hepatobiliary: There is mild intrahepatic duct dilatation. There is moderate extrahepatic duct dilatation. The common hepatic duct measures 9 mm and the common bile duct  measures 9 mm. The duct dilatation is uniform and extends the level of the distal common bile duct. There is angulation of the most distal common bile duct with a narrowing of the most distal common bile duct (5 mm) leading up to the ampulla (image 32, series 10). No filling defect within the common bile duct. No external compression or mass identified. The hepatic parenchyma appears normal without evidence of hepatic steatosis. No hepatic lesion evident. Pancreas: Pancreatic parenchyma is normal signal intensity. There is no pancreatic duct dilatation periods no pancreatic inflammation. Spleen: Normal spleen. Adrenals/urinary tract: Adrenal glands and kidneys are normal. Stomach/Bowel: Stomach and limited of the small bowel is unremarkable Vascular/Lymphatic: Abdominal aortic normal caliber. No retroperitoneal periportal lymphadenopathy. Musculoskeletal: No aggressive osseous lesion IMPRESSION: 1. Mild intrahepatic and moderate extrahepatic biliary duct dilatation. Duct dilatation extends level of the ampulla without without filling defect or external compression identified. There is angulation/kinking of the most distal common bile duct. Potentially this could represent a focus of stricturing. 2. Normal pancreas without evidence of obstruction or inflammation. 3. Normal hepatic parenchyma.  No evidence hepatitis. Electronically Signed   By: Suzy Bouchard M.D.   On: 11/14/2016 12:34   US Abdomen Limited Ruq  Result Date: 11/13/2016 CLINICAL DATA:  Right upper quadrant pain, epigastric pain since Sunday, worsening today. EXAM: US ABDOMEN LIMITED - RIGHT UPPER QUADRANT COMPARISON:  CT 04/25/2015 FINDINGS: Gallbladder: Prior cholecystectomy Common bile duct: Diameter: Dilated, 10 mm. No visible stones. The distal duct is not visualized due to overlying bowel gas. Liver: No focal lesion identified. Within normal limits in parenchymal echogenicity. Mild intrahepatic biliary ductal dilatation. IMPRESSION: Prior  cholecystectomy. Mildly dilated common bile duct and intrahepatic biliary ducts. This may be related to post cholecystectomy state. The distal duct cannot be  visualized due to overlying bowel gas. Recommend correlation with LFTs. If further evaluation is felt warranted, MRCP may be beneficial. Electronically Signed   By: Rolm Baptise M.D.   On: 11/13/2016 16:51   ASSESSMENT AND PLAN:  Deonta Haverty  is a 50 y.o. female with a known history of Cholecystectomy, laparoscopic gastric sleeve surgery, depression presents to the emergency room complaining of 3 days of on and off right upper quadrant abdominal pain and right lower chest pain. Related to food. This pain seems similar to her acute cholecystitis in 2016 when she had cholecystectomy.   * Jaundice Seems most likely has obstructive. Possible choledocholithiasis or stricture. -MRCP resulted noted. Dr Allen Norris to see pt ?ERCP -MRCP showed Mild intrahepatic and moderate extrahepatic biliary ductdilatation. Duct dilatation extends level of the ampulla without filling defect or external compression identified. There is angulation/kinking of the most distal common bile duct -Clear liquids. -No fever. Normal WBC. Total bilirubin 5.2---5.9  *DVT prophylaxis with Lovenox  *Anxiety/Depression on zoloft  Case discussed with Care Management/Social Worker. Management plans discussed with the patient, family and they are in agreement.  CODE STATUS: full  DVT Prophylaxis: lovenox TOTAL TIME TAKING CARE OF THIS PATIENT: 30 minutes.  >50% time spent on counselling and coordination of care  POSSIBLE D/C IN 1-2 DAYS, DEPENDING ON CLINICAL CONDITION.  Note: This dictation was prepared with Dragon dictation along with smaller phrase technology. Any transcriptional errors that result from this process are unintentional.  Dijuan Sleeth M.D on 11/14/2016 at 2:22 PM  Between 7am to 6pm - Pager - [phone]  After 6pm go to [email or web address] - password [email or web address]  [facility]  Office  [phone]  CC: Primary care physician; Bing Quarry, MD

## 2016-11-15 ENCOUNTER — Encounter: Payer: Self-pay | Admitting: *Deleted

## 2016-11-15 ENCOUNTER — Inpatient Hospital Stay: Payer: BLUE CROSS/BLUE SHIELD | Admitting: Anesthesiology

## 2016-11-15 ENCOUNTER — Encounter
Admission: EM | Disposition: A | Discharge status: Home / Self Care | Payer: Self-pay | Source: Home / Self Care | Attending: Internal Medicine

## 2016-11-15 DIAGNOSIS — K839 Disease of biliary tract, unspecified: Secondary | ICD-10-CM

## 2016-11-15 DIAGNOSIS — R748 Abnormal levels of other serum enzymes: Secondary | ICD-10-CM

## 2016-11-15 DIAGNOSIS — K8050 Calculus of bile duct without cholangitis or cholecystitis without obstruction: Secondary | ICD-10-CM

## 2016-11-15 HISTORY — PX: ERCP: SHX5425

## 2016-11-15 SURGERY — ERCP, WITH INTERVENTION IF INDICATED
Anesthesia: General

## 2016-11-15 SURGERY — ENDOSCOPIC RETROGRADE CHOLANGIOPANCREATOGRAPHY (ERCP) WITH PROPOFOL
Anesthesia: Monitor Anesthesia Care

## 2016-11-15 MED ORDER — INDOMETHACIN 25 MG SUPPOSITORY
100.0000 mg | RECTAL | Status: DC
  Filled 2016-11-15: qty 4

## 2016-11-15 MED ORDER — PROPOFOL 500 MG/50ML IV EMUL
INTRAVENOUS | Status: DC | PRN
  Administered 2016-11-15: 120 ug/kg/min via INTRAVENOUS

## 2016-11-15 MED ORDER — GLYCOPYRROLATE 0.2 MG/ML IJ SOLN
INTRAMUSCULAR | Status: DC | PRN
  Administered 2016-11-15: 0.2 mg via INTRAVENOUS

## 2016-11-15 MED ORDER — MIDAZOLAM HCL 5 MG/5ML IJ SOLN
INTRAMUSCULAR | Status: DC | PRN
  Administered 2016-11-15: 2 mg via INTRAVENOUS

## 2016-11-15 MED ORDER — GLYCOPYRROLATE 0.2 MG/ML IJ SOLN
INTRAMUSCULAR | Status: AC
  Filled 2016-11-15: qty 1

## 2016-11-15 MED ORDER — SODIUM CHLORIDE 0.9 % IV SOLN
Freq: Once | INTRAVENOUS | Status: AC
  Administered 2016-11-15: 11:00:00 via INTRAVENOUS

## 2016-11-15 MED ORDER — INDOMETHACIN 50 MG RE SUPP: 100.0000 mg | Freq: Once | RECTAL | Status: AC

## 2016-11-15 MED ORDER — PROPOFOL 10 MG/ML IV BOLUS
INTRAVENOUS | Status: DC | PRN
  Administered 2016-11-15: 70 mg via INTRAVENOUS
  Administered 2016-11-15: 30 mg via INTRAVENOUS

## 2016-11-15 MED ORDER — MIDAZOLAM HCL 2 MG/2ML IJ SOLN
INTRAMUSCULAR | Status: AC
  Filled 2016-11-15: qty 2

## 2016-11-15 MED ORDER — LIDOCAINE 2% (20 MG/ML) 5 ML SYRINGE
INTRAMUSCULAR | Status: DC | PRN
  Administered 2016-11-15: 50 mg via INTRAVENOUS

## 2016-11-15 MED ORDER — LIDOCAINE 2% (20 MG/ML) 5 ML SYRINGE
INTRAMUSCULAR | Status: AC
  Filled 2016-11-15: qty 5

## 2016-11-15 MED ORDER — FENTANYL CITRATE (PF) 100 MCG/2ML IJ SOLN: 25.0000 ug | INTRAMUSCULAR | Status: DC | PRN

## 2016-11-15 MED ORDER — ONDANSETRON HCL 4 MG/2ML IJ SOLN: 4.0000 mg | Freq: Once | INTRAMUSCULAR | Status: DC | PRN

## 2016-11-15 MED ORDER — SODIUM CHLORIDE 0.9 % IV SOLN
INTRAVENOUS | Status: DC | PRN
  Administered 2016-11-15: 11:00:00 via INTRAVENOUS

## 2016-11-15 NOTE — Anesthesia Preprocedure Evaluation (Signed)
Anesthesia Evaluation  Patient identified by MRN, date of birth, ID band Patient awake    Reviewed: Allergy & Precautions, NPO status , Patient's Chart, lab work & pertinent test results, reviewed documented beta blocker date and time   Airway Mallampati: III  TM Distance: >3 FB     Dental  (+) Chipped   Pulmonary           Cardiovascular      Neuro/Psych Anxiety Depression  Neuromuscular disease    GI/Hepatic   Endo/Other    Renal/GU      Musculoskeletal   Abdominal   Peds  Hematology  (+) anemia ,   Anesthesia Other Findings Obese.  Reproductive/Obstetrics                             Anesthesia Physical Anesthesia Plan  ASA: III  Anesthesia Plan: General   Post-op Pain Management:    Induction: Intravenous  Airway Management Planned: Oral ETT  Additional Equipment:   Intra-op Plan:   Post-operative Plan:   Informed Consent: I have reviewed the patients History and Physical, chart, labs and discussed the procedure including the risks, benefits and alternatives for the proposed anesthesia with the patient or authorized representative who has indicated his/her understanding and acceptance.     Plan Discussed with: CRNA  Anesthesia Plan Comments:         Anesthesia Quick Evaluation

## 2016-11-15 NOTE — Op Note (Addendum)
[facility] Patient Name: Bethany Flores Procedure Date: 11/15/2016 11:25 AM MRN: [id number] Account #: [id number] Date of Birth: Aug 02, 1966 Admit Type: Inpatient Age: 50 Room: [facility] ENDO ROOM 4 Gender: Female Note Status: Finalized Procedure:            ERCP Indications:          Biliary dilation on magnetic resonance                        cholangiopancreatography, Jaundice, Elevated liver                        enzymes Providers:            Lucilla Lame MD, MD Referring MD:         Youlanda Roys. Lovie Macadamia, MD (Referring MD) Medicines:            Propofol per Anesthesia Complications:        No immediate complications. Procedure:            Pre-Anesthesia Assessment:                       - Prior to the procedure, a History and Physical was                        performed, and patient medications and allergies were                        reviewed. The patient's tolerance of previous                        anesthesia was also reviewed. The risks and benefits of                        the procedure and the sedation options and risks were                        discussed with the patient. All questions were                        answered, and informed consent was obtained. Prior                        Anticoagulants: The patient has taken no previous                        anticoagulant or antiplatelet agents. ASA Grade                        Assessment: II - A patient with mild systemic disease.                        After reviewing the risks and benefits, the patient was                        deemed in satisfactory condition to undergo the                        procedure.  After obtaining informed consent, the scope was passed                        under direct vision. Throughout the procedure, the                        patient's blood pressure, pulse, and oxygen saturations                        were monitored  continuously. The Endosonoscope was                        introduced through the mouth, and used to inject                        contrast into and used to inject contrast into the bile                        duct. The ERCP was accomplished without difficulty. The                        patient tolerated the procedure well. Findings:      A scout film of the abdomen was obtained. Surgical clips were seen in       the area of the cystic duct. , The major papilla was adjacent to a       diverticulum. The esophagus was successfully intubated under direct       vision. The scope was advanced to a normal major papilla in the       descending duodenum without detailed examination of the pharynx, larynx       and associated structures, and upper GI tract. The upper GI tract was       grossly normal. The bile duct was deeply cannulated with the short-nosed       traction sphincterotome. Contrast was injected. I personally interpreted       the bile duct images. There was brisk flow of contrast through the       ducts. Image quality was excellent. Contrast extended to the entire       biliary tree. The upper third of the main bile duct contained one stone       mm. A straight Roadrunner wire was passed into the biliary tree. A 7 mm       biliary sphincterotomy was made with a traction (standard)       sphincterotome using ERBE electrocautery. There was no       post-sphincterotomy bleeding. The biliary tree was swept with a 15 mm       balloon starting at the bifurcation. One stone was removed. No stones       remained. One 10 Fr by 5 cm plastic stent was placed into the common       bile duct. Bile flowed through the stent. The stent was in good position. Impression:           - The major papilla was adjacent to a diverticulum.                       - Choledocholithiasis was found. Complete removal was  accomplished by biliary sphincterotomy and balloon                         extraction.                       - A biliary sphincterotomy was performed.                       - The biliary tree was swept.                       - One plastic stent was placed into the common bile                        duct due to poor drainage even after stone was removed.                        The diverticuli was likely the cause of the decreased                        drainange. Recommendation:       - Watch for pancreatitis, bleeding, perforation, and                        cholangitis.                       - Repeat ERCP in 2 months to remove stent. Procedure Code(s):    --- Professional ---                       [phone], Endoscopic retrograde cholangiopancreatography                        (ERCP); with placement of endoscopic stent into biliary                        or pancreatic duct, including pre- and post-dilation                        and guide wire passage, when performed, including                        sphincterotomy, when performed, each stent                       [id number], Endoscopic retrograde cholangiopancreatography                        (ERCP); with removal of calculi/debris from                        biliary/pancreatic duct(s)                       [id number], Endoscopic catheterization of the biliary ductal                        system, radiological supervision and interpretation Diagnosis Code(s):    --- Professional ---                       K80.50, Calculus of  bile duct without cholangitis or                        cholecystitis without obstruction                       K83.9, Disease of biliary tract, unspecified                       R17, Unspecified jaundice                       R74.8, Abnormal levels of other serum enzymes CPT copyright 2016 [employer]. All rights reserved. The codes documented in this report are preliminary and upon coder review may  be revised to meet current compliance requirements. Lucilla Lame MD, MD 11/15/2016  11:59:50 AM This report has been signed electronically. Number of Addenda: 0 Note Initiated On: 11/15/2016 11:25 AM      [facility]

## 2016-11-15 NOTE — Progress Notes (Signed)
[facility] [place] NAME: Bethany Flores    MR#:  [id number]  DATE OF BIRTH:  Apr 07, 1966  SUBJECTIVE:  Came in with right UQ pain Awaiting ERCP. REVIEW OF SYSTEMS:   Review of Systems  Constitutional: Negative for chills, fever and weight loss.  HENT: Negative for ear discharge, ear pain and nosebleeds.   Eyes: Negative for blurred vision, pain and discharge.  Respiratory: Negative for sputum production, shortness of breath, wheezing and stridor.   Cardiovascular: Negative for chest pain, palpitations, orthopnea and PND.  Gastrointestinal: Positive for abdominal pain. Negative for diarrhea, nausea and vomiting.  Genitourinary: Negative for frequency and urgency.  Musculoskeletal: Negative for back pain and joint pain.  Neurological: Negative for sensory change, speech change, focal weakness and weakness.  Psychiatric/Behavioral: Negative for depression and hallucinations. The patient is not nervous/anxious.    Tolerating Diet:Nothing by mouth Tolerating PT: Not needed  DRUG ALLERGIES:  No Known Allergies  VITALS:  Blood pressure 126/82, pulse (!) 59, temperature (!) 96.5 F (35.8 C), temperature source Tympanic, resp. rate 15, height 5\' 6"  (1.676 m), weight 77.1 kg (170 lb), SpO2 100 %.  PHYSICAL EXAMINATION:   Physical Exam  GENERAL:  50 y.o.-year-old patient lying in the bed with no acute distress.  EYES: Pupils equal, round, reactive to light and accommodation. No scleral icterus. Extraocular muscles intact.  HEENT: Head atraumatic, normocephalic. Oropharynx and nasopharynx clear.  NECK:  Supple, no jugular venous distention. No thyroid enlargement, no tenderness.  LUNGS: Normal breath sounds bilaterally, no wheezing, rales, rhonchi. No use of accessory muscles of respiration.  CARDIOVASCULAR: S1, S2 normal. No murmurs, rubs, or gallops.  ABDOMEN: Soft, nontender, nondistended. Bowel sounds present. No organomegaly or  mass.  EXTREMITIES: No cyanosis, clubbing or edema b/l.    NEUROLOGIC: Cranial nerves II through XII are intact. No focal Motor or sensory deficits b/l.   PSYCHIATRIC:  patient is alert and oriented x 3.  SKIN: No obvious rash, lesion, or ulcer.   LABORATORY PANEL:  CBC  Recent Labs Lab 11/14/16 0459  WBC 4.6  HGB 11.9*  HCT 35.0  PLT 246    Chemistries   Recent Labs Lab 11/14/16 0459  NA 135  K 3.8  CL 104  CO2 24  GLUCOSE 66  BUN 8  CREATININE 0.69  CALCIUM 8.4*  AST 77*  ALT 179*  ALKPHOS 327*  BILITOT 5.9*   Cardiac Enzymes No results for input(s): TROPONINI in the last 168 hours. RADIOLOGY:  Ct Abdomen Pelvis W Contrast  Result Date: 11/13/2016 CLINICAL DATA:  Abdominal pain for 3 days. Nausea. Increasing pain today. History of gastric sleeve resection and cholecystectomy. EXAM: CT ABDOMEN AND PELVIS WITH CONTRAST TECHNIQUE: Multidetector CT imaging of the abdomen and pelvis was performed using the standard protocol following bolus administration of intravenous contrast. CONTRAST:  149mL ISOVUE-300 IOPAMIDOL (ISOVUE-300) INJECTION 61% COMPARISON:  Ultrasound today. CT 04/25/2015. Right thigh MRI 12/30/2015. FINDINGS: Lower chest: Clear lung bases. No significant pleural or pericardial effusion. Hepatobiliary: Interval cholecystectomy compared with previous CT. There is new intra and extrahepatic biliary dilatation. The combined bile duct measures up to 9 mm in diameter. No calcified intraductal stones identified. No focal hepatic lesions. Pancreas: Unremarkable. No pancreatic ductal dilatation or surrounding inflammatory changes. Spleen: Normal in size without focal abnormality. Adrenals/Urinary Tract: Both adrenal glands appear normal. The kidneys appear normal without evidence of urinary tract calculus, suspicious lesion or hydronephrosis. No bladder abnormalities are seen. Stomach/Bowel: No evidence  of bowel wall thickening, distention or surrounding inflammatory  change. Postsurgical changes related to previous gastric sleeve resection. The appendix appears normal. Vascular/Lymphatic: There are no enlarged abdominal or pelvic lymph nodes. No significant vascular findings are present. Reproductive: Progressive uterine enlargement and heterogeneity. There is a 7.0 x 6.3 cm mass on the left which is likely a large fibroid or focal adenomyosis. Urine pregnancy test is negative. No adnexal mass. A small amount of free pelvic fluid is present. Other: No evidence of abdominal wall mass. Musculoskeletal: No acute or significant osseous findings. Mild lower lumbar spondylosis. IMPRESSION: 1. Interval cholecystectomy with new intra and extrahepatic biliary prominence. Given the patient's liver function studies, these findings are suspicious for possible choledocholithiasis. Consider MRCP for further evaluation. 2. No evidence of bowel obstruction or intra- abdominal inflammation. The appendix appears normal. 3. Enlarging left uterine mass, likely fibroid or focal adenomyosis. Electronically Signed   By: Richardean Sale M.D.   On: 11/13/2016 17:58   Mr Abdomen Mrcp Moise Boring Contast  Result Date: 11/14/2016 CLINICAL DATA:  Three days RIGHT upper quadrant abdominal pain and chest pain. Prior cholecystectomy. Dilated extrahepatic ducts on CT same day. Normal white blood cell count. EXAM: MRI ABDOMEN WITHOUT AND WITH CONTRAST (INCLUDING MRCP) TECHNIQUE: Multiplanar multisequence MR imaging of the abdomen was performed both before and after the administration of intravenous contrast. Heavily T2-weighted images of the biliary and pancreatic ducts were obtained, and three-dimensional MRCP images were rendered by post processing. CONTRAST:  66mL MULTIHANCE GADOBENATE DIMEGLUMINE 529 MG/ML IV SOLN COMPARISON:  CT 11/13/2016 FINDINGS: Lower chest:  Lung bases are clear. Hepatobiliary: There is mild intrahepatic duct dilatation. There is moderate extrahepatic duct dilatation. The common hepatic  duct measures 9 mm and the common bile duct measures 9 mm. The duct dilatation is uniform and extends the level of the distal common bile duct. There is angulation of the most distal common bile duct with a narrowing of the most distal common bile duct (5 mm) leading up to the ampulla (image 32, series 10). No filling defect within the common bile duct. No external compression or mass identified. The hepatic parenchyma appears normal without evidence of hepatic steatosis. No hepatic lesion evident. Pancreas: Pancreatic parenchyma is normal signal intensity. There is no pancreatic duct dilatation periods no pancreatic inflammation. Spleen: Normal spleen. Adrenals/urinary tract: Adrenal glands and kidneys are normal. Stomach/Bowel: Stomach and limited of the small bowel is unremarkable Vascular/Lymphatic: Abdominal aortic normal caliber. No retroperitoneal periportal lymphadenopathy. Musculoskeletal: No aggressive osseous lesion IMPRESSION: 1. Mild intrahepatic and moderate extrahepatic biliary duct dilatation. Duct dilatation extends level of the ampulla without without filling defect or external compression identified. There is angulation/kinking of the most distal common bile duct. Potentially this could represent a focus of stricturing. 2. Normal pancreas without evidence of obstruction or inflammation. 3. Normal hepatic parenchyma.  No evidence hepatitis. Electronically Signed   By: Suzy Bouchard M.D.   On: 11/14/2016 12:34   US Abdomen Limited Ruq  Result Date: 11/13/2016 CLINICAL DATA:  Right upper quadrant pain, epigastric pain since Sunday, worsening today. EXAM: US ABDOMEN LIMITED - RIGHT UPPER QUADRANT COMPARISON:  CT 04/25/2015 FINDINGS: Gallbladder: Prior cholecystectomy Common bile duct: Diameter: Dilated, 10 mm. No visible stones. The distal duct is not visualized due to overlying bowel gas. Liver: No focal lesion identified. Within normal limits in parenchymal echogenicity. Mild intrahepatic  biliary ductal dilatation. IMPRESSION: Prior cholecystectomy. Mildly dilated common bile duct and intrahepatic biliary ducts. This may be related to post cholecystectomy  state. The distal duct cannot be visualized due to overlying bowel gas. Recommend correlation with LFTs. If further evaluation is felt warranted, MRCP may be beneficial. Electronically Signed   By: Rolm Baptise M.D.   On: 11/13/2016 16:51   ASSESSMENT AND PLAN:  Trane Yusko  is a 50 y.o. female with a known history of Cholecystectomy, laparoscopic gastric sleeve surgery, depression presents to the emergency room complaining of 3 days of on and off right upper quadrant abdominal pain and right lower chest pain. Related to food. This pain seems similar to her acute cholecystitis in 2016 when she had cholecystectomy.   * Jaundice obstructive.Due to choledocholithiasis  -MRCP resulted noted. Appreciated Dr Allen Norris input -MRCP showed Mild intrahepatic and moderate extrahepatic biliary ductdilatation. Duct dilatation extends level of the ampulla without filling defect or external compression identified. There is angulation/kinking of the most distal common bile duct -Patient is status post ERCP removal of stone from the CBD. Status post stent placement 11/15/2016 -Clear liquids. -No fever. Normal WBC. Total bilirubin 5.2---5.9--LFTs tomorrow  *DVT prophylaxis with Lovenox  *Anxiety/Depression on zoloft  Case discussed with Care Management/Social Worker. Management plans discussed with the patient, family and they are in agreement.  CODE STATUS: full  DVT Prophylaxis: lovenox TOTAL TIME TAKING CARE OF THIS PATIENT: 30 minutes.  >50% time spent on counselling and coordination of care  POSSIBLE D/C IN 1-2 DAYS, DEPENDING ON CLINICAL CONDITION.  Note: This dictation was prepared with Dragon dictation along with smaller phrase technology. Any transcriptional errors that result from this process are unintentional.  Lakisha Peyser M.D  on 11/15/2016 at 2:10 PM  Between 7am to 6pm - Pager - [phone]  After 6pm go to [email or web address] - password [email or web address] [facility]  Office  [phone]  CC: Primary care physician; Bing Quarry, MD

## 2016-11-15 NOTE — Anesthesia Postprocedure Evaluation (Signed)
Anesthesia Post Note  Patient: UMIKA BRENA  Procedure(s) Performed: Procedure(s) (LRB): ENDOSCOPIC RETROGRADE CHOLANGIOPANCREATOGRAPHY (ERCP) (N/A)  Patient location during evaluation: Endoscopy Anesthesia Type: General Level of consciousness: awake and alert Pain management: pain level controlled Vital Signs Assessment: post-procedure vital signs reviewed and stable Respiratory status: spontaneous breathing, nonlabored ventilation, respiratory function stable and patient connected to nasal cannula oxygen Cardiovascular status: blood pressure returned to baseline and stable Postop Assessment: no signs of nausea or vomiting Anesthetic complications: no     Last Vitals:  Vitals:   11/15/16 1307 11/15/16 2042  BP: 120/80 106/68  Pulse:  60  Resp: 16 20  Temp: 36.5 C 36.8 C    Last Pain:  Vitals:   11/15/16 2042  TempSrc: Oral  PainSc:                  Legaci Tarman S

## 2016-11-15 NOTE — Transfer of Care (Signed)
Immediate Anesthesia Transfer of Care Note  Patient: Bethany Flores  Procedure(s) Performed: Procedure(s) with comments: ENDOSCOPIC RETROGRADE CHOLANGIOPANCREATOGRAPHY (ERCP) (N/A) - Inpatient RM 218  Patient Location: Endoscopy Unit  Anesthesia Type:General  Level of Consciousness: awake and alert   Airway & Oxygen Therapy: Patient Spontanous Breathing and Patient connected to nasal cannula oxygen  Post-op Assessment: Report given to RN and Post -op Vital signs reviewed and stable  Post vital signs: Reviewed  Last Vitals:  Vitals:   11/15/16 1102 11/15/16 1158  BP: (!) 107/59 124/83  Pulse: (!) 58 70  Resp: 12 16  Temp: 36.7 C (!) 35.8 C    Last Pain:  Vitals:   11/15/16 1102  TempSrc: Tympanic  PainSc:       Patients Stated Pain Goal: 0 ([id number])  Complications: No apparent anesthesia complications

## 2016-11-16 LAB — HEPATIC FUNCTION PANEL
ALK PHOS: 301 U/L — AB (ref 38–126)
ALT: 164 U/L — AB (ref 14–54)
AST: 115 U/L — AB (ref 15–41)
Albumin: 3.3 g/dL — ABNORMAL LOW (ref 3.5–5.0)
BILIRUBIN DIRECT: 1.2 mg/dL — AB (ref 0.1–0.5)
BILIRUBIN INDIRECT: 2 mg/dL — AB (ref 0.3–0.9)
BILIRUBIN TOTAL: 3.2 mg/dL — AB (ref 0.3–1.2)
Total Protein: 6.9 g/dL (ref 6.5–8.1)

## 2016-11-16 LAB — LIPASE, BLOOD: Lipase: 24 U/L (ref 11–51)

## 2016-11-16 NOTE — Progress Notes (Signed)
Pt to be discharged per MD order. Iv removed. Instructions reviewed with pt and family, all questions answered. Taken out in wheelchair.

## 2016-11-16 NOTE — Discharge Summary (Signed)
[facility] at [place] NAME: Bethany Flores    MR#:  [id number]  DATE OF BIRTH:  1966-01-30  DATE OF ADMISSION:  11/13/2016 ADMITTING PHYSICIAN: Hillary Bow, MD  DATE OF DISCHARGE: 11/16/16  PRIMARY CARE PHYSICIAN: Bing Quarry, MD    ADMISSION DIAGNOSIS:  Choledocholithiasis [K80.50] Jaundice [R17] Pain of upper abdomen [R10.10]  DISCHARGE DIAGNOSIS:  Obstructive Jaundice due to Choledocholithiasis [K80.50] s/p ERCP with stone removal and stent placement   SECONDARY DIAGNOSIS:   Past Medical History:  Diagnosis Date  . Anemia   . Anxiety   . Depression     HOSPITAL COURSE:  MarthaWhitmanis a 50 y.o.femalewith a known history of Cholecystectomy, laparoscopic gastric sleeve surgery, depression presents to the emergency room complaining of 3 days of on and off right upper quadrant abdominal pain and right lower chest pain. Related to food. This pain seems similar to her acute cholecystitis in 2016 when she had cholecystectomy.   * Jaundice obstructive Due to choledocholithiasis  -MRCP resulted noted. Appreciated Dr Allen Norris input -MRCP showed Mild intrahepatic and moderate extrahepatic biliary ductdilatation. Duct dilatation extends level of the ampulla without filling defect or external compression identified. There is angulation/kinking of the most distal common bile duct -Patient is status post ERCP removal of stone from the CBD. Status post stent placement 11/15/2016 -Clear liquids. -No fever. Normal WBC. Total bilirubin 5.2---5.9--3.2 asymptomatic LFTs ok, lipase wnl  *DVT prophylaxis with Lovenox  *Anxiety/Depression on zoloft  Overall stable and d/c home Pt agreeable CONSULTS OBTAINED:  Treatment Team:  Lucilla Lame, MD  DRUG ALLERGIES:  No Known Allergies  DISCHARGE MEDICATIONS:   Current Discharge Medication List    CONTINUE these medications which have NOT CHANGED   Details  acetaminophen  (TYLENOL) 325 MG tablet Take 650 mg by mouth every 6 (six) hours as needed for moderate pain.    Calcium Carbonate-Vitamin D 600-200 MG-UNIT TABS Take 1 tablet by mouth daily.    ibuprofen (ADVIL,MOTRIN) 200 MG tablet Take 400 mg by mouth every 6 (six) hours as needed for moderate pain.     Multiple Vitamins-Minerals (MULTIVITAMIN ADULT) TABS Take 1 tablet by mouth daily.    sertraline (ZOLOFT) 50 MG tablet Take 50 mg by mouth daily.        If you experience worsening of your admission symptoms, develop shortness of breath, life threatening emergency, suicidal or homicidal thoughts you must seek medical attention immediately by calling 911 or calling your MD immediately  if symptoms less severe.  You Must read complete instructions/literature along with all the possible adverse reactions/side effects for all the Medicines you take and that have been prescribed to you. Take any new Medicines after you have completely understood and accept all the possible adverse reactions/side effects.   Please note  You were cared for by a hospitalist during your hospital stay. If you have any questions about your discharge medications or the care you received while you were in the hospital after you are discharged, you can call the unit and asked to speak with the hospitalist on call if the hospitalist that took care of you is not available. Once you are discharged, your primary care physician will handle any further medical issues. Please note that NO REFILLS for any discharge medications will be authorized once you are discharged, as it is imperative that you return to your primary care physician (or establish a relationship with a primary care physician if you do not have one) for your  aftercare needs so that they can reassess your need for medications and monitor your lab values. Today   SUBJECTIVE  No complaints   VITAL SIGNS:  Blood pressure 105/60, pulse 66, temperature 98.4 F (36.9 C),  temperature source Oral, resp. rate 20, height 5\' 6"  (1.676 m), weight 77.1 kg (170 lb), SpO2 98 %.  I/O:   Intake/Output Summary (Last 24 hours) at 11/16/16 0825 Last data filed at 11/16/16 [id number]  Gross per 24 hour  Intake              640 ml  Output             1800 ml  Net            -1160 ml    PHYSICAL EXAMINATION:  GENERAL:  50 y.o.-year-old patient lying in the bed with no acute distress.  EYES: Pupils equal, round, reactive to light and accommodation. No scleral icterus. Extraocular muscles intact.  HEENT: Head atraumatic, normocephalic. Oropharynx and nasopharynx clear.  NECK:  Supple, no jugular venous distention. No thyroid enlargement, no tenderness.  LUNGS: Normal breath sounds bilaterally, no wheezing, rales,rhonchi or crepitation. No use of accessory muscles of respiration.  CARDIOVASCULAR: S1, S2 normal. No murmurs, rubs, or gallops.  ABDOMEN: Soft, non-tender, non-distended. Bowel sounds present. No organomegaly or mass.  EXTREMITIES: No pedal edema, cyanosis, or clubbing.  NEUROLOGIC: Cranial nerves II through XII are intact. Muscle strength 5/5 in all extremities. Sensation intact. Gait not checked.  PSYCHIATRIC: The patient is alert and oriented x 3.  SKIN: No obvious rash, lesion, or ulcer.   DATA REVIEW:   CBC   Recent Labs Lab 11/14/16 0459  WBC 4.6  HGB 11.9*  HCT 35.0  PLT 246    Chemistries   Recent Labs Lab 11/14/16 0459 11/16/16 0525  NA 135  --   K 3.8  --   CL 104  --   CO2 24  --   GLUCOSE 66  --   BUN 8  --   CREATININE 0.69  --   CALCIUM 8.4*  --   AST 77* 115*  ALT 179* 164*  ALKPHOS 327* 301*  BILITOT 5.9* 3.2*    Microbiology Results   No results found for this or any previous visit (from the past 240 hour(s)).  RADIOLOGY:  Mr Abdomen Mrcp W Wo Contast  Result Date: 11/14/2016 CLINICAL DATA:  Three days RIGHT upper quadrant abdominal pain and chest pain. Prior cholecystectomy. Dilated extrahepatic ducts on CT same  day. Normal white blood cell count. EXAM: MRI ABDOMEN WITHOUT AND WITH CONTRAST (INCLUDING MRCP) TECHNIQUE: Multiplanar multisequence MR imaging of the abdomen was performed both before and after the administration of intravenous contrast. Heavily T2-weighted images of the biliary and pancreatic ducts were obtained, and three-dimensional MRCP images were rendered by post processing. CONTRAST:  36mL MULTIHANCE GADOBENATE DIMEGLUMINE 529 MG/ML IV SOLN COMPARISON:  CT 11/13/2016 FINDINGS: Lower chest:  Lung bases are clear. Hepatobiliary: There is mild intrahepatic duct dilatation. There is moderate extrahepatic duct dilatation. The common hepatic duct measures 9 mm and the common bile duct measures 9 mm. The duct dilatation is uniform and extends the level of the distal common bile duct. There is angulation of the most distal common bile duct with a narrowing of the most distal common bile duct (5 mm) leading up to the ampulla (image 32, series 10). No filling defect within the common bile duct. No external compression or mass identified. The hepatic parenchyma appears normal  without evidence of hepatic steatosis. No hepatic lesion evident. Pancreas: Pancreatic parenchyma is normal signal intensity. There is no pancreatic duct dilatation periods no pancreatic inflammation. Spleen: Normal spleen. Adrenals/urinary tract: Adrenal glands and kidneys are normal. Stomach/Bowel: Stomach and limited of the small bowel is unremarkable Vascular/Lymphatic: Abdominal aortic normal caliber. No retroperitoneal periportal lymphadenopathy. Musculoskeletal: No aggressive osseous lesion IMPRESSION: 1. Mild intrahepatic and moderate extrahepatic biliary duct dilatation. Duct dilatation extends level of the ampulla without without filling defect or external compression identified. There is angulation/kinking of the most distal common bile duct. Potentially this could represent a focus of stricturing. 2. Normal pancreas without evidence  of obstruction or inflammation. 3. Normal hepatic parenchyma.  No evidence hepatitis. Electronically Signed   By: Suzy Bouchard M.D.   On: 11/14/2016 12:34     Management plans discussed with the patient, family and they are in agreement.  CODE STATUS:     Code Status Orders        Start     Ordered   11/13/16 1844  Full code  Continuous     11/13/16 1844    Code Status History    Date Active Date Inactive Code Status Order ID Comments User Context   04/26/2015  1:03 PM 04/27/2015  3:05 PM Full Code ID:[id number]  Molly Maduro, MD Inpatient   04/25/2015  2:10 PM 04/26/2015  1:03 PM Full Code [id number]  Molly Maduro, MD ED    Advance Directive Documentation   Flowsheet Row Most Recent Value  Type of Advance Directive  Living will, Healthcare Power of Attorney  Pre-existing out of facility DNR order (yellow form or pink MOST form)  No data  "MOST" Form in Place?  No data      TOTAL TIME TAKING CARE OF THIS PATIENT: 40 minutes.    Kyandre Okray M.D on 11/16/2016 at 8:25 AM  Between 7am to 6pm - Pager - [phone] After 6pm go to [email or web address] - password [email or web address]  Office  [phone]  CC: Primary care physician; Bing Quarry, MD

## 2016-11-19 ENCOUNTER — Encounter: Payer: Self-pay | Admitting: Gastroenterology

## 2016-11-20 ENCOUNTER — Telehealth: Payer: Self-pay | Admitting: Gastroenterology

## 2016-11-20 NOTE — Telephone Encounter (Signed)
Patient left a voice message that Dr. Allen Norris did a ERCP 12/29 and she is still having pain and feels something just isn't right. She would like to talk to you

## 2016-11-21 ENCOUNTER — Other Ambulatory Visit: Payer: Self-pay

## 2016-11-22 NOTE — Telephone Encounter (Signed)
Spoke with pt and she stated the pain has gone away and she is feeling okay. Pt has been scheduled for a ERCP stent removal on 01/14/17. Advised her to contact me if pain returns and worsens. Pt verbalized understanding.

## 2016-11-28 ENCOUNTER — Ambulatory Visit: Payer: BLUE CROSS/BLUE SHIELD

## 2016-12-26 ENCOUNTER — Ambulatory Visit
Admission: RE | Admit: 2016-12-26 | Discharge: 2016-12-26 | Disposition: A | Discharge status: Home / Self Care | Payer: BLUE CROSS/BLUE SHIELD | Source: Ambulatory Visit | Attending: Family Medicine | Admitting: Family Medicine

## 2016-12-26 DIAGNOSIS — Z1231 Encounter for screening mammogram for malignant neoplasm of breast: Secondary | ICD-10-CM | POA: Insufficient documentation

## 2017-01-14 ENCOUNTER — Ambulatory Visit
Admission: RE | Admit: 2017-01-14 | Discharge: 2017-01-14 | Disposition: A | Discharge status: Home / Self Care | Payer: BLUE CROSS/BLUE SHIELD | Source: Ambulatory Visit | Attending: Gastroenterology | Admitting: Gastroenterology

## 2017-01-14 ENCOUNTER — Encounter
Admission: RE | Disposition: A | Discharge status: Home / Self Care | Payer: Self-pay | Source: Ambulatory Visit | Attending: Gastroenterology

## 2017-01-14 ENCOUNTER — Ambulatory Visit: Payer: BLUE CROSS/BLUE SHIELD | Admitting: Registered Nurse

## 2017-01-14 DIAGNOSIS — Z833 Family history of diabetes mellitus: Secondary | ICD-10-CM | POA: Diagnosis not present

## 2017-01-14 DIAGNOSIS — Z9049 Acquired absence of other specified parts of digestive tract: Secondary | ICD-10-CM | POA: Diagnosis not present

## 2017-01-14 DIAGNOSIS — Z9889 Other specified postprocedural states: Secondary | ICD-10-CM | POA: Insufficient documentation

## 2017-01-14 DIAGNOSIS — Z4659 Encounter for fitting and adjustment of other gastrointestinal appliance and device: Secondary | ICD-10-CM | POA: Diagnosis not present

## 2017-01-14 DIAGNOSIS — Z9884 Bariatric surgery status: Secondary | ICD-10-CM | POA: Insufficient documentation

## 2017-01-14 DIAGNOSIS — K838 Other specified diseases of biliary tract: Secondary | ICD-10-CM | POA: Diagnosis not present

## 2017-01-14 DIAGNOSIS — F329 Major depressive disorder, single episode, unspecified: Secondary | ICD-10-CM | POA: Insufficient documentation

## 2017-01-14 DIAGNOSIS — Z791 Long term (current) use of non-steroidal anti-inflammatories (NSAID): Secondary | ICD-10-CM | POA: Diagnosis not present

## 2017-01-14 DIAGNOSIS — Z79899 Other long term (current) drug therapy: Secondary | ICD-10-CM | POA: Diagnosis not present

## 2017-01-14 DIAGNOSIS — F419 Anxiety disorder, unspecified: Secondary | ICD-10-CM | POA: Diagnosis not present

## 2017-01-14 HISTORY — PX: ERCP: SHX5425

## 2017-01-14 LAB — POCT PREGNANCY, URINE: Preg Test, Ur: NEGATIVE

## 2017-01-14 SURGERY — ERCP, WITH INTERVENTION IF INDICATED
Anesthesia: General

## 2017-01-14 MED ORDER — MIDAZOLAM HCL 2 MG/2ML IJ SOLN
INTRAMUSCULAR | Status: AC
  Filled 2017-01-14: qty 2

## 2017-01-14 MED ORDER — SODIUM CHLORIDE 0.9 % IV SOLN
INTRAVENOUS | Status: DC
  Administered 2017-01-14: 1000 mL via INTRAVENOUS

## 2017-01-14 MED ORDER — LIDOCAINE HCL (PF) 2 % IJ SOLN
INTRAMUSCULAR | Status: DC | PRN
  Administered 2017-01-14: 60 mg via INTRADERMAL

## 2017-01-14 MED ORDER — PROPOFOL 500 MG/50ML IV EMUL
INTRAVENOUS | Status: DC | PRN
  Administered 2017-01-14: 200 ug/kg/min via INTRAVENOUS

## 2017-01-14 MED ORDER — FENTANYL CITRATE (PF) 100 MCG/2ML IJ SOLN
INTRAMUSCULAR | Status: DC | PRN
  Administered 2017-01-14: 50 ug via INTRAVENOUS

## 2017-01-14 MED ORDER — FENTANYL CITRATE (PF) 100 MCG/2ML IJ SOLN
INTRAMUSCULAR | Status: AC
  Filled 2017-01-14: qty 2

## 2017-01-14 MED ORDER — GLYCOPYRROLATE 0.2 MG/ML IJ SOLN
INTRAMUSCULAR | Status: AC
  Filled 2017-01-14: qty 1

## 2017-01-14 MED ORDER — LIDOCAINE HCL (PF) 2 % IJ SOLN
INTRAMUSCULAR | Status: AC
  Filled 2017-01-14: qty 2

## 2017-01-14 MED ORDER — PROPOFOL 10 MG/ML IV BOLUS
INTRAVENOUS | Status: DC | PRN
  Administered 2017-01-14: 70 mg via INTRAVENOUS

## 2017-01-14 MED ORDER — PROPOFOL 500 MG/50ML IV EMUL
INTRAVENOUS | Status: AC
  Filled 2017-01-14: qty 50

## 2017-01-14 MED ORDER — GLYCOPYRROLATE 0.2 MG/ML IJ SOLN
INTRAMUSCULAR | Status: DC | PRN
  Administered 2017-01-14: .2 mg via INTRAVENOUS

## 2017-01-14 MED ORDER — MIDAZOLAM HCL 2 MG/2ML IJ SOLN
INTRAMUSCULAR | Status: DC | PRN
  Administered 2017-01-14: 1 mg via INTRAVENOUS

## 2017-01-14 NOTE — Op Note (Signed)
[facility] Gastroenterology Patient Name: Bethany Flores Procedure Date: 01/14/2017 9:51 AM MRN: [id number] Account #: [id number] Date of Birth: 1965-11-29 Admit Type: Outpatient Age: 51 Room: [facility] ENDO ROOM 4 Gender: Female Note Status: Finalized Procedure:            ERCP Indications:          Stent removal Providers:            Lucilla Lame MD, MD Referring MD:         Shaune Pascal. Bensimhom, MD (Referring MD) Medicines:            Propofol per Anesthesia Complications:        No immediate complications. Procedure:            Pre-Anesthesia Assessment:                       - Prior to the procedure, a History and Physical was                        performed, and patient medications and allergies were                        reviewed. The patient's tolerance of previous                        anesthesia was also reviewed. The risks and benefits of                        the procedure and the sedation options and risks were                        discussed with the patient. All questions were                        answered, and informed consent was obtained. Prior                        Anticoagulants: The patient has taken no previous                        anticoagulant or antiplatelet agents. ASA Grade                        Assessment: II - A patient with mild systemic disease.                        After reviewing the risks and benefits, the patient was                        deemed in satisfactory condition to undergo the                        procedure.                       After obtaining informed consent, the scope was passed                        under direct vision. Throughout the procedure, the  patient's blood pressure, pulse, and oxygen saturations                        were monitored continuously. The Endosonoscope was                        introduced through the mouth, and used to inject                        contrast  into and used to inject contrast into the bile                        duct. The ERCP was accomplished without difficulty. The                        patient tolerated the procedure well. Findings:      A biliary stent was visible on the scout film. One stent was removed       from the biliary tree using a snare. The bile duct was deeply cannulated       with the short-nosed traction sphincterotome. Contrast was injected. I       personally interpreted the bile duct images. There was brisk flow of       contrast through the ducts. Image quality was excellent. Contrast       extended to the entire biliary tree. The main bile duct was normal. The       biliary tree was swept with a 15 mm balloon starting at the bifurcation.       Sludge was swept from the duct. Impression:           - One stent was removed from the biliary tree.                       - The biliary tree was swept and sludge was found. Recommendation:       - Watch for pancreatitis, bleeding, perforation, and                        cholangitis. Procedure Code(s):    --- Professional ---                       [phone], Endoscopic retrograde cholangiopancreatography                        (ERCP); with removal of foreign body(s) or stent(s)                        from biliary/pancreatic duct(s)                       [id number], Endoscopic retrograde cholangiopancreatography                        (ERCP); with removal of calculi/debris from                        biliary/pancreatic duct(s)                       [id number], Endoscopic catheterization of the biliary ductal  system, radiological supervision and interpretation Diagnosis Code(s):    --- Professional ---                       Z46.59, Encounter for fitting and adjustment of other                        gastrointestinal appliance and device CPT copyright 2016 [employer]. All rights reserved. The codes documented in this report are preliminary  and upon coder review may  be revised to meet current compliance requirements. Lucilla Lame MD, MD 01/14/2017 10:26:33 AM This report has been signed electronically. Number of Addenda: 0 Note Initiated On: 01/14/2017 9:51 AM      [facility]

## 2017-01-14 NOTE — Anesthesia Procedure Notes (Signed)
Date/Time: 01/14/2017 10:02 AM Performed by: Hedda Slade Pre-anesthesia Checklist: Patient identified, Emergency Drugs available, Suction available and Patient being monitored Oxygen Delivery Method: Nasal cannula

## 2017-01-14 NOTE — Anesthesia Preprocedure Evaluation (Signed)
Anesthesia Evaluation  Patient identified by MRN, date of birth, ID band Patient awake    Reviewed: Allergy & Precautions, H&P , NPO status , Patient's Chart, lab work & pertinent test results, reviewed documented beta blocker date and time   Airway Mallampati: II   Neck ROM: full    Dental  (+) Teeth Intact   Pulmonary neg pulmonary ROS,    Pulmonary exam normal        Cardiovascular negative cardio ROS Normal cardiovascular exam Rhythm:regular Rate:Normal     Neuro/Psych PSYCHIATRIC DISORDERS Anxiety Depression  Neuromuscular disease negative neurological ROS  negative psych ROS   GI/Hepatic negative GI ROS, Neg liver ROS,   Endo/Other  negative endocrine ROS  Renal/GU negative Renal ROS  negative genitourinary   Musculoskeletal   Abdominal   Peds  Hematology negative hematology ROS (+) anemia ,   Anesthesia Other Findings Past Medical History: No date: Anemia No date: Anxiety No date: Depression Past Surgical History: 04/26/2015: CHOLECYSTECTOMY N/A     Comment: Procedure: LAPAROSCOPIC CHOLECYSTECTOMY WITH               INTRAOPERATIVE CHOLANGIOGRAM;  Surgeon: Molly Maduro, MD;  Location: [facility] ORS;  Service:               General;  Laterality: N/A; 02/26/2016: COLONOSCOPY WITH PROPOFOL N/A     Comment: Procedure: COLONOSCOPY WITH PROPOFOL;                Surgeon: Lollie Sails, MD;  Location: [facility]              ENDOSCOPY;  Service: Endoscopy;  Laterality:               N/A; 11/15/2016: ERCP N/A     Comment: Procedure: ENDOSCOPIC RETROGRADE               CHOLANGIOPANCREATOGRAPHY (ERCP);  Surgeon:               Lucilla Lame, MD;  Location: [facility] ENDOSCOPY;                Service: Endoscopy;  Laterality: N/A;                Inpatient RM 218 No date: LAPAROSCOPIC GASTRIC SLEEVE RESECTION BMI    Body Mass Index:  27.44 kg/m     Reproductive/Obstetrics negative OB ROS                              Anesthesia Physical Anesthesia Plan  ASA: II  Anesthesia Plan: General   Post-op Pain Management:    Induction:   Airway Management Planned:   Additional Equipment:   Intra-op Plan:   Post-operative Plan:   Informed Consent: I have reviewed the patients History and Physical, chart, labs and discussed the procedure including the risks, benefits and alternatives for the proposed anesthesia with the patient or authorized representative who has indicated his/her understanding and acceptance.   Dental Advisory Given  Plan Discussed with: CRNA  Anesthesia Plan Comments:         Anesthesia Quick Evaluation

## 2017-01-14 NOTE — Anesthesia Post-op Follow-up Note (Cosign Needed)
Anesthesia QCDR form completed.        

## 2017-01-14 NOTE — Transfer of Care (Signed)
Immediate Anesthesia Transfer of Care Note  Patient: Bethany Flores  Procedure(s) Performed: Procedure(s): ENDOSCOPIC RETROGRADE CHOLANGIOPANCREATOGRAPHY (ERCP) (N/A)  Patient Location: PACU  Anesthesia Type:General  Level of Consciousness: awake, alert  and oriented  Airway & Oxygen Therapy: Patient Spontanous Breathing and Patient connected to nasal cannula oxygen  Post-op Assessment: Report given to RN and Post -op Vital signs reviewed and stable  Post vital signs: Reviewed and stable  Last Vitals:  Vitals:   01/14/17 0918 01/14/17 1031  BP: 114/67 98/69  Pulse: 63 71  Resp: 16 17  Temp: 36.7 C (!) 36.1 C    Last Pain:  Vitals:   01/14/17 1031  TempSrc: Tympanic         Complications: No apparent anesthesia complications

## 2017-01-14 NOTE — H&P (Signed)
Lucilla Lame, MD [facility] [address] [place], [place] [address] Phone: [phone] Fax : [phone]  Primary Care Physician:  Bing Quarry, MD Primary Gastroenterologist:  Dr. Allen Norris  Pre-Procedure History & Physical: HPI:  Bethany Flores is a 51 y.o. female is here for an ERCP.   Past Medical History:  Diagnosis Date  . Anemia   . Anxiety   . Depression     Past Surgical History:  Procedure Laterality Date  . CHOLECYSTECTOMY N/A 04/26/2015   Procedure: LAPAROSCOPIC CHOLECYSTECTOMY WITH INTRAOPERATIVE CHOLANGIOGRAM;  Surgeon: Molly Maduro, MD;  Location: [facility] ORS;  Service: General;  Laterality: N/A;  . COLONOSCOPY WITH PROPOFOL N/A 02/26/2016   Procedure: COLONOSCOPY WITH PROPOFOL;  Surgeon: Lollie Sails, MD;  Location: [facility] ENDOSCOPY;  Service: Endoscopy;  Laterality: N/A;  . ERCP N/A 11/15/2016   Procedure: ENDOSCOPIC RETROGRADE CHOLANGIOPANCREATOGRAPHY (ERCP);  Surgeon: Lucilla Lame, MD;  Location: [facility] ENDOSCOPY;  Service: Endoscopy;  Laterality: N/A;  Inpatient RM 218  . LAPAROSCOPIC GASTRIC SLEEVE RESECTION      Prior to Admission medications   Medication Sig Start Date End Date Taking? Authorizing Provider  acetaminophen (TYLENOL) 325 MG tablet Take 650 mg by mouth every 6 (six) hours as needed for moderate pain.   Yes Historical Provider, MD  Calcium Carbonate-Vitamin D 600-200 MG-UNIT TABS Take 1 tablet by mouth daily.   Yes Historical Provider, MD  ibuprofen (ADVIL,MOTRIN) 200 MG tablet Take 400 mg by mouth every 6 (six) hours as needed for moderate pain.    Yes Historical Provider, MD  Multiple Vitamins-Minerals (MULTIVITAMIN ADULT) TABS Take 1 tablet by mouth daily.   Yes Historical Provider, MD  sertraline (ZOLOFT) 50 MG tablet Take 50 mg by mouth daily.   Yes Historical Provider, MD    Allergies as of 11/21/2016  . (No Known Allergies)    Family History  Problem Relation Age of Onset  . Diabetes Paternal Grandmother   . Diabetes  Paternal Grandfather   . Breast cancer Neg Hx     Social History   Social History  . Marital status: Married    Spouse name: N/A  . Number of children: N/A  . Years of education: N/A   Occupational History  . Not on file.   Social History Main Topics  . Smoking status: Never Smoker  . Smokeless tobacco: Never Used  . Alcohol use 0.0 oz/week     Comment: occasionally  . Drug use: No  . Sexual activity: Not on file   Other Topics Concern  . Not on file   Social History Narrative  . No narrative on file    Review of Systems: See HPI, otherwise negative ROS  Physical Exam: BP 114/67   Pulse 63   Temp 98 F (36.7 C) (Tympanic)   Resp 16   Ht 5\' 6"  (1.676 m)   Wt 170 lb (77.1 kg)   LMP 12/15/2016 Comment: Urine Preg. Negative  SpO2 100%   BMI 27.44 kg/m  General:   Alert,  pleasant and cooperative in NAD Head:  Normocephalic and atraumatic. Neck:  Supple; no masses or thyromegaly. Lungs:  Clear throughout to auscultation.    Heart:  Regular rate and rhythm. Abdomen:  Soft, nontender and nondistended. Normal bowel sounds, without guarding, and without rebound.   Neurologic:  Alert and  oriented x4;  grossly normal neurologically.  Impression/Plan: Bethany Flores is here for an ERCP to be performed for stent removal.  Risks, benefits, limitations, and alternatives regarding  ERCP  have been reviewed with the patient.  Questions have been answered.  All parties agreeable.   Lucilla Lame, MD  01/14/2017, 9:50 AM

## 2017-01-15 ENCOUNTER — Encounter: Payer: Self-pay | Admitting: Gastroenterology

## 2017-01-15 NOTE — Anesthesia Postprocedure Evaluation (Signed)
Anesthesia Post Note  Patient: Bethany Flores  Procedure(s) Performed: Procedure(s) (LRB): ENDOSCOPIC RETROGRADE CHOLANGIOPANCREATOGRAPHY (ERCP) (N/A)  Patient location during evaluation: PACU Anesthesia Type: General Level of consciousness: awake and alert Pain management: pain level controlled Vital Signs Assessment: post-procedure vital signs reviewed and stable Respiratory status: spontaneous breathing, nonlabored ventilation, respiratory function stable and patient connected to nasal cannula oxygen Cardiovascular status: blood pressure returned to baseline and stable Postop Assessment: no signs of nausea or vomiting Anesthetic complications: no     Last Vitals:  Vitals:   01/14/17 1040 01/14/17 1050  BP: 112/80 104/86  Pulse: 71 72  Resp: 16 16  Temp:      Last Pain:  Vitals:   01/14/17 1031  TempSrc: Tympanic                 Molli Barrows

## 2017-05-15 ENCOUNTER — Telehealth: Payer: Self-pay | Admitting: *Deleted

## 2017-05-15 ENCOUNTER — Inpatient Hospital Stay: Payer: BLUE CROSS/BLUE SHIELD | Attending: Oncology

## 2017-05-15 DIAGNOSIS — D509 Iron deficiency anemia, unspecified: Secondary | ICD-10-CM

## 2017-05-15 DIAGNOSIS — R5383 Other fatigue: Secondary | ICD-10-CM

## 2017-05-15 LAB — CBC WITH DIFFERENTIAL/PLATELET
Basophils Absolute: 0 10*3/uL (ref 0–0.1)
Basophils Relative: 1 %
EOS PCT: 1 %
Eosinophils Absolute: 0.1 10*3/uL (ref 0–0.7)
HEMATOCRIT: 31.4 % — AB (ref 35.0–47.0)
Hemoglobin: 10.6 g/dL — ABNORMAL LOW (ref 12.0–16.0)
LYMPHS ABS: 2.3 10*3/uL (ref 1.0–3.6)
LYMPHS PCT: 40 %
MCH: 26.6 pg (ref 26.0–34.0)
MCHC: 33.9 g/dL (ref 32.0–36.0)
MCV: 78.5 fL — AB (ref 80.0–100.0)
MONOS PCT: 7 %
Monocytes Absolute: 0.4 10*3/uL (ref 0.2–0.9)
NEUTROS ABS: 2.9 10*3/uL (ref 1.4–6.5)
Neutrophils Relative %: 51 %
PLATELETS: 321 10*3/uL (ref 150–440)
RBC: 4 MIL/uL (ref 3.80–5.20)
RDW: 14.3 % (ref 11.5–14.5)
WBC: 5.8 10*3/uL (ref 3.6–11.0)

## 2017-05-15 LAB — IRON AND TIBC
Iron: 17 ug/dL — ABNORMAL LOW (ref 28–170)
Saturation Ratios: 4 % — ABNORMAL LOW (ref 10.4–31.8)
TIBC: 422 ug/dL (ref 250–450)
UIBC: 405 ug/dL

## 2017-05-15 LAB — FERRITIN: Ferritin: 5 ng/mL — ABNORMAL LOW (ref 11–307)

## 2017-05-15 NOTE — Telephone Encounter (Signed)
-----   Message from Lloyd Huger, MD sent at 05/15/2017 12:06 PM EDT ----- Contact: [phone] She can come in for a lab check and then we will call her with results and what to do.  ----- Message ----- From: Elouise Munroe Sent: 05/15/2017  11:17 AM To: Lloyd Huger, MD, Wallene Dales, #  PT L/M that she made need to see Dr Woodfin Ganja and have labs done...was seen in the Fall '17

## 2017-05-15 NOTE — Telephone Encounter (Signed)
Patient coming in for lab only today at 145

## 2017-05-23 NOTE — Progress Notes (Signed)
[place]  [phone] [phone]  ID: Bethany Flores OB: 1966-06-06  MR#: [id number]  CWC#:[id number]  Patient Care Team: Bing Quarry, MD as PCP - General (Family Medicine)  CHIEF COMPLAINT: Iron deficiency anemia.  INTERVAL HISTORY: Patient returns to clinic today as an add-on after complaining of worsening weakness and fatigue. Repeat laboratory work revealed a declining hemoglobin and decreased iron stores. She otherwise feels well and is asymptomatic. She has no neurologic complaints. She denies any recent fevers. She has a good appetite and denies weight loss. She has no chest pain or shortness of breath. She denies any nausea, vomiting, constipation, or diarrhea. She has no urinary complaints. Patient offers no further specific complaints today.  REVIEW OF SYSTEMS:   Review of Systems  Constitutional: Positive for malaise/fatigue. Negative for fever and weight loss.  Respiratory: Negative.  Negative for shortness of breath.   Cardiovascular: Negative.  Negative for chest pain and leg swelling.  Gastrointestinal: Negative.  Negative for abdominal pain, blood in stool and melena.  Musculoskeletal: Negative.   Skin: Negative.  Negative for rash.  Neurological: Positive for weakness.  Psychiatric/Behavioral: Negative.  The patient is not nervous/anxious.     As per HPI. Otherwise, a complete review of systems is negative.  PAST MEDICAL HISTORY: Past Medical History:  Diagnosis Date  . Anemia   . Anxiety   . Depression     PAST SURGICAL HISTORY: Past Surgical History:  Procedure Laterality Date  . CHOLECYSTECTOMY N/A 04/26/2015   Procedure: LAPAROSCOPIC CHOLECYSTECTOMY WITH INTRAOPERATIVE CHOLANGIOGRAM;  Surgeon: Molly Maduro, MD;  Location: [facility] ORS;  Service: General;  Laterality: N/A;  . COLONOSCOPY WITH PROPOFOL N/A 02/26/2016   Procedure: COLONOSCOPY WITH PROPOFOL;  Surgeon: Lollie Sails, MD;  Location: [facility]  ENDOSCOPY;  Service: Endoscopy;  Laterality: N/A;  . ERCP N/A 11/15/2016   Procedure: ENDOSCOPIC RETROGRADE CHOLANGIOPANCREATOGRAPHY (ERCP);  Surgeon: Lucilla Lame, MD;  Location: [facility] ENDOSCOPY;  Service: Endoscopy;  Laterality: N/A;  Inpatient RM 218  . ERCP N/A 01/14/2017   Procedure: ENDOSCOPIC RETROGRADE CHOLANGIOPANCREATOGRAPHY (ERCP);  Surgeon: Lucilla Lame, MD;  Location: [facility] ENDOSCOPY;  Service: Endoscopy;  Laterality: N/A;  . LAPAROSCOPIC GASTRIC SLEEVE RESECTION      FAMILY HISTORY Family History  Problem Relation Age of Onset  . Diabetes Paternal Grandmother   . Diabetes Paternal Grandfather   . Breast cancer Neg Hx        ADVANCED DIRECTIVES:    HEALTH MAINTENANCE: Social History  Substance Use Topics  . Smoking status: Never Smoker  . Smokeless tobacco: Never Used  . Alcohol use 0.0 oz/week     Comment: occasionally     Colonoscopy:  PAP:  Bone density:  Lipid panel:  No Known Allergies  Current Outpatient Prescriptions  Medication Sig Dispense Refill  . acetaminophen (TYLENOL) 325 MG tablet Take 650 mg by mouth every 6 (six) hours as needed for moderate pain.    . Calcium Carbonate-Vitamin D 600-200 MG-UNIT TABS Take 1 tablet by mouth daily.    Marland Kitchen ibuprofen (ADVIL,MOTRIN) 200 MG tablet Take 400 mg by mouth every 6 (six) hours as needed for moderate pain.     . Multiple Vitamins-Minerals (MULTIVITAMIN ADULT) TABS Take 1 tablet by mouth daily.    . sertraline (ZOLOFT) 50 MG tablet Take 50 mg by mouth daily.     No current facility-administered medications for this visit.     OBJECTIVE: Vitals:   05/26/17 1425  BP: 131/81  Pulse: 86  Resp: 20  Temp: 98.4 F (36.9 C)     Body mass index is 28.44 kg/m.    ECOG FS:0 - Asymptomatic  General: Well-developed, well-nourished, no acute distress. Eyes: Pink conjunctiva, anicteric sclera. Lungs: Clear to auscultation bilaterally. Heart: Regular rate and rhythm. No rubs, murmurs, or gallops. Abdomen: Soft,  nontender, nondistended. No organomegaly noted, normoactive bowel sounds. Musculoskeletal: No edema, cyanosis, or clubbing. Neuro: Alert, answering all questions appropriately. Cranial nerves grossly intact. Skin: No rashes or petechiae noted. Psych: Normal affect.   LAB RESULTS:  Lab Results  Component Value Date   NA 135 11/14/2016   K 3.8 11/14/2016   CL 104 11/14/2016   CO2 24 11/14/2016   GLUCOSE 66 11/14/2016   BUN 8 11/14/2016   CREATININE 0.69 11/14/2016   CALCIUM 8.4 (L) 11/14/2016   PROT 6.9 11/16/2016   ALBUMIN 3.3 (L) 11/16/2016   AST 115 (H) 11/16/2016   ALT 164 (H) 11/16/2016   ALKPHOS 301 (H) 11/16/2016   BILITOT 3.2 (H) 11/16/2016   GFRNONAA >60 11/14/2016   GFRAA >60 11/14/2016    Lab Results  Component Value Date   WBC 5.8 05/15/2017   NEUTROABS 2.9 05/15/2017   HGB 10.6 (L) 05/15/2017   HCT 31.4 (L) 05/15/2017   MCV 78.5 (L) 05/15/2017   PLT 321 05/15/2017   Lab Results  Component Value Date   IRON 17 (L) 05/15/2017   TIBC 422 05/15/2017   IRONPCTSAT 4 (L) 05/15/2017   Lab Results  Component Value Date   FERRITIN 5 (L) 05/15/2017      STUDIES: No results found.  ASSESSMENT: Iron deficiency anemia.  PLAN:    1. Iron deficiency anemia:  Patient's hemoglobin and iron stores have trended down and she is symptomatic. Previously, the remainder of her laboratory work was either negative or within normal limits. Proceed with 510 mg IV Feraheme today. Return to clinic in 1 week for a second infusion. Patient will then return to clinic in 3 months with repeat laboratory work and further evaluation. 2. Positive ANA: Possibly a false positive. Patient states she has been seen and evaluated by rheumatology.  Approximately 30 minutes spent in discussion of which greater than 50% was consultation.  Patient expressed understanding and was in agreement with this plan. She also understands that She can call clinic at any time with any questions,  concerns, or complaints.    Lloyd Huger, MD   05/26/2017 3:07 PM

## 2017-05-26 ENCOUNTER — Inpatient Hospital Stay: Payer: BLUE CROSS/BLUE SHIELD

## 2017-05-26 ENCOUNTER — Inpatient Hospital Stay: Payer: BLUE CROSS/BLUE SHIELD | Attending: Oncology | Admitting: Oncology

## 2017-05-26 VITALS — BP 131/81 | HR 86 | Temp 98.4°F | Resp 20 | Wt 176.2 lb

## 2017-05-26 VITALS — BP 110/68

## 2017-05-26 DIAGNOSIS — Z79899 Other long term (current) drug therapy: Secondary | ICD-10-CM | POA: Diagnosis not present

## 2017-05-26 DIAGNOSIS — R7989 Other specified abnormal findings of blood chemistry: Secondary | ICD-10-CM | POA: Diagnosis not present

## 2017-05-26 DIAGNOSIS — D509 Iron deficiency anemia, unspecified: Secondary | ICD-10-CM

## 2017-05-26 DIAGNOSIS — R5383 Other fatigue: Secondary | ICD-10-CM | POA: Diagnosis not present

## 2017-05-26 DIAGNOSIS — F329 Major depressive disorder, single episode, unspecified: Secondary | ICD-10-CM | POA: Diagnosis not present

## 2017-05-26 DIAGNOSIS — R531 Weakness: Secondary | ICD-10-CM

## 2017-05-26 DIAGNOSIS — F419 Anxiety disorder, unspecified: Secondary | ICD-10-CM | POA: Diagnosis not present

## 2017-05-26 MED ORDER — SODIUM CHLORIDE 0.9 % IV SOLN
510.0000 mg | Freq: Once | INTRAVENOUS | Status: AC
  Administered 2017-05-26: 510 mg via INTRAVENOUS
  Filled 2017-05-26: qty 17

## 2017-05-26 MED ORDER — SODIUM CHLORIDE 0.9 % IV SOLN
Freq: Once | INTRAVENOUS | Status: AC
  Administered 2017-05-26: 15:00:00 via INTRAVENOUS
  Filled 2017-05-26: qty 1000

## 2017-05-26 NOTE — Progress Notes (Signed)
Patient reports she was recently seen by rheumatology for positive ANA.

## 2017-06-03 ENCOUNTER — Inpatient Hospital Stay: Payer: BLUE CROSS/BLUE SHIELD

## 2017-06-03 VITALS — BP 108/74 | HR 82 | Temp 98.1°F | Resp 16

## 2017-06-03 DIAGNOSIS — D509 Iron deficiency anemia, unspecified: Secondary | ICD-10-CM | POA: Diagnosis not present

## 2017-06-03 MED ORDER — SODIUM CHLORIDE 0.9 % IV SOLN
Freq: Once | INTRAVENOUS | Status: AC
  Administered 2017-06-03: 14:00:00 via INTRAVENOUS
  Filled 2017-06-03: qty 1000

## 2017-06-03 MED ORDER — FERUMOXYTOL INJECTION 510 MG/17 ML
510.0000 mg | Freq: Once | INTRAVENOUS | Status: AC
  Administered 2017-06-03: 510 mg via INTRAVENOUS
  Filled 2017-06-03: qty 17

## 2017-08-31 NOTE — Progress Notes (Signed)
[facility]  [phone] Fax:(336) [phone]  ID: Bethany Flores OB: 04/09/1966  MR#: [id number]  OMV#:[id number]  Patient Care Team: Bing Quarry, MD as PCP - General (Family Medicine)  CHIEF COMPLAINT: Iron deficiency anemia.  INTERVAL HISTORY: Patient returns to clinic today for repeat laboratory work and further evaluation. She feels significantly improved and back to her baseline since receiving IV Feraheme 3 months ago. She currently feels well and is asymptomatic. She has no neurologic complaints. She denies any recent fevers. She has a good appetite and denies weight loss. She has no chest pain or shortness of breath. She denies any nausea, vomiting, constipation, or diarrhea. She has no urinary complaints. Patient offers no specific complaints today.  REVIEW OF SYSTEMS:   Review of Systems  Constitutional: Negative.  Negative for fever, malaise/fatigue and weight loss.  Respiratory: Negative.  Negative for shortness of breath.   Cardiovascular: Negative.  Negative for chest pain and leg swelling.  Gastrointestinal: Negative.  Negative for abdominal pain, blood in stool and melena.  Genitourinary: Negative.  Negative for hematuria.  Musculoskeletal: Negative.   Skin: Negative.  Negative for rash.  Neurological: Negative for weakness.  Psychiatric/Behavioral: Negative.  The patient is not nervous/anxious.     As per HPI. Otherwise, a complete review of systems is negative.  PAST MEDICAL HISTORY: Past Medical History:  Diagnosis Date  . Anemia   . Anxiety   . Depression     PAST SURGICAL HISTORY: Past Surgical History:  Procedure Laterality Date  . CHOLECYSTECTOMY N/A 04/26/2015   Procedure: LAPAROSCOPIC CHOLECYSTECTOMY WITH INTRAOPERATIVE CHOLANGIOGRAM;  Surgeon: Molly Maduro, MD;  Location: [facility] ORS;  Service: General;  Laterality: N/A;  . COLONOSCOPY WITH PROPOFOL N/A 02/26/2016   Procedure: COLONOSCOPY WITH PROPOFOL;  Surgeon:  Lollie Sails, MD;  Location: [facility] ENDOSCOPY;  Service: Endoscopy;  Laterality: N/A;  . ERCP N/A 11/15/2016   Procedure: ENDOSCOPIC RETROGRADE CHOLANGIOPANCREATOGRAPHY (ERCP);  Surgeon: Lucilla Lame, MD;  Location: [facility] ENDOSCOPY;  Service: Endoscopy;  Laterality: N/A;  Inpatient RM [id number]  . ERCP N/A 01/14/2017   Procedure: ENDOSCOPIC RETROGRADE CHOLANGIOPANCREATOGRAPHY (ERCP);  Surgeon: Lucilla Lame, MD;  Location: [facility] ENDOSCOPY;  Service: Endoscopy;  Laterality: N/A;  . LAPAROSCOPIC GASTRIC SLEEVE RESECTION      FAMILY HISTORY Family History  Problem Relation Age of Onset  . Diabetes Paternal Grandmother   . Diabetes Paternal Grandfather   . Breast cancer Neg Hx        ADVANCED DIRECTIVES:    HEALTH MAINTENANCE: Social History  Substance Use Topics  . Smoking status: Never Smoker  . Smokeless tobacco: Never Used  . Alcohol use 0.0 oz/week     Comment: occasionally     Colonoscopy:  PAP:  Bone density:  Lipid panel:  No Known Allergies  Current Outpatient Prescriptions  Medication Sig Dispense Refill  . acetaminophen (TYLENOL) 325 MG tablet Take 650 mg by mouth every 6 (six) hours as needed for moderate pain.    . Calcium Carbonate-Vitamin D 600-200 MG-UNIT TABS Take 1 tablet by mouth daily.    Marland Kitchen ibuprofen (ADVIL,MOTRIN) 200 MG tablet Take 400 mg by mouth every 6 (six) hours as needed for moderate pain.     . Multiple Vitamins-Minerals (MULTIVITAMIN ADULT) TABS Take 1 tablet by mouth daily.    . sertraline (ZOLOFT) 50 MG tablet Take 50 mg by mouth daily.     No current facility-administered medications for this visit.     OBJECTIVE: Vitals:   09/02/17 1417  BP:  110/73  Pulse: 69  Resp: 18  Temp: 98.6 F (37 C)     Body mass index is 29.46 kg/m.    ECOG FS:0 - Asymptomatic  General: Well-developed, well-nourished, no acute distress. Eyes: Pink conjunctiva, anicteric sclera. Lungs: Clear to auscultation bilaterally. Heart: Regular rate and rhythm. No rubs,  murmurs, or gallops. Abdomen: Soft, nontender, nondistended. No organomegaly noted, normoactive bowel sounds. Musculoskeletal: No edema, cyanosis, or clubbing. Neuro: Alert, answering all questions appropriately. Cranial nerves grossly intact. Skin: No rashes or petechiae noted. Psych: Normal affect.   LAB RESULTS:  Lab Results  Component Value Date   NA 135 11/14/2016   K 3.8 11/14/2016   CL 104 11/14/2016   CO2 24 11/14/2016   GLUCOSE 66 11/14/2016   BUN 8 11/14/2016   CREATININE 0.69 11/14/2016   CALCIUM 8.4 (L) 11/14/2016   PROT 6.9 11/16/2016   ALBUMIN 3.3 (L) 11/16/2016   AST 115 (H) 11/16/2016   ALT 164 (H) 11/16/2016   ALKPHOS 301 (H) 11/16/2016   BILITOT 3.2 (H) 11/16/2016   GFRNONAA >60 11/14/2016   GFRAA >60 11/14/2016    Lab Results  Component Value Date   WBC 5.9 09/02/2017   NEUTROABS 3.8 09/02/2017   HGB 13.0 09/02/2017   HCT 37.9 09/02/2017   MCV 89.8 09/02/2017   PLT 275 09/02/2017   Lab Results  Component Value Date   IRON 17 (L) 05/15/2017   TIBC 422 05/15/2017   IRONPCTSAT 4 (L) 05/15/2017   Lab Results  Component Value Date   FERRITIN 5 (L) 05/15/2017      STUDIES: No results found.  ASSESSMENT: Iron deficiency anemia.  PLAN:    1. Iron deficiency anemia:  Patient's hemoglobin has significantly improved and is now within normal limits. Iron stores are pending at time of dictation. Patient is no longer symptomatic. Previously, the remainder of her laboratory work was either negative or within normal limits. She does not require additional IV Feraheme today. Patient last received Feraheme on June 03, 2017. Return to clinic in 6 months with repeat laboratory work and further evaluation. 2. Positive ANA: Likely clinically insignificant, repeat testing from today is pending. Patient states she has been seen and evaluated by rheumatology.  Approximately 20 minutes spent in discussion of which greater than 50% was consultation.  Patient  expressed understanding and was in agreement with this plan. She also understands that She can call clinic at any time with any questions, concerns, or complaints.    Lloyd Huger, MD   09/02/2017 2:31 PM

## 2017-09-02 ENCOUNTER — Inpatient Hospital Stay: Payer: BLUE CROSS/BLUE SHIELD | Attending: Oncology

## 2017-09-02 ENCOUNTER — Inpatient Hospital Stay (HOSPITAL_BASED_OUTPATIENT_CLINIC_OR_DEPARTMENT_OTHER): Payer: BLUE CROSS/BLUE SHIELD | Admitting: Oncology

## 2017-09-02 ENCOUNTER — Inpatient Hospital Stay: Payer: BLUE CROSS/BLUE SHIELD

## 2017-09-02 VITALS — BP 110/73 | HR 69 | Temp 98.6°F | Resp 18 | Wt 182.5 lb

## 2017-09-02 DIAGNOSIS — Z9049 Acquired absence of other specified parts of digestive tract: Secondary | ICD-10-CM

## 2017-09-02 DIAGNOSIS — R7989 Other specified abnormal findings of blood chemistry: Secondary | ICD-10-CM | POA: Insufficient documentation

## 2017-09-02 DIAGNOSIS — F329 Major depressive disorder, single episode, unspecified: Secondary | ICD-10-CM

## 2017-09-02 DIAGNOSIS — Z79899 Other long term (current) drug therapy: Secondary | ICD-10-CM | POA: Insufficient documentation

## 2017-09-02 DIAGNOSIS — D509 Iron deficiency anemia, unspecified: Secondary | ICD-10-CM | POA: Diagnosis present

## 2017-09-02 DIAGNOSIS — F419 Anxiety disorder, unspecified: Secondary | ICD-10-CM | POA: Diagnosis not present

## 2017-09-02 DIAGNOSIS — R768 Other specified abnormal immunological findings in serum: Secondary | ICD-10-CM

## 2017-09-02 LAB — CBC WITH DIFFERENTIAL/PLATELET
Basophils Absolute: 0.1 10*3/uL (ref 0–0.1)
Basophils Relative: 1 %
EOS ABS: 0 10*3/uL (ref 0–0.7)
EOS PCT: 1 %
HCT: 37.9 % (ref 35.0–47.0)
HEMOGLOBIN: 13 g/dL (ref 12.0–16.0)
LYMPHS ABS: 1.7 10*3/uL (ref 1.0–3.6)
LYMPHS PCT: 30 %
MCH: 30.8 pg (ref 26.0–34.0)
MCHC: 34.3 g/dL (ref 32.0–36.0)
MCV: 89.8 fL (ref 80.0–100.0)
MONOS PCT: 5 %
Monocytes Absolute: 0.3 10*3/uL (ref 0.2–0.9)
Neutro Abs: 3.8 10*3/uL (ref 1.4–6.5)
Neutrophils Relative %: 63 %
Platelets: 275 10*3/uL (ref 150–440)
RBC: 4.22 MIL/uL (ref 3.80–5.20)
RDW: 12.7 % (ref 11.5–14.5)
WBC: 5.9 10*3/uL (ref 3.6–11.0)

## 2017-09-02 LAB — IRON AND TIBC
IRON: 115 ug/dL (ref 28–170)
Saturation Ratios: 31 % (ref 10.4–31.8)
TIBC: 373 ug/dL (ref 250–450)
UIBC: 258 ug/dL

## 2017-09-02 LAB — FERRITIN: FERRITIN: 19 ng/mL (ref 11–307)

## 2017-09-02 NOTE — Progress Notes (Signed)
Pt in for follow up.  Denies any difficulties at this time.

## 2017-09-03 LAB — ANA W/REFLEX: ANA: NEGATIVE

## 2017-09-08 ENCOUNTER — Encounter: Payer: Self-pay | Admitting: Oncology

## 2017-12-31 ENCOUNTER — Encounter: Payer: Self-pay | Admitting: Oncology

## 2018-01-02 ENCOUNTER — Other Ambulatory Visit: Payer: Self-pay | Admitting: Obstetrics and Gynecology

## 2018-01-02 ENCOUNTER — Telehealth: Payer: Self-pay | Admitting: *Deleted

## 2018-01-02 DIAGNOSIS — Z1231 Encounter for screening mammogram for malignant neoplasm of breast: Secondary | ICD-10-CM

## 2018-01-02 NOTE — Telephone Encounter (Signed)
Brooke, Please reschedule patient appointment to be a week before her 4/1 surgery date

## 2018-01-02 NOTE — Telephone Encounter (Signed)
That's fine

## 2018-01-02 NOTE — Telephone Encounter (Signed)
Patient called reporting that she is having surgery 4/1 and she does not return for iron check until 4/16. She asks if she can come and have her iron levels checked before she has surgery in case she needs somethign done before she has surgery. Please advise

## 2018-01-13 ENCOUNTER — Ambulatory Visit
Admission: RE | Admit: 2018-01-13 | Discharge: 2018-01-13 | Disposition: A | Discharge status: Home / Self Care | Payer: BLUE CROSS/BLUE SHIELD | Source: Ambulatory Visit | Attending: Obstetrics and Gynecology | Admitting: Obstetrics and Gynecology

## 2018-01-13 DIAGNOSIS — Z1231 Encounter for screening mammogram for malignant neoplasm of breast: Secondary | ICD-10-CM | POA: Diagnosis present

## 2018-02-04 ENCOUNTER — Other Ambulatory Visit: Payer: BLUE CROSS/BLUE SHIELD

## 2018-02-08 NOTE — Progress Notes (Signed)
[facility]  [phone] Fax:(336) [phone]  ID: Bethany Flores OB: [birth year]  MR#: [id number]  YKZ#:[id number]  Patient Care Team: Bing Quarry, MD as PCP - General (Family Medicine)  CHIEF COMPLAINT: Iron deficiency anemia.  INTERVAL HISTORY: Patient returns to clinic today for repeat laboratory work and further evaluation. She is scheduled for spinal decompression surgery in several weeks and requested to her appointment be moved up prior to her surgery.  She has mild weakness and fatigue over the past several weeks, but otherwise feels well. She has no neurologic complaints. She denies any recent fevers or illnesses. She has a good appetite and denies weight loss. She has no chest pain or shortness of breath. She denies any nausea, vomiting, constipation, or diarrhea.  She denies any melena or hematochezia.  She has no urinary complaints. Patient offers no further specific complaints today.  REVIEW OF SYSTEMS:   Review of Systems  Constitutional: Negative.  Negative for fever, malaise/fatigue and weight loss.  Respiratory: Negative.  Negative for shortness of breath.   Cardiovascular: Negative.  Negative for chest pain and leg swelling.  Gastrointestinal: Negative.  Negative for abdominal pain, blood in stool and melena.  Genitourinary: Negative.  Negative for hematuria.  Musculoskeletal: Positive for back pain and neck pain.  Skin: Negative.  Negative for rash.  Neurological: Negative.  Negative for sensory change, focal weakness and weakness.  Psychiatric/Behavioral: Negative.  The patient is not nervous/anxious.     As per HPI. Otherwise, a complete review of systems is negative.  PAST MEDICAL HISTORY: Past Medical History:  Diagnosis Date  . Anemia   . Anxiety   . Depression     PAST SURGICAL HISTORY: Past Surgical History:  Procedure Laterality Date  . CHOLECYSTECTOMY N/A 04/26/2015   Procedure: LAPAROSCOPIC CHOLECYSTECTOMY WITH  INTRAOPERATIVE CHOLANGIOGRAM;  Surgeon: Molly Maduro, MD;  Location: [facility] ORS;  Service: General;  Laterality: N/A;  . COLONOSCOPY WITH PROPOFOL N/A 02/26/2016   Procedure: COLONOSCOPY WITH PROPOFOL;  Surgeon: Lollie Sails, MD;  Location: [facility] ENDOSCOPY;  Service: Endoscopy;  Laterality: N/A;  . ERCP N/A 11/15/2016   Procedure: ENDOSCOPIC RETROGRADE CHOLANGIOPANCREATOGRAPHY (ERCP);  Surgeon: Lucilla Lame, MD;  Location: [facility] ENDOSCOPY;  Service: Endoscopy;  Laterality: N/A;  Inpatient RM [id number]  . ERCP N/A 01/14/2017   Procedure: ENDOSCOPIC RETROGRADE CHOLANGIOPANCREATOGRAPHY (ERCP);  Surgeon: Lucilla Lame, MD;  Location: [facility] ENDOSCOPY;  Service: Endoscopy;  Laterality: N/A;  . LAPAROSCOPIC GASTRIC SLEEVE RESECTION      FAMILY HISTORY Family History  Problem Relation Age of Onset  . Diabetes Paternal Grandmother   . Diabetes Paternal Grandfather   . Breast cancer Neg Hx        ADVANCED DIRECTIVES:    HEALTH MAINTENANCE: Social History   Tobacco Use  . Smoking status: Never Smoker  . Smokeless tobacco: Never Used  Substance Use Topics  . Alcohol use: Yes    Alcohol/week: 0.0 oz    Comment: occasionally  . Drug use: No     Colonoscopy:  PAP:  Bone density:  Lipid panel:  No Known Allergies  Current Outpatient Medications  Medication Sig Dispense Refill  . acetaminophen (TYLENOL) 325 MG tablet Take 650 mg by mouth every 6 (six) hours as needed for moderate pain.    . Calcium Carbonate-Vitamin D 600-200 MG-UNIT TABS Take 1 tablet by mouth daily.    Marland Kitchen ibuprofen (ADVIL,MOTRIN) 200 MG tablet Take 400 mg by mouth every 6 (six) hours as needed for moderate pain.     Marland Kitchen  Multiple Vitamins-Minerals (MULTIVITAMIN ADULT) TABS Take 1 tablet by mouth daily.    . sertraline (ZOLOFT) 50 MG tablet Take 50 mg by mouth daily.     No current facility-administered medications for this visit.     OBJECTIVE: Vitals:   02/10/18 1121  BP: 115/79  Pulse: 68  Resp: 20  Temp: (!) 97.3  F (36.3 C)     Body mass index is 30.51 kg/m.    ECOG FS:0 - Asymptomatic  General: Well-developed, well-nourished, no acute distress. Eyes: Pink conjunctiva, anicteric sclera. Lungs: Clear to auscultation bilaterally. Heart: Regular rate and rhythm. No rubs, murmurs, or gallops. Abdomen: Soft, nontender, nondistended. No organomegaly noted, normoactive bowel sounds. Musculoskeletal: No edema, cyanosis, or clubbing. Neuro: Alert, answering all questions appropriately. Cranial nerves grossly intact. Skin: No rashes or petechiae noted. Psych: Normal affect.   LAB RESULTS:  Lab Results  Component Value Date   NA 135 11/14/2016   K 3.8 11/14/2016   CL 104 11/14/2016   CO2 24 11/14/2016   GLUCOSE 66 11/14/2016   BUN 8 11/14/2016   CREATININE 0.69 11/14/2016   CALCIUM 8.4 (L) 11/14/2016   PROT 6.9 11/16/2016   ALBUMIN 3.3 (L) 11/16/2016   AST 115 (H) 11/16/2016   ALT 164 (H) 11/16/2016   ALKPHOS 301 (H) 11/16/2016   BILITOT 3.2 (H) 11/16/2016   GFRNONAA >60 11/14/2016   GFRAA >60 11/14/2016    Lab Results  Component Value Date   WBC 5.5 02/10/2018   NEUTROABS 2.7 02/10/2018   HGB 11.4 (L) 02/10/2018   HCT 34.7 (L) 02/10/2018   MCV 82.6 02/10/2018   PLT 371 02/10/2018   Lab Results  Component Value Date   IRON 35 02/10/2018   TIBC 448 02/10/2018   IRONPCTSAT 8 (L) 02/10/2018   Lab Results  Component Value Date   FERRITIN 6 (L) 02/10/2018      STUDIES: Mm Screening Breast Tomo Bilateral  Result Date: 01/14/2018 CLINICAL DATA:  Screening. EXAM: DIGITAL SCREENING BILATERAL MAMMOGRAM WITH TOMO AND CAD COMPARISON:  Previous exam(s). ACR Breast Density Category b: There are scattered areas of fibroglandular density. FINDINGS: There are no findings suspicious for malignancy. Images were processed with CAD. IMPRESSION: No mammographic evidence of malignancy. A result letter of this screening mammogram will be mailed directly to the patient. RECOMMENDATION: Screening  mammogram in one year. (Code:SM-B-01Y) BI-RADS CATEGORY  1: Negative. Electronically Signed   By: Fidela Salisbury M.D.   On: 01/14/2018 17:24    ASSESSMENT: Iron deficiency anemia.  PLAN:    1. Iron deficiency anemia:  Patient's hemoglobin and iron stores have trended down. Previously, the remainder of her laboratory work was either negative or within normal limits.  Patient appears to only to require IV Feraheme every 6 months.  Proceed with 510 mg IV Feraheme today.  Return to clinic in 6 months for repeat laboratory work and further evaluation.   2. Positive ANA: Repeat testing on September 02, 2017 is reported as negative.  No further intervention is needed. 3.  Spinal surgery: Proceed with surgery as indicated.    Approximately 30 minutes spent in discussion of which greater than 50% was consultation.  Patient expressed understanding and was in agreement with this plan. She also understands that She can call clinic at any time with any questions, concerns, or complaints.    Lloyd Huger, MD   02/11/2018 5:00 PM

## 2018-02-10 ENCOUNTER — Inpatient Hospital Stay: Payer: BLUE CROSS/BLUE SHIELD | Attending: Oncology | Admitting: *Deleted

## 2018-02-10 ENCOUNTER — Inpatient Hospital Stay: Payer: BLUE CROSS/BLUE SHIELD

## 2018-02-10 ENCOUNTER — Inpatient Hospital Stay: Payer: BLUE CROSS/BLUE SHIELD | Admitting: Oncology

## 2018-02-10 VITALS — BP 115/79 | HR 68 | Temp 97.3°F | Resp 20 | Wt 189.0 lb

## 2018-02-10 DIAGNOSIS — F329 Major depressive disorder, single episode, unspecified: Secondary | ICD-10-CM | POA: Insufficient documentation

## 2018-02-10 DIAGNOSIS — F419 Anxiety disorder, unspecified: Secondary | ICD-10-CM | POA: Diagnosis not present

## 2018-02-10 DIAGNOSIS — Z79899 Other long term (current) drug therapy: Secondary | ICD-10-CM

## 2018-02-10 DIAGNOSIS — D509 Iron deficiency anemia, unspecified: Secondary | ICD-10-CM | POA: Diagnosis not present

## 2018-02-10 LAB — IRON AND TIBC
IRON: 35 ug/dL (ref 28–170)
SATURATION RATIOS: 8 % — AB (ref 10.4–31.8)
TIBC: 448 ug/dL (ref 250–450)
UIBC: 413 ug/dL

## 2018-02-10 LAB — CBC WITH DIFFERENTIAL/PLATELET
BASOS ABS: 0 10*3/uL (ref 0–0.1)
Basophils Relative: 1 %
EOS ABS: 0.1 10*3/uL (ref 0–0.7)
EOS PCT: 2 %
HCT: 34.7 % — ABNORMAL LOW (ref 35.0–47.0)
Hemoglobin: 11.4 g/dL — ABNORMAL LOW (ref 12.0–16.0)
Lymphocytes Relative: 42 %
Lymphs Abs: 2.3 10*3/uL (ref 1.0–3.6)
MCH: 27.1 pg (ref 26.0–34.0)
MCHC: 32.9 g/dL (ref 32.0–36.0)
MCV: 82.6 fL (ref 80.0–100.0)
MONO ABS: 0.3 10*3/uL (ref 0.2–0.9)
Monocytes Relative: 6 %
Neutro Abs: 2.7 10*3/uL (ref 1.4–6.5)
Neutrophils Relative %: 49 %
Platelets: 371 10*3/uL (ref 150–440)
RBC: 4.2 MIL/uL (ref 3.80–5.20)
RDW: 13.3 % (ref 11.5–14.5)
WBC: 5.5 10*3/uL (ref 3.6–11.0)

## 2018-02-10 LAB — FERRITIN: FERRITIN: 6 ng/mL — AB (ref 11–307)

## 2018-02-10 MED ORDER — SODIUM CHLORIDE 0.9 % IV SOLN
510.0000 mg | Freq: Once | INTRAVENOUS | Status: AC
  Administered 2018-02-10: 510 mg via INTRAVENOUS
  Filled 2018-02-10: qty 17

## 2018-02-10 MED ORDER — SODIUM CHLORIDE 0.9 % IV SOLN
Freq: Once | INTRAVENOUS | Status: AC
  Administered 2018-02-10: 12:00:00 via INTRAVENOUS
  Filled 2018-02-10: qty 1000

## 2018-02-10 NOTE — Progress Notes (Signed)
Patient here today for follow up regarding anemia. Patient is scheduled for spinal decompression surgery on Monday 4/1 and wanted to have her labs checked prior to surgery.

## 2018-02-16 HISTORY — PX: BACK SURGERY: SHX140

## 2018-03-03 ENCOUNTER — Other Ambulatory Visit: Payer: BLUE CROSS/BLUE SHIELD

## 2018-03-03 ENCOUNTER — Ambulatory Visit: Payer: BLUE CROSS/BLUE SHIELD

## 2018-03-03 ENCOUNTER — Ambulatory Visit: Payer: BLUE CROSS/BLUE SHIELD | Admitting: Oncology

## 2018-04-22 NOTE — H&P (Signed)
Chief Complaint:    Patient ID: Bethany Flores is a 52 y.o. female presenting with Pre Op Consulting (sign consents)  on 04/22/2018  HPI: AUB- 8 cm fibroid. Prior gastric sleeve and prior lap chole  Workup:  Pap: 12/18 neg with neg HPV EMBx: 1/19- benign TVUS:  Ut anteverted Endo=38mm No FF seen in CDS's  Fibroid seen: Lt lat=8.8cm B/L ov's appear wnl  3 echogenic areas seen within endo: 1) 1.07cm 2) 0.56cm 3) 0.82cm  EMBx: ENDOMETRIUM, BIOPSY:  NO HYPERPLASIA OR CARCINOMA. SECRETORY ENDOMETRIUM.   Pap smear: 12/18 neg with neg hpv Past Medical History:  has a past medical history of Anemia, unspecified, Anxiety, Arthritis, DDD (degenerative disc disease), lumbar, Depression (12+Years), Diverticulosis (02/26/2016), H/O carpal tunnel syndrome, History of chicken pox, and Plantar fasciitis.  Past Surgical History:  has a past surgical history that includes Colonoscopy (12/17/10); Cholecystectomy (04/26/2015); Colonoscopy (02/26/2016); Microdiscectomy lumbar; blocked bile duct  (10/2016); bile duct stint removal  (10/2016); gastric sleeve; and laminectomy posterior lumbar facetectomy & foraminotomy w/decomp (Right, 02/16/2018). Family History: family history includes Colon cancer in her mother; Gout in her mother; High blood pressure (Hypertension) in her father, mother, and sister; Hyperlipidemia (Elevated cholesterol) in her mother; Osteoarthritis in her maternal grandmother; Osteoporosis (Thinning of bones) in her father and mother. Social History:  reports that she has never smoked. She has never used smokeless tobacco. She reports that she drinks about 1.2 oz of alcohol per week. She reports that she does not use drugs. OB/GYN History:  OB History    Gravida  1   Para  1   Term  1   Preterm      AB      Living  1     SAB      TAB      Ectopic      Molar      Multiple      Live Births             Allergies: has No Known  Allergies. Medications:  Current Outpatient Medications:  .  acetaminophen (TYLENOL) 500 MG tablet, Take 1,000 mg by mouth every 6 (six) hours as needed for Pain  , Disp: , Rfl:  .  calcium carbonate-vitamin D3 (CALTRATE 600+D) 600 mg(1,500mg ) -200 unit tablet, Take 1 tablet by mouth once daily  , Disp: , Rfl:  .  ibuprofen (ADVIL,MOTRIN) 200 MG tablet, Take 800 mg by mouth every 8 (eight) hours as needed for Pain  , Disp: , Rfl:  .  multivitamin tablet, Take 1 tablet by mouth once daily., Disp: , Rfl:  .  omega-3 acid ethyl esters (LOVAZA) 1 gram capsule, Take 1 g by mouth once daily, Disp: , Rfl:  .  sertraline (ZOLOFT) 50 MG tablet, Take 50 mg by mouth once daily, Disp: , Rfl:  .  gabapentin (NEURONTIN) 100 MG capsule, Take 2 capsules (200 mg total) by mouth nightly (Patient not taking: Reported on 04/22/2018 ), Disp: 60 capsule, Rfl: 0   Review of Systems: No SOB, no palpitations or chest pain, no new lower extremity edema, no nausea or vomiting or bowel or bladder complaints. See HPI for gyn specific ROS.   Exam:     BP 107/70   Pulse 72   Ht 167.6 cm (5\' 6" )   Wt 85.3 kg (188 lb)   BMI 30.34 kg/m   General: Patient is well-groomed, well-nourished, appears stated age in no acute distress  HEENT: head is atraumatic and  normocephalic, trachea is midline, neck is supple with no palpable nodules  CV: Regular rhythm and normal heart rate, no murmur  Pulm: Clear to auscultation throughout lung fields with no wheezing, crackles, or rhonchi. 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    Plan:    Patient returns for a preoperative discussion regarding her plans to proceed with surgical treatment of her AUB- fibroids by total laparoscopic hysterectomy with bilateral salpingectomy procedure.  We will perform a cystoscopy  to evaluate the urinary tract after the procedure.   We will plan for vaginal morcellation within a bag, but may do at the umbilicus if clinically appropriate.  The patient and I discussed the technical aspects of the procedure including the potential for risks and complications.  These include but are not limited to the risk of infection requiring post-operative antibiotics or further procedures.  We talked about the risk of injury to adjacent organs including bladder, bowel, ureter, blood vessels or nerves.  We talked about the need to convert to an open incision.  We talked about the possible need for blood transfusion.  We talked about postop complications such as thromboembolic or cardiopulmonary complications.  All of her questions were answered.  Her preoperative exam was completed and the appropriate consents were signed. She is scheduled to undergo this procedure in the near future.  Specific Peri-operative Considerations:  - Consent: obtained today - Health Maintenance: up to date - Labs: CBC, CMP preoperatively - Studies: EKG, CXR preoperatively - Bowel Preparation: None required - Abx:  Cefoxitin 2g - VTE ppx: SCDs perioperatively - Glucose Protocol: n/a - Beta-blockade: n/a    Return for Postop check.  Sherrie George, MD

## 2018-04-27 ENCOUNTER — Other Ambulatory Visit: Payer: Self-pay

## 2018-04-27 ENCOUNTER — Encounter
Admission: RE | Admit: 2018-04-27 | Discharge: 2018-04-27 | Disposition: A | Discharge status: Home / Self Care | Payer: BLUE CROSS/BLUE SHIELD | Source: Ambulatory Visit | Attending: Obstetrics and Gynecology | Admitting: Obstetrics and Gynecology

## 2018-04-27 HISTORY — DX: Gastro-esophageal reflux disease without esophagitis: K21.9

## 2018-04-27 NOTE — Patient Instructions (Signed)
Your procedure is scheduled on: 05-04-18 MONDAY Report to Same Day Surgery 2nd floor medical mall [facility] Entrance-take elevator on left to 2nd floor.  Check in with surgery information desk.) To find out your arrival time please call [phone] between 1PM - 3PM on 05-01-18 FRIDAY  Remember: Instructions that are not followed completely may result in serious medical risk, up to and including death, or upon the discretion of your surgeon and anesthesiologist your surgery may need to be rescheduled.    _x___ 1. Do not eat food after midnight the night before your procedure. NO GUM OR CANDY AFTER MIDNIGHT.  You may drink clear liquids up to 2 hours before you are scheduled to arrive at the hospital for your procedure.  Do not drink clear liquids within 2 hours of your scheduled arrival to the hospital.  Clear liquids include  --Water or Apple juice without pulp  --Clear carbohydrate beverage such as ClearFast or Gatorade  --Black Coffee or Clear Tea (No milk, no creamers, do not add anything to the coffee or Tea     __x__ 2. No Alcohol for 24 hours before or after surgery.   __x__3. No Smoking or e-cigarettes for 24 prior to surgery.  Do not use any chewable tobacco products for at least 6 hour prior to surgery   ____  4. Bring all medications with you on the day of surgery if instructed.    __x__ 5. Notify your doctor if there is any change in your medical condition     (cold, fever, infections).    x___6. On the morning of surgery brush your teeth with toothpaste and water.  You may rinse your mouth with mouth wash if you wish.  Do not swallow any toothpaste or mouthwash.   Do not wear jewelry, make-up, hairpins, clips or nail polish.  Do not wear lotions, powders, or perfumes. You may wear deodorant.  Do not shave 48 hours prior to surgery. Men may shave face and neck.  Do not bring valuables to the hospital.    [facility] is not responsible for any belongings or  valuables.               Contacts, dentures or bridgework may not be worn into surgery.  Leave your suitcase in the car. After surgery it may be brought to your room.  For patients admitted to the hospital, discharge time is determined by your treatment team.  _  Patients discharged the day of surgery will not be allowed to drive home.  You will need someone to drive you home and stay with you the night of your procedure.    Please read over the following fact sheets that you were given:   INCENTIVE SPIROMETER  _x___ [place] WITH A SMALL SIP OF WATER. These include:  1. ZOLOFT (SERTRALINE)  2.  3.  4.  5.  6.  ____Fleets enema or Magnesium Citrate as directed.   _x___ Use CHG Soap or sage wipes as directed on instruction sheet   ____ Use inhalers on the day of surgery and bring to hospital day of surgery  ____ Stop Metformin and Janumet 2 days prior to surgery.    ____ Take 1/2 of usual insulin dose the night before surgery and none on the morning surgery.   ____ Follow recommendations from Cardiologist, Pulmonologist or PCP regarding stopping Aspirin, Coumadin, Plavix ,Eliquis, Effient, or Pradaxa, and Pletal.  X____Stop Anti-inflammatories such as Advil,  Aleve, Ibuprofen, Motrin, Naproxen, Naprosyn, Goodies powders or aspirin products NOW-OK to take Tylenol    ____ Stop supplements until after surgery.    ____ Bring C-Pap to the hospital.

## 2018-04-29 ENCOUNTER — Encounter
Admission: RE | Admit: 2018-04-29 | Discharge: 2018-04-29 | Disposition: A | Discharge status: Home / Self Care | Payer: BLUE CROSS/BLUE SHIELD | Source: Ambulatory Visit | Attending: Obstetrics and Gynecology | Admitting: Obstetrics and Gynecology

## 2018-04-29 DIAGNOSIS — Z01812 Encounter for preprocedural laboratory examination: Secondary | ICD-10-CM | POA: Insufficient documentation

## 2018-04-29 DIAGNOSIS — Z0183 Encounter for blood typing: Secondary | ICD-10-CM | POA: Insufficient documentation

## 2018-04-29 LAB — CBC
HCT: 34.9 % — ABNORMAL LOW (ref 35.0–47.0)
Hemoglobin: 11.9 g/dL — ABNORMAL LOW (ref 12.0–16.0)
MCH: 29.2 pg (ref 26.0–34.0)
MCHC: 34.2 g/dL (ref 32.0–36.0)
MCV: 85.3 fL (ref 80.0–100.0)
Platelets: 346 K/uL (ref 150–440)
RBC: 4.09 MIL/uL (ref 3.80–5.20)
RDW: 15.1 % — ABNORMAL HIGH (ref 11.5–14.5)
WBC: 4.7 K/uL (ref 3.6–11.0)

## 2018-04-29 LAB — TYPE AND SCREEN
ABO/RH(D): O POS
Antibody Screen: NEGATIVE

## 2018-04-29 LAB — BASIC METABOLIC PANEL
Anion gap: 7 (ref 5–15)
BUN: 13 mg/dL (ref 6–20)
CHLORIDE: 105 mmol/L (ref 101–111)
CO2: 25 mmol/L (ref 22–32)
CREATININE: 0.73 mg/dL (ref 0.44–1.00)
Calcium: 8.5 mg/dL — ABNORMAL LOW (ref 8.9–10.3)
GFR calc non Af Amer: >60 mL/min (ref >60)
Glucose, Bld: 84 mg/dL (ref 65–99)
POTASSIUM: 3.9 mmol/L (ref 3.5–5.1)
Sodium: 137 mmol/L (ref 135–145)

## 2018-04-29 NOTE — Pre-Procedure Instructions (Signed)
This RN reviewed pre-op instructions w/ pt, including CHG and incentive spirometer instructions.  Pt provided with copy of written instructions, CHG soap and incentive spirometer.  Pt verbalized understanding.

## 2018-05-03 MED ORDER — CEFAZOLIN SODIUM-DEXTROSE 2-4 GM/100ML-% IV SOLN
2.0000 g | INTRAVENOUS | Status: AC
  Administered 2018-05-04: 2 g via INTRAVENOUS

## 2018-05-04 ENCOUNTER — Ambulatory Visit: Payer: BLUE CROSS/BLUE SHIELD

## 2018-05-04 ENCOUNTER — Other Ambulatory Visit: Payer: Self-pay

## 2018-05-04 ENCOUNTER — Encounter: Payer: Self-pay | Admitting: *Deleted

## 2018-05-04 ENCOUNTER — Encounter
Admission: RE | Disposition: A | Discharge status: Home / Self Care | Payer: Self-pay | Source: Ambulatory Visit | Attending: Obstetrics and Gynecology

## 2018-05-04 ENCOUNTER — Ambulatory Visit
Admission: RE | Admit: 2018-05-04 | Discharge: 2018-05-04 | Disposition: A | Discharge status: Home / Self Care | Payer: BLUE CROSS/BLUE SHIELD | Source: Ambulatory Visit | Attending: Obstetrics and Gynecology | Admitting: Obstetrics and Gynecology

## 2018-05-04 DIAGNOSIS — D282 Benign neoplasm of uterine tubes and ligaments: Secondary | ICD-10-CM | POA: Diagnosis not present

## 2018-05-04 DIAGNOSIS — N800 Endometriosis of uterus: Secondary | ICD-10-CM | POA: Diagnosis not present

## 2018-05-04 DIAGNOSIS — N920 Excessive and frequent menstruation with regular cycle: Secondary | ICD-10-CM | POA: Diagnosis present

## 2018-05-04 DIAGNOSIS — F419 Anxiety disorder, unspecified: Secondary | ICD-10-CM | POA: Insufficient documentation

## 2018-05-04 DIAGNOSIS — N888 Other specified noninflammatory disorders of cervix uteri: Secondary | ICD-10-CM | POA: Insufficient documentation

## 2018-05-04 DIAGNOSIS — F329 Major depressive disorder, single episode, unspecified: Secondary | ICD-10-CM | POA: Diagnosis not present

## 2018-05-04 DIAGNOSIS — K219 Gastro-esophageal reflux disease without esophagitis: Secondary | ICD-10-CM | POA: Insufficient documentation

## 2018-05-04 DIAGNOSIS — D259 Leiomyoma of uterus, unspecified: Secondary | ICD-10-CM | POA: Insufficient documentation

## 2018-05-04 DIAGNOSIS — Z79899 Other long term (current) drug therapy: Secondary | ICD-10-CM | POA: Insufficient documentation

## 2018-05-04 HISTORY — PX: LAPAROSCOPIC HYSTERECTOMY: SHX1926

## 2018-05-04 HISTORY — PX: ABLATION ON ENDOMETRIOSIS: SHX5787

## 2018-05-04 HISTORY — PX: LAPAROSCOPIC BILATERAL SALPINGECTOMY: SHX5889

## 2018-05-04 HISTORY — PX: CYSTOSCOPY: SHX5120

## 2018-05-04 LAB — ABO/RH: ABO/RH(D): O POS

## 2018-05-04 LAB — POCT PREGNANCY, URINE: Preg Test, Ur: NEGATIVE

## 2018-05-04 SURGERY — HYSTERECTOMY, TOTAL, LAPAROSCOPIC
Anesthesia: General

## 2018-05-04 MED ORDER — DIPHENHYDRAMINE HCL 50 MG/ML IJ SOLN
INTRAMUSCULAR | Status: DC | PRN
  Administered 2018-05-04: 12.5 mg via INTRAVENOUS

## 2018-05-04 MED ORDER — SUCCINYLCHOLINE CHLORIDE 20 MG/ML IJ SOLN
INTRAMUSCULAR | Status: AC
  Filled 2018-05-04: qty 1

## 2018-05-04 MED ORDER — EPHEDRINE SULFATE 50 MG/ML IJ SOLN
INTRAMUSCULAR | Status: DC | PRN
  Administered 2018-05-04: 10 mg via INTRAVENOUS
  Administered 2018-05-04: 5 mg via INTRAVENOUS
  Administered 2018-05-04: 10 mg via INTRAVENOUS
  Administered 2018-05-04: 5 mg via INTRAVENOUS
  Administered 2018-05-04 (×2): 10 mg via INTRAVENOUS

## 2018-05-04 MED ORDER — ROCURONIUM BROMIDE 50 MG/5ML IV SOLN
INTRAVENOUS | Status: AC
  Filled 2018-05-04: qty 1

## 2018-05-04 MED ORDER — BUPIVACAINE HCL 0.5 % IJ SOLN
INTRAMUSCULAR | Status: DC | PRN
  Administered 2018-05-04: 21 mL

## 2018-05-04 MED ORDER — PROPOFOL 10 MG/ML IV BOLUS
INTRAVENOUS | Status: AC
  Filled 2018-05-04: qty 20

## 2018-05-04 MED ORDER — EPHEDRINE SULFATE 50 MG/ML IJ SOLN
INTRAMUSCULAR | Status: AC
  Filled 2018-05-04: qty 1

## 2018-05-04 MED ORDER — OXYCODONE HCL 5 MG PO CAPS: 5.0000 mg | ORAL_CAPSULE | Freq: Four times a day (QID) | ORAL | 0 refills | Status: DC | PRN

## 2018-05-04 MED ORDER — SEVOFLURANE IN SOLN
RESPIRATORY_TRACT | Status: AC
  Filled 2018-05-04: qty 250

## 2018-05-04 MED ORDER — LIDOCAINE HCL (PF) 2 % IJ SOLN
INTRAMUSCULAR | Status: AC
  Filled 2018-05-04: qty 10

## 2018-05-04 MED ORDER — MIDAZOLAM HCL 2 MG/2ML IJ SOLN
INTRAMUSCULAR | Status: DC | PRN
  Administered 2018-05-04: 2 mg via INTRAVENOUS

## 2018-05-04 MED ORDER — PHENYLEPHRINE HCL 10 MG/ML IJ SOLN
INTRAMUSCULAR | Status: DC | PRN
  Administered 2018-05-04 (×4): 100 ug via INTRAVENOUS

## 2018-05-04 MED ORDER — SUGAMMADEX SODIUM 200 MG/2ML IV SOLN
INTRAVENOUS | Status: AC
  Filled 2018-05-04: qty 2

## 2018-05-04 MED ORDER — DEXAMETHASONE SODIUM PHOSPHATE 10 MG/ML IJ SOLN
INTRAMUSCULAR | Status: DC | PRN
  Administered 2018-05-04: 4 mg via INTRAVENOUS

## 2018-05-04 MED ORDER — FENTANYL CITRATE (PF) 100 MCG/2ML IJ SOLN
INTRAMUSCULAR | Status: DC | PRN
  Administered 2018-05-04: 25 ug via INTRAVENOUS
  Administered 2018-05-04: 100 ug via INTRAVENOUS
  Administered 2018-05-04: 25 ug via INTRAVENOUS
  Administered 2018-05-04 (×2): 50 ug via INTRAVENOUS

## 2018-05-04 MED ORDER — DIPHENHYDRAMINE HCL 50 MG/ML IJ SOLN
INTRAMUSCULAR | Status: AC
  Filled 2018-05-04: qty 1

## 2018-05-04 MED ORDER — ONDANSETRON HCL 4 MG/2ML IJ SOLN
INTRAMUSCULAR | Status: DC | PRN
  Administered 2018-05-04: 4 mg via INTRAVENOUS

## 2018-05-04 MED ORDER — SUGAMMADEX SODIUM 200 MG/2ML IV SOLN
INTRAVENOUS | Status: DC | PRN
  Administered 2018-05-04: 160 mg via INTRAVENOUS

## 2018-05-04 MED ORDER — GABAPENTIN 800 MG PO TABS: 800.0000 mg | ORAL_TABLET | Freq: Every day | ORAL | 0 refills | Status: DC

## 2018-05-04 MED ORDER — OXYCODONE HCL 5 MG PO TABS
ORAL_TABLET | ORAL | Status: AC
  Filled 2018-05-04: qty 1

## 2018-05-04 MED ORDER — PROPOFOL 500 MG/50ML IV EMUL
INTRAVENOUS | Status: DC | PRN
  Administered 2018-05-04: 25 ug/kg/min via INTRAVENOUS
  Administered 2018-05-04: 10:00:00 via INTRAVENOUS

## 2018-05-04 MED ORDER — IBUPROFEN 800 MG PO TABS: 800.0000 mg | ORAL_TABLET | Freq: Three times a day (TID) | ORAL | 1 refills | Status: DC | PRN

## 2018-05-04 MED ORDER — MIDAZOLAM HCL 2 MG/2ML IJ SOLN
INTRAMUSCULAR | Status: AC
  Filled 2018-05-04: qty 2

## 2018-05-04 MED ORDER — DEXAMETHASONE SODIUM PHOSPHATE 10 MG/ML IJ SOLN
INTRAMUSCULAR | Status: AC
Start: 2018-05-04 — End: ?
  Filled 2018-05-04: qty 1

## 2018-05-04 MED ORDER — ACETAMINOPHEN 500 MG PO TABS: 1000.0000 mg | ORAL_TABLET | Freq: Four times a day (QID) | ORAL | 0 refills | Status: AC

## 2018-05-04 MED ORDER — LACTATED RINGERS IV SOLN: INTRAVENOUS | Status: DC

## 2018-05-04 MED ORDER — LIDOCAINE HCL (CARDIAC) PF 100 MG/5ML IV SOSY
PREFILLED_SYRINGE | INTRAVENOUS | Status: DC | PRN
  Administered 2018-05-04: 60 mg via INTRAVENOUS

## 2018-05-04 MED ORDER — KETOROLAC TROMETHAMINE 30 MG/ML IJ SOLN
INTRAMUSCULAR | Status: DC | PRN
  Administered 2018-05-04: 30 mg via INTRAVENOUS

## 2018-05-04 MED ORDER — PROPOFOL 10 MG/ML IV BOLUS
INTRAVENOUS | Status: DC | PRN
  Administered 2018-05-04: 150 mg via INTRAVENOUS

## 2018-05-04 MED ORDER — PHENYLEPHRINE HCL 10 MG/ML IJ SOLN
INTRAMUSCULAR | Status: AC
  Filled 2018-05-04: qty 1

## 2018-05-04 MED ORDER — PROPOFOL 10 MG/ML IV BOLUS
INTRAVENOUS | Status: AC
Start: 2018-05-04 — End: ?
  Filled 2018-05-04: qty 20

## 2018-05-04 MED ORDER — ROCURONIUM BROMIDE 100 MG/10ML IV SOLN
INTRAVENOUS | Status: DC | PRN
  Administered 2018-05-04: 50 mg via INTRAVENOUS
  Administered 2018-05-04 (×2): 20 mg via INTRAVENOUS
  Administered 2018-05-04: 30 mg via INTRAVENOUS

## 2018-05-04 MED ORDER — DOCUSATE SODIUM 100 MG PO CAPS: 100.0000 mg | ORAL_CAPSULE | Freq: Two times a day (BID) | ORAL | 0 refills | Status: DC

## 2018-05-04 MED ORDER — BUPIVACAINE HCL (PF) 0.5 % IJ SOLN
INTRAMUSCULAR | Status: AC
  Filled 2018-05-04: qty 30

## 2018-05-04 MED ORDER — ACETAMINOPHEN 10 MG/ML IV SOLN
INTRAVENOUS | Status: DC | PRN
  Administered 2018-05-04: 1000 mg via INTRAVENOUS

## 2018-05-04 MED ORDER — FENTANYL CITRATE (PF) 100 MCG/2ML IJ SOLN
25.0000 ug | INTRAMUSCULAR | Status: DC | PRN
  Administered 2018-05-04 (×4): 25 ug via INTRAVENOUS

## 2018-05-04 MED ORDER — ONDANSETRON HCL 4 MG/2ML IJ SOLN
INTRAMUSCULAR | Status: AC
  Filled 2018-05-04: qty 2

## 2018-05-04 MED ORDER — OXYCODONE HCL 5 MG PO TABS
5.0000 mg | ORAL_TABLET | Freq: Once | ORAL | Status: AC | PRN
  Administered 2018-05-04: 5 mg via ORAL

## 2018-05-04 MED ORDER — FENTANYL CITRATE (PF) 250 MCG/5ML IJ SOLN
INTRAMUSCULAR | Status: AC
  Filled 2018-05-04: qty 5

## 2018-05-04 MED ORDER — FENTANYL CITRATE (PF) 100 MCG/2ML IJ SOLN
INTRAMUSCULAR | Status: AC
  Administered 2018-05-04: 25 ug via INTRAVENOUS
  Filled 2018-05-04: qty 2

## 2018-05-04 MED ORDER — CEFAZOLIN SODIUM-DEXTROSE 2-4 GM/100ML-% IV SOLN
INTRAVENOUS | Status: AC
  Filled 2018-05-04: qty 100

## 2018-05-04 MED ORDER — LACTATED RINGERS IV SOLN
INTRAVENOUS | Status: DC
  Administered 2018-05-04 (×2): via INTRAVENOUS

## 2018-05-04 MED ORDER — OXYCODONE HCL 5 MG/5ML PO SOLN: 5.0000 mg | Freq: Once | ORAL | Status: AC | PRN

## 2018-05-04 SURGICAL SUPPLY — 64 items
ANCHOR TIS RET SYS 1550ML (BAG) ×5 IMPLANT
BAG URINE DRAINAGE (UROLOGICAL SUPPLIES) ×5 IMPLANT
BLADE SURG SZ11 CARB STEEL (BLADE) ×5 IMPLANT
CATH FOLEY 2WAY  5CC 16FR (CATHETERS) ×2
CATH URTH 16FR FL 2W BLN LF (CATHETERS) ×3 IMPLANT
CHLORAPREP W/TINT 26ML (MISCELLANEOUS) ×5 IMPLANT
CLOSURE WOUND 1/4X4 (GAUZE/BANDAGES/DRESSINGS)
CORD MONOPOLAR M/FML 12FT (MISCELLANEOUS) ×5 IMPLANT
COUNTER NEEDLE 20/40 LG (NEEDLE) ×5 IMPLANT
COVER LIGHT HANDLE STERIS (MISCELLANEOUS) ×10 IMPLANT
DERMABOND ADVANCED (GAUZE/BANDAGES/DRESSINGS) ×2
DERMABOND ADVANCED .7 DNX12 (GAUZE/BANDAGES/DRESSINGS) ×3 IMPLANT
DEVICE SUTURE ENDOST 10MM (ENDOMECHANICALS) ×5 IMPLANT
DRAPE STERI POUCH LG 24X46 STR (DRAPES) IMPLANT
DRSG TEGADERM 2-3/8X2-3/4 SM (GAUZE/BANDAGES/DRESSINGS) ×15 IMPLANT
GLOVE BIO SURGEON STRL SZ7 (GLOVE) ×15 IMPLANT
GLOVE INDICATOR 7.5 STRL GRN (GLOVE) ×5 IMPLANT
GOWN STRL REUS W/ TWL LRG LVL3 (GOWN DISPOSABLE) ×6 IMPLANT
GOWN STRL REUS W/ TWL XL LVL3 (GOWN DISPOSABLE) ×3 IMPLANT
GOWN STRL REUS W/TWL LRG LVL3 (GOWN DISPOSABLE) ×4
GOWN STRL REUS W/TWL XL LVL3 (GOWN DISPOSABLE) ×2
HANDLE YANKAUER SUCT BULB TIP (MISCELLANEOUS) ×5 IMPLANT
IRRIGATION STRYKERFLOW (MISCELLANEOUS) ×3 IMPLANT
IRRIGATOR STRYKERFLOW (MISCELLANEOUS) ×5
IV NS 1000ML (IV SOLUTION) ×2
IV NS 1000ML BAXH (IV SOLUTION) ×3 IMPLANT
KIT PINK PAD W/HEAD ARE REST (MISCELLANEOUS) ×5
KIT PINK PAD W/HEAD ARM REST (MISCELLANEOUS) ×3 IMPLANT
KIT TURNOVER CYSTO (KITS) ×5 IMPLANT
LABEL OR SOLS (LABEL) IMPLANT
LIGASURE VESSEL 5MM BLUNT TIP (ELECTROSURGICAL) IMPLANT
MANIPULATOR VCARE STD CRV RETR (MISCELLANEOUS) ×5 IMPLANT
NS IRRIG 500ML POUR BTL (IV SOLUTION) ×5 IMPLANT
OCCLUDER COLPOPNEUMO (BALLOONS) ×5 IMPLANT
PACK GYN LAPAROSCOPIC (MISCELLANEOUS) ×5 IMPLANT
PAD OB MATERNITY 4.3X12.25 (PERSONAL CARE ITEMS) ×5 IMPLANT
PAD PREP 24X41 OB/GYN DISP (PERSONAL CARE ITEMS) ×5 IMPLANT
POUCH SPECIMEN RETRIEVAL 10MM (ENDOMECHANICALS) IMPLANT
RETRACTOR WOUND ALXS 18CM SML (MISCELLANEOUS) ×3 IMPLANT
RTRCTR WOUND ALEXIS O 18CM SML (MISCELLANEOUS) ×5
SCISSORS METZENBAUM CVD 33 (INSTRUMENTS) IMPLANT
SET CYSTO W/LG BORE CLAMP LF (SET/KITS/TRAYS/PACK) IMPLANT
SLEEVE ENDOPATH XCEL 5M (ENDOMECHANICALS) ×5 IMPLANT
SPONGE GAUZE 2X2 8PLY STER LF (GAUZE/BANDAGES/DRESSINGS) ×1
SPONGE GAUZE 2X2 8PLY STRL LF (GAUZE/BANDAGES/DRESSINGS) ×4 IMPLANT
STRIP CLOSURE SKIN 1/4X4 (GAUZE/BANDAGES/DRESSINGS) IMPLANT
SUT ENDO VLOC 180-0-8IN (SUTURE) ×5 IMPLANT
SUT MNCRL 4-0 (SUTURE)
SUT MNCRL 4-0 27XMFL (SUTURE)
SUT MNCRL AB 4-0 PS2 18 (SUTURE) ×5 IMPLANT
SUT VIC AB 0 CT1 36 (SUTURE) ×20 IMPLANT
SUT VIC AB 2-0 UR6 27 (SUTURE) IMPLANT
SUT VIC AB 4-0 SH 27 (SUTURE)
SUT VIC AB 4-0 SH 27XANBCTRL (SUTURE) IMPLANT
SUTURE MNCRL 4-0 27XMF (SUTURE) IMPLANT
SYR 10ML LL (SYRINGE) IMPLANT
SYR 50ML LL SCALE MARK (SYRINGE) ×5 IMPLANT
TROCAR ENDO BLADELESS 11MM (ENDOMECHANICALS) IMPLANT
TROCAR XCEL NON-BLD 5MMX100MML (ENDOMECHANICALS) ×5 IMPLANT
TROCAR XCEL UNIV SLVE 11M 100M (ENDOMECHANICALS) ×10 IMPLANT
TUBING CONNECTING 10 (TUBING) ×4 IMPLANT
TUBING CONNECTING 10' (TUBING) ×1
TUBING INSUF HEATED (TUBING) ×5 IMPLANT
TUBING INSUFFLATION (TUBING) ×5 IMPLANT

## 2018-05-04 NOTE — Anesthesia Post-op Follow-up Note (Signed)
Anesthesia QCDR form completed.        

## 2018-05-04 NOTE — Anesthesia Postprocedure Evaluation (Signed)
Anesthesia Post Note  Patient: Bethany Flores  Procedure(s) Performed: HYSTERECTOMY TOTAL LAPAROSCOPIC (N/A ) LAPAROSCOPIC BILATERAL SALPINGECTOMY (Bilateral ) EXCISION OF ENDOMETRIOSIS CYSTOSCOPY  Patient location during evaluation: PACU Anesthesia Type: General Level of consciousness: awake and alert Pain management: pain level controlled Vital Signs Assessment: post-procedure vital signs reviewed and stable Respiratory status: spontaneous breathing, nonlabored ventilation, respiratory function stable and patient connected to nasal cannula oxygen Cardiovascular status: blood pressure returned to baseline and stable Postop Assessment: no apparent nausea or vomiting Anesthetic complications: no     Last Vitals:  Vitals:   05/04/18 1200 05/04/18 1300  BP: 120/64 (!) 112/59  Pulse: 82 82  Resp: 20 18  Temp:    SpO2: 99% 99%    Last Pain:  Vitals:   05/04/18 1300  TempSrc:   PainSc: 2                  Precious Haws Piscitello

## 2018-05-04 NOTE — Transfer of Care (Signed)
Immediate Anesthesia Transfer of Care Note  Patient: Bethany Flores  Procedure(s) Performed: HYSTERECTOMY TOTAL LAPAROSCOPIC (N/A ) LAPAROSCOPIC BILATERAL SALPINGECTOMY (Bilateral ) EXCISION OF ENDOMETRIOSIS  Patient Location: PACU  Anesthesia Type:General  Level of Consciousness: sedated and drowsy  Airway & Oxygen Therapy: Patient Spontanous Breathing and Patient connected to face mask oxygen  Post-op Assessment: Report given to RN and Post -op Vital signs reviewed and stable  Post vital signs: Reviewed and stable  Last Vitals:  Vitals Value Taken Time  BP 105/61 05/04/2018 10:47 AM  Temp    Pulse 71 05/04/2018 10:47 AM  Resp 16 05/04/2018 10:47 AM  SpO2 100 % 05/04/2018 10:47 AM  Vitals shown include unvalidated device data.  Last Pain:  Vitals:   05/04/18 0615  TempSrc: Temporal  PainSc: 0-No pain         Complications: No apparent anesthesia complications and Patient re-intubated

## 2018-05-04 NOTE — Anesthesia Preprocedure Evaluation (Signed)
Anesthesia Evaluation  Patient identified by MRN, date of birth, ID band Patient awake    Reviewed: Allergy & Precautions, H&P , NPO status , Patient's Chart, lab work & pertinent test results  History of Anesthesia Complications Negative for: history of anesthetic complications  Airway Mallampati: II  TM Distance: >3 FB Neck ROM: full    Dental  (+) Chipped   Pulmonary neg pulmonary ROS, neg shortness of breath,           Cardiovascular Exercise Tolerance: Good (-) angina(-) Past MI and (-) DOE negative cardio ROS       Neuro/Psych PSYCHIATRIC DISORDERS Anxiety Depression  Neuromuscular disease    GI/Hepatic Neg liver ROS, GERD  Medicated and Controlled,  Endo/Other  negative endocrine ROS  Renal/GU      Musculoskeletal   Abdominal   Peds  Hematology negative hematology ROS (+)   Anesthesia Other Findings Past Medical History: No date: Anemia No date: Anxiety No date: Depression No date: GERD (gastroesophageal reflux disease)     Comment:  OCC-TUMS PRN  Past Surgical History: 02/16/2018: BACK SURGERY     Comment:  LOWER LAMINECTOMY 04/26/2015: CHOLECYSTECTOMY; N/A     Comment:  Procedure: LAPAROSCOPIC CHOLECYSTECTOMY WITH               INTRAOPERATIVE CHOLANGIOGRAM;  Surgeon: Molly Maduro,              MD;  Location: [facility] ORS;  Service: General;  Laterality:               N/A; 02/26/2016: COLONOSCOPY WITH PROPOFOL; N/A     Comment:  Procedure: COLONOSCOPY WITH PROPOFOL;  Surgeon: Lollie Sails, MD;  Location: [facility] ENDOSCOPY;  Service:               Endoscopy;  Laterality: N/A; 11/15/2016: ERCP; N/A     Comment:  Procedure: ENDOSCOPIC RETROGRADE               CHOLANGIOPANCREATOGRAPHY (ERCP);  Surgeon: Lucilla Lame,               MD;  Location: [facility] ENDOSCOPY;  Service: Endoscopy;                Laterality: N/A;  Inpatient RM [id number] 01/14/2017: ERCP; N/A     Comment:  Procedure: ENDOSCOPIC  RETROGRADE               CHOLANGIOPANCREATOGRAPHY (ERCP);  Surgeon: Lucilla Lame,               MD;  Location: [facility] ENDOSCOPY;  Service: Endoscopy;                Laterality: N/A; No date: LAPAROSCOPIC GASTRIC SLEEVE RESECTION  BMI    Body Mass Index:  30.51 kg/m      Reproductive/Obstetrics negative OB ROS                             Anesthesia Physical Anesthesia Plan  ASA: III  Anesthesia Plan: General ETT   Post-op Pain Management:    Induction: Intravenous  PONV Risk Score and Plan: Ondansetron, Dexamethasone and Midazolam  Airway Management Planned: Oral ETT  Additional Equipment:   Intra-op Plan:   Post-operative Plan: Extubation in OR  Informed Consent: I have reviewed the patients History and Physical, chart, labs and discussed the procedure including the risks, benefits and  alternatives for the proposed anesthesia with the patient or authorized representative who has indicated his/her understanding and acceptance.   Dental Advisory Given  Plan Discussed with: Anesthesiologist, CRNA and Surgeon  Anesthesia Plan Comments: (Patient consented for risks of anesthesia including but not limited to:  - adverse reactions to medications - damage to teeth, lips or other oral mucosa - sore throat or hoarseness - Damage to heart, brain, lungs or loss of life  Patient voiced understanding.)        Anesthesia Quick Evaluation

## 2018-05-04 NOTE — Discharge Instructions (Signed)
AMBULATORY SURGERY  DISCHARGE INSTRUCTIONS   1) The drugs that you were given will stay in your system until tomorrow so for the next 24 hours you should not:  A) Drive an automobile B) Make any legal decisions C) Drink any alcoholic beverage   2) You may resume regular meals tomorrow.  Today it is better to start with liquids and gradually work up to solid foods.  You may eat anything you prefer, but it is better to start with liquids, then soup and crackers, and gradually work up to solid foods.   3) Please notify your doctor immediately if you have any unusual bleeding, trouble breathing, redness and pain at the surgery site, drainage, fever, or pain not relieved by medication. 4)   5) Your post-operative visit with Dr.                                     is: Date:                        Time:    Please call to schedule your post-operative visit.  6) Additional Instructions:     Discharge instructions after   total laparoscopic hysterectomy   For the next three days, take ibuprofen and acetaminophen on a schedule, every 8 hours. You can take them together or you can intersperse them, and take one every four hours. I also gave you gabapentin for nighttime, to help you sleep and also to control pain. Take gabapentin medicines at night for at least the next 3 nights. You also have a narcotic, oxycodone, to take as needed if the above medicines don't help.  Postop constipation is a major cause of pain. Stay well hydrated, walk as you tolerate, and take over the counter senna as well as stool softeners if you need them.    Signs and Symptoms to Report Call our office at [phone] if you have any of the following.  . Fever over 100.4 degrees or higher . Severe stomach pain not relieved with pain medications . Bright red bleeding that's heavier than a period that does not slow with rest . To go the bathroom a lot (frequency), you can't hold your urine (urgency), or it  hurts when you empty your bladder (urinate) . Chest pain . Shortness of breath . Pain in the calves of your legs . Severe nausea and vomiting not relieved with anti-nausea medications . Signs of infection around your wounds, such as redness, hot to touch, swelling, green/yellow drainage (like pus), bad smelling discharge . Any concerns  What You Can Expect after Surgery . You may see some pink tinged, bloody fluid and bruising around the wound. This is normal. . You may notice shoulder and neck pain. This is caused by the gas used during surgery to expand your abdomen so your surgeon could get to the uterus easier. . You may have a sore throat because of the tube in your mouth during general anesthesia. This will go away in 2 to 3 days. . You may have some stomach cramps. . You may notice spotting on your panties. . You may have pain around the incision sites.   Activities after Your Discharge Follow these guidelines to help speed your recovery at home: . Do the coughing and deep breathing as you did in the hospital for 2 weeks. Use the small blue breathing device,   called the incentive spirometer for 2 weeks. . Don't drive if you are in pain or taking narcotic pain medicine. You may drive when you can safely slam on the brakes, turn the wheel forcefully, and rotate your torso comfortably. This is typically 1-2 weeks. Practice in a parking lot or side street prior to attempting to drive regularly.  . Ask others to help with household chores for 4 weeks. . Do not lift anything heavier that 10 pounds for 4-6 weeks. This includes pets, children, and groceries. . Don't do strenuous activities, exercises, or sports like vacuuming, tennis, squash, etc. until your doctor says it is safe to do so. ---Maintain pelvic rest for 8 weeks. This means nothing in the vagina or rectum at all (no douching, tampons, intercourse) for 8 weeks.  . Walk as you feel able. Rest often since it may take two or three  weeks for your energy level to return to normal.  . You may climb stairs . Avoid constipation:   -Eat fruits, vegetables, and whole grains. Eat small meals as your appetite will take time to return to normal.   -Drink 6 to 8 glasses of water each day unless your doctor has told you to limit your fluids.   -Use a laxative or stool softener as needed if constipation becomes a problem. You may take Miralax, metamucil, Citrucil, Colace, Senekot, FiberCon, etc. If this does not relieve the constipation, try two tablespoons of Milk Of Magnesia every 8 hours until your bowels move.  . You may shower. Gently wash the wounds with a mild soap and water. Pat dry. . Do not get in a hot tub, swimming pool, etc. for 6 weeks. . Do not use lotions, oils, powders on the wounds. . Do not douche, use tampons, or have sex until your doctor says it is okay. . Take your pain medicine when you need it. The medicine may not work as well if the pain is bad.  Take the medicines you were taking before surgery. Other medications you will need are pain medications and possibly constipation and nausea medications (Zofran).   

## 2018-05-04 NOTE — Anesthesia Procedure Notes (Signed)
Procedure Name: Intubation Date/Time: 05/04/2018 7:57 AM Performed by: Andria Frames, MD Pre-anesthesia Checklist: Patient identified, Emergency Drugs available, Suction available, Patient being monitored and Timeout performed Patient Re-evaluated:Patient Re-evaluated prior to induction Oxygen Delivery Method: Circle system utilized Preoxygenation: Pre-oxygenation with 100% oxygen Induction Type: IV induction Ventilation: Mask ventilation without difficulty Laryngoscope Size: Mac and 3 Grade View: Grade I Tube type: Oral Tube size: 7.0 mm Number of attempts: 1 Airway Equipment and Method: Stylet Placement Confirmation: ETT inserted through vocal cords under direct vision,  positive ETCO2 and breath sounds checked- equal and bilateral Secured at: 21 cm Tube secured with: Tape Dental Injury: Teeth and Oropharynx as per pre-operative assessment

## 2018-05-04 NOTE — Interval H&P Note (Signed)
History and Physical Interval Note:  05/04/2018 7:48 AM  Bethany Flores  has presented today for surgery, with the diagnosis of AUB  Fibroid  The various methods of treatment have been discussed with the patient and family. After consideration of risks, benefits and other options for treatment, the patient has consented to  Procedure(s): HYSTERECTOMY TOTAL LAPAROSCOPIC (N/A) LAPAROSCOPIC BILATERAL SALPINGECTOMY (Bilateral) as a surgical intervention .  The patient's history has been reviewed, patient examined, no change in status, stable for surgery.  I have reviewed the patient's chart and labs.  Questions were answered to the patient's satisfaction.     Benjaman Kindler

## 2018-05-04 NOTE — Op Note (Signed)
Bethany Flores PROCEDURE DATE: 05/04/2018  PREOPERATIVE DIAGNOSIS: Menorrhagia - fibroids POSTOPERATIVE DIAGNOSIS: The same PROCEDURE: Total laparoscopic hysterectomy, bilateral salpingectomy with vaginal morcellation in a bag, cystoscopy SURGEON:  Dr. Benjaman Kindler ASSISTANT: Dr. Laverta Baltimore Anesthesiologist:  Anesthesiologist: Piscitello, Precious Haws, MD CRNA: Hedda Slade, CRNA  INDICATIONS: 52 y.o. F here for definitive surgical management secondary to the indications listed under preoperative diagnoses; please see preoperative note for further details.  Risks of surgery were discussed with the patient including but not limited to: bleeding which may require transfusion or reoperation; infection which may require antibiotics; injury to bowel, bladder, ureters or other surrounding organs; need for additional procedures; thromboembolic phenomenon, incisional problems and other postoperative/anesthesia complications. Written informed consent was obtained.    FINDINGS:  Enlarged and broad uterus, filling the pelvis. Normal vagina, cervix and perineum. Large intramural fundal fibroid. Normal ovarian and tubes bilaterally. No intraabdominal adhesions from prior surgeries noted.  ANESTHESIA:    General INTRAVENOUS FLUIDS:1000  ml ESTIMATED BLOOD LOSS:150 ml URINE OUTPUT: 200 ml   SPECIMENS: Uterus, cervix, bilateral fallopian tubes  COMPLICATIONS: None immediate  PROCEDURE IN DETAIL:  The patient received prophalactic intravenous antibiotics and had sequential compression devices applied to her lower extremities while in the preoperative area.  She was then taken to the operating room where general anesthesia was administered and was found to be adequate.  She was placed in the dorsal lithotomy position, and was prepped and draped in a sterile manner.  A formal time out was performed with all team members present and in agreement.  A V-care uterine manipulator was placed at this time.   A Foley catheter was inserted into her bladder and attached to constant drainage. Attention was turned to the abdomen and 0.5% Marcaine infused subq. A 66mm umbilical incision was made with the scalpel.  The Optiview 11-mm trocar and sleeve were then advanced without difficulty with the laparoscope under direct visualization into the abdomen.  The abdomen was then insufflated with carbon dioxide gas and adequate pneumoperitoneum was obtained.  A survey of the patient's pelvis and abdomen revealed the findings above.    Bilateral lower quadrant ports (5 mm on the right and 11 mm on the left) were then placed under direct visualization.  The pelvis was then carefully examined.  Attention was turned to the fallopian tubes; these were freed from the underlying mesosalpinx and the uterine attachments using the Ligasure device.  The bilateral round and broad ligaments were then clamped and transected with the Ligasure device.  The uterine artery was then skeletonized and a bladder flap was created.  The ureters were noted to be safely away from the area of dissection.  The bladder was then bluntly dissected off the lower uterine segment.    At this point, attention was turned to the uterine vessels, which were clamped and cauterized using the Ligasure on the left, and then the right. Kleppinger fulguration was required to control bleeding. After the uterine blood flow at the level of the internal os was controlled, both arteries were cut with the Ligasure.  Good hemostasis was noted overall.  The uterosacral and cardinal ligaments were clamped, cut and ligated bilaterally .  Attention was then turned to the cervicovaginal junction, and monopolar scissors were used to transect the cervix from the surrounding vagina using the ring of the V-care as a guide. This was done circumferentially allowing total hysterectomy.  The uterus was then placed in a 64mm bag, which was run through the vagina,  and the uterus and fibroid  were hand-morcellated. The pieces and then the bag were removed from the vagina and the vaginal cuff incision was then closed with running 0-Vicryl suture.  Overall excellent hemostasis was noted.    Attention was returned to the abdomen.The ureters were reexamined bilaterally and were pulsating normally. The abdominal pressure was reduced and hemostasis was confirmed.   Cystoscopy showed bilateral ureteral jets.  No stitches were visualized in the bladder during cystoscopy.  The 73mm port fascia was closed with a vertical mattress with 0-Vicryl, using the cone closure system. All trocars were removed under direct visualization, and the abdomen was desufflated.  All skin incisions were closed with 4-0 Vicryl subcuticular stitches and Dermabond. The patient tolerated the procedures well.  All instruments, needles, and sponge counts were correct x 2. The patient was taken to the recovery room awake, extubated and in stable condition.

## 2018-05-05 LAB — SURGICAL PATHOLOGY

## 2018-05-18 ENCOUNTER — Other Ambulatory Visit: Payer: BLUE CROSS/BLUE SHIELD

## 2018-06-10 ENCOUNTER — Encounter: Payer: Self-pay | Admitting: Oncology

## 2018-08-17 NOTE — Progress Notes (Signed)
Bethany Flores  [phone] Fax:([phone]) [phone]  ID: [name] OB: 1966/03/03  MR#: [id number]  TJQ#:[id number]  Patient Care Team: Bing Quarry, MD as PCP - General (Family Medicine)  CHIEF COMPLAINT: Iron deficiency anemia.  INTERVAL HISTORY: Patient returns to clinic today for repeat laboratory work and further evaluation.  She underwent laminectomy and spinal fusion surgery in April 2019.  She then had a complete hysterectomy in June 2019.  She currently feels well and is asymptomatic.  She does not complain of weakness and fatigue today.  She denies any pain. She has no neurologic complaints. She denies any recent fevers or illnesses. She has a good appetite and denies weight loss. She has no chest pain or shortness of breath. She denies any nausea, vomiting, constipation, or diarrhea.  She denies any melena or hematochezia.  She has no urinary complaints.  Patient feels at her baseline offers no specific complaints today.  REVIEW OF SYSTEMS:   Review of Systems  Constitutional: Negative.  Negative for fever, malaise/fatigue and weight loss.  Respiratory: Negative.  Negative for shortness of breath.   Cardiovascular: Negative.  Negative for chest pain and leg swelling.  Gastrointestinal: Negative.  Negative for abdominal pain, blood in stool and melena.  Genitourinary: Negative.  Negative for hematuria.  Musculoskeletal: Negative.  Negative for back pain and neck pain.  Skin: Negative.  Negative for rash.  Neurological: Negative.  Negative for sensory change, focal weakness and weakness.  Psychiatric/Behavioral: Negative.  The patient is not nervous/anxious.     As per HPI. Otherwise, a complete review of systems is negative.  PAST MEDICAL HISTORY: Past Medical History:  Diagnosis Date  . Anemia   . Anxiety   . Depression   . GERD (gastroesophageal reflux disease)    OCC-TUMS PRN    PAST SURGICAL HISTORY: Past Surgical History:    Procedure Laterality Date  . ABLATION ON ENDOMETRIOSIS  05/04/2018   Procedure: EXCISION OF ENDOMETRIOSIS;  Surgeon: Benjaman Kindler, MD;  Location: [facility] ORS;  Service: Gynecology;;  . BACK SURGERY  02/16/2018   LOWER LAMINECTOMY  . CHOLECYSTECTOMY N/A 04/26/2015   Procedure: LAPAROSCOPIC CHOLECYSTECTOMY WITH INTRAOPERATIVE CHOLANGIOGRAM;  Surgeon: Molly Maduro, MD;  Location: [facility] ORS;  Service: General;  Laterality: N/A;  . COLONOSCOPY WITH PROPOFOL N/A 02/26/2016   Procedure: COLONOSCOPY WITH PROPOFOL;  Surgeon: Lollie Sails, MD;  Location: [facility] ENDOSCOPY;  Service: Endoscopy;  Laterality: N/A;  . CYSTOSCOPY  05/04/2018   Procedure: CYSTOSCOPY;  Surgeon: Benjaman Kindler, MD;  Location: [facility] ORS;  Service: Gynecology;;  . ERCP N/A 11/15/2016   Procedure: ENDOSCOPIC RETROGRADE CHOLANGIOPANCREATOGRAPHY (ERCP);  Surgeon: Lucilla Lame, MD;  Location: [facility] ENDOSCOPY;  Service: Endoscopy;  Laterality: N/A;  Inpatient RM [id number]  . ERCP N/A 01/14/2017   Procedure: ENDOSCOPIC RETROGRADE CHOLANGIOPANCREATOGRAPHY (ERCP);  Surgeon: Lucilla Lame, MD;  Location: [facility] ENDOSCOPY;  Service: Endoscopy;  Laterality: N/A;  . LAPAROSCOPIC BILATERAL SALPINGECTOMY Bilateral 05/04/2018   Procedure: LAPAROSCOPIC BILATERAL SALPINGECTOMY;  Surgeon: Benjaman Kindler, MD;  Location: [facility] ORS;  Service: Gynecology;  Laterality: Bilateral;  . LAPAROSCOPIC GASTRIC SLEEVE RESECTION    . LAPAROSCOPIC HYSTERECTOMY N/A 05/04/2018   Procedure: HYSTERECTOMY TOTAL LAPAROSCOPIC;  Surgeon: Benjaman Kindler, MD;  Location: [facility] ORS;  Service: Gynecology;  Laterality: N/A;    FAMILY HISTORY Family History  Problem Relation Age of Onset  . Diabetes Paternal Grandmother   . Diabetes Paternal Grandfather   . Breast cancer Neg Hx        ADVANCED DIRECTIVES:  HEALTH MAINTENANCE: Social History   Tobacco Use  . Smoking status: Never Smoker  . Smokeless tobacco: Never Used  Substance Use Topics  . Alcohol use: Yes     Alcohol/week: 0.0 standard drinks    Comment: occasionally  . Drug use: No     Colonoscopy:  PAP:  Bone density:  Lipid panel:  No Known Allergies  Current Outpatient Medications  Medication Sig Dispense Refill  . Calcium Carbonate-Vitamin D 600-200 MG-UNIT TABS Take 1 tablet by mouth daily with lunch.     . ibuprofen (ADVIL,MOTRIN) 800 MG tablet Take 1 tablet (800 mg total) by mouth every 8 (eight) hours as needed for moderate pain. 30 tablet 1  . Multiple Vitamins-Minerals (MULTIVITAMIN ADULT) TABS Take 1 tablet by mouth daily.    . sertraline (ZOLOFT) 50 MG tablet Take 50 mg by mouth every morning.     . docusate sodium (COLACE) 100 MG capsule Take 1 capsule (100 mg total) by mouth 2 (two) times daily. To keep stools soft (Patient not taking: Reported on 08/18/2018) 30 capsule 0  . gabapentin (NEURONTIN) 800 MG tablet Take 1 tablet (800 mg total) by mouth at bedtime for 14 days. Take nightly for 3 days, then up to 14 days as needed 14 tablet 0  . Omega-3 Fatty Acids (FISH OIL) 1000 MG CAPS Take by mouth.    Marland Kitchen oxycodone (OXY-IR) 5 MG capsule Take 1 capsule (5 mg total) by mouth every 6 (six) hours as needed for pain. (Patient not taking: Reported on 08/18/2018) 15 capsule 0   No current facility-administered medications for this visit.     OBJECTIVE: Vitals:   08/18/18 1417  BP: 117/75  Pulse: 64  Resp: 18  Temp: (!) 97.3 F (36.3 C)     Body mass index is 29.7 kg/m.    ECOG FS:0 - Asymptomatic  General: Well-developed, well-nourished, no acute distress. Eyes: Pink conjunctiva, anicteric sclera. HEENT: Normocephalic, moist mucous membranes. Lungs: Clear to auscultation bilaterally. Heart: Regular rate and rhythm. No rubs, murmurs, or gallops. Abdomen: Soft, nontender, nondistended. No organomegaly noted, normoactive bowel sounds. Musculoskeletal: No edema, cyanosis, or clubbing. Neuro: Alert, answering all questions appropriately. Cranial nerves grossly intact. Skin: No  rashes or petechiae noted. Psych: Normal affect.  LAB RESULTS:  Lab Results  Component Value Date   NA 137 04/29/2018   K 3.9 04/29/2018   CL 105 04/29/2018   CO2 25 04/29/2018   GLUCOSE 84 04/29/2018   BUN 13 04/29/2018   CREATININE 0.73 04/29/2018   CALCIUM 8.5 (L) 04/29/2018   PROT 6.9 11/16/2016   ALBUMIN 3.3 (L) 11/16/2016   AST 115 (H) 11/16/2016   ALT 164 (H) 11/16/2016   ALKPHOS 301 (H) 11/16/2016   BILITOT 3.2 (H) 11/16/2016   GFRNONAA >60 04/29/2018   GFRAA >60 04/29/2018    Lab Results  Component Value Date   WBC 6.2 08/18/2018   NEUTROABS 3.2 08/18/2018   HGB 12.3 08/18/2018   HCT 37.1 08/18/2018   MCV 83.5 08/18/2018   PLT 358 08/18/2018   Lab Results  Component Value Date   IRON 24 (L) 08/18/2018   TIBC 420 08/18/2018   IRONPCTSAT 6 (L) 08/18/2018   Lab Results  Component Value Date   FERRITIN 8 (L) 08/18/2018      STUDIES: No results found.  ASSESSMENT: Iron deficiency anemia.  PLAN:    1. Iron deficiency anemia: Patient's hemoglobin is now within normal limits, although she has persistently decreased iron stores.  Previously, the  remainder of her laboratory work was either negative or within normal limits.  We discussed the possibility of receiving IV Feraheme today, but patient declined.  She will likely need treatment at her next clinic visit.  It appears she only requires treatment every 6 months.  No intervention needed.  Return to clinic in 6 months with repeat laboratory work and further evaluation.   2. Positive ANA: Repeat testing on September 02, 2017 is reported as negative.  No further intervention is needed. 3.  Spinal surgery: Occurred on February 16, 2018.  Patient is now fully recovered.   Patient expressed understanding and was in agreement with this plan. She also understands that She can call clinic at any time with any questions, concerns, or complaints.    Lloyd Huger, MD   08/21/2018 3:56 PM

## 2018-08-18 ENCOUNTER — Other Ambulatory Visit: Payer: Self-pay

## 2018-08-18 ENCOUNTER — Encounter: Payer: Self-pay | Admitting: Oncology

## 2018-08-18 ENCOUNTER — Inpatient Hospital Stay: Payer: BLUE CROSS/BLUE SHIELD | Attending: Oncology

## 2018-08-18 ENCOUNTER — Inpatient Hospital Stay: Payer: BLUE CROSS/BLUE SHIELD

## 2018-08-18 ENCOUNTER — Inpatient Hospital Stay (HOSPITAL_BASED_OUTPATIENT_CLINIC_OR_DEPARTMENT_OTHER): Payer: BLUE CROSS/BLUE SHIELD | Admitting: Oncology

## 2018-08-18 VITALS — BP 117/75 | HR 64 | Temp 97.3°F | Resp 18 | Wt 184.0 lb

## 2018-08-18 DIAGNOSIS — D509 Iron deficiency anemia, unspecified: Secondary | ICD-10-CM

## 2018-08-18 DIAGNOSIS — C787 Secondary malignant neoplasm of liver and intrahepatic bile duct: Secondary | ICD-10-CM | POA: Diagnosis not present

## 2018-08-18 DIAGNOSIS — R197 Diarrhea, unspecified: Secondary | ICD-10-CM

## 2018-08-18 DIAGNOSIS — M545 Low back pain: Secondary | ICD-10-CM

## 2018-08-18 DIAGNOSIS — Z170 Estrogen receptor positive status [ER+]: Secondary | ICD-10-CM

## 2018-08-18 DIAGNOSIS — R740 Nonspecific elevation of levels of transaminase and lactic acid dehydrogenase [LDH]: Secondary | ICD-10-CM

## 2018-08-18 DIAGNOSIS — C50411 Malignant neoplasm of upper-outer quadrant of right female breast: Secondary | ICD-10-CM | POA: Diagnosis not present

## 2018-08-18 DIAGNOSIS — D649 Anemia, unspecified: Secondary | ICD-10-CM

## 2018-08-18 DIAGNOSIS — Z79899 Other long term (current) drug therapy: Secondary | ICD-10-CM

## 2018-08-18 DIAGNOSIS — K630 Abscess of intestine: Secondary | ICD-10-CM

## 2018-08-18 DIAGNOSIS — K5900 Constipation, unspecified: Secondary | ICD-10-CM

## 2018-08-18 DIAGNOSIS — R300 Dysuria: Secondary | ICD-10-CM | POA: Diagnosis not present

## 2018-08-18 DIAGNOSIS — E876 Hypokalemia: Secondary | ICD-10-CM

## 2018-08-18 LAB — CBC WITH DIFFERENTIAL/PLATELET
BASOS PCT: 1 %
Basophils Absolute: 0 10*3/uL (ref 0–0.1)
EOS PCT: 1 %
Eosinophils Absolute: 0.1 10*3/uL (ref 0–0.7)
HCT: 37.1 % (ref 35.0–47.0)
Hemoglobin: 12.3 g/dL (ref 12.0–16.0)
Lymphocytes Relative: 40 %
Lymphs Abs: 2.5 10*3/uL (ref 1.0–3.6)
MCH: 27.8 pg (ref 26.0–34.0)
MCHC: 33.3 g/dL (ref 32.0–36.0)
MCV: 83.5 fL (ref 80.0–100.0)
MONO ABS: 0.3 10*3/uL (ref 0.2–0.9)
Monocytes Relative: 6 %
Neutro Abs: 3.2 10*3/uL (ref 1.4–6.5)
Neutrophils Relative %: 52 %
PLATELETS: 358 10*3/uL (ref 150–440)
RBC: 4.44 MIL/uL (ref 3.80–5.20)
RDW: 14.1 % (ref 11.5–14.5)
WBC: 6.2 10*3/uL (ref 3.6–11.0)

## 2018-08-18 LAB — IRON AND TIBC
IRON: 24 ug/dL — AB (ref 28–170)
SATURATION RATIOS: 6 % — AB (ref 10.4–31.8)
TIBC: 420 ug/dL (ref 250–450)
UIBC: 396 ug/dL

## 2018-08-18 LAB — FERRITIN: FERRITIN: 8 ng/mL — AB (ref 11–307)

## 2018-08-18 NOTE — Progress Notes (Signed)
Patient here for follow up. Pt had a laminectomy done at [facility] on 02/16/18 and she had a complete hysterectomy in June 2019. Pt states feeling "great."

## 2018-08-20 ENCOUNTER — Encounter: Payer: Self-pay | Admitting: Oncology

## 2018-08-24 NOTE — Telephone Encounter (Signed)
Called and left pt message to contact me to schedule feraheme infusion appt.

## 2018-08-28 ENCOUNTER — Inpatient Hospital Stay: Payer: BLUE CROSS/BLUE SHIELD

## 2018-08-28 VITALS — BP 111/70 | HR 63 | Temp 98.6°F | Resp 18

## 2018-08-28 DIAGNOSIS — D509 Iron deficiency anemia, unspecified: Secondary | ICD-10-CM | POA: Diagnosis not present

## 2018-08-28 MED ORDER — SODIUM CHLORIDE 0.9 % IV SOLN
510.0000 mg | Freq: Once | INTRAVENOUS | Status: AC
  Administered 2018-08-28: 510 mg via INTRAVENOUS
  Filled 2018-08-28: qty 17

## 2018-08-28 MED ORDER — SODIUM CHLORIDE 0.9 % IV SOLN
Freq: Once | INTRAVENOUS | Status: AC
  Administered 2018-08-28: 14:00:00 via INTRAVENOUS
  Filled 2018-08-28: qty 250

## 2018-11-02 ENCOUNTER — Encounter: Payer: Self-pay | Admitting: Student

## 2018-11-03 ENCOUNTER — Ambulatory Visit
Admission: RE | Admit: 2018-11-03 | Discharge: 2018-11-03 | Disposition: A | Discharge status: Home / Self Care | Payer: BLUE CROSS/BLUE SHIELD | Source: Ambulatory Visit | Attending: Gastroenterology | Admitting: Gastroenterology

## 2018-11-03 ENCOUNTER — Ambulatory Visit: Payer: BLUE CROSS/BLUE SHIELD | Admitting: Certified Registered"

## 2018-11-03 ENCOUNTER — Encounter
Admission: RE | Disposition: A | Discharge status: Home / Self Care | Payer: Self-pay | Source: Ambulatory Visit | Attending: Gastroenterology

## 2018-11-03 DIAGNOSIS — F329 Major depressive disorder, single episode, unspecified: Secondary | ICD-10-CM | POA: Insufficient documentation

## 2018-11-03 DIAGNOSIS — F419 Anxiety disorder, unspecified: Secondary | ICD-10-CM | POA: Diagnosis not present

## 2018-11-03 DIAGNOSIS — Z791 Long term (current) use of non-steroidal anti-inflammatories (NSAID): Secondary | ICD-10-CM | POA: Diagnosis not present

## 2018-11-03 DIAGNOSIS — M5136 Other intervertebral disc degeneration, lumbar region: Secondary | ICD-10-CM | POA: Insufficient documentation

## 2018-11-03 DIAGNOSIS — M1990 Unspecified osteoarthritis, unspecified site: Secondary | ICD-10-CM | POA: Insufficient documentation

## 2018-11-03 DIAGNOSIS — D509 Iron deficiency anemia, unspecified: Secondary | ICD-10-CM | POA: Insufficient documentation

## 2018-11-03 DIAGNOSIS — Z79899 Other long term (current) drug therapy: Secondary | ICD-10-CM | POA: Insufficient documentation

## 2018-11-03 DIAGNOSIS — R1013 Epigastric pain: Secondary | ICD-10-CM | POA: Diagnosis present

## 2018-11-03 DIAGNOSIS — Z9884 Bariatric surgery status: Secondary | ICD-10-CM | POA: Insufficient documentation

## 2018-11-03 DIAGNOSIS — K3189 Other diseases of stomach and duodenum: Secondary | ICD-10-CM | POA: Insufficient documentation

## 2018-11-03 DIAGNOSIS — K5790 Diverticulosis of intestine, part unspecified, without perforation or abscess without bleeding: Secondary | ICD-10-CM | POA: Diagnosis not present

## 2018-11-03 DIAGNOSIS — K2960 Other gastritis without bleeding: Secondary | ICD-10-CM | POA: Diagnosis not present

## 2018-11-03 DIAGNOSIS — K2210 Ulcer of esophagus without bleeding: Secondary | ICD-10-CM | POA: Insufficient documentation

## 2018-11-03 DIAGNOSIS — G5600 Carpal tunnel syndrome, unspecified upper limb: Secondary | ICD-10-CM | POA: Diagnosis not present

## 2018-11-03 DIAGNOSIS — K449 Diaphragmatic hernia without obstruction or gangrene: Secondary | ICD-10-CM | POA: Insufficient documentation

## 2018-11-03 HISTORY — DX: Plantar fascial fibromatosis: M72.2

## 2018-11-03 HISTORY — PX: ESOPHAGOGASTRODUODENOSCOPY: SHX5428

## 2018-11-03 HISTORY — DX: Diverticulosis of intestine, part unspecified, without perforation or abscess without bleeding: K57.90

## 2018-11-03 HISTORY — DX: Personal history of other diseases of the nervous system and sense organs: Z86.69

## 2018-11-03 HISTORY — DX: Other intervertebral disc degeneration, lumbar region without mention of lumbar back pain or lower extremity pain: M51.369

## 2018-11-03 HISTORY — DX: Other intervertebral disc degeneration, lumbar region: M51.36

## 2018-11-03 HISTORY — DX: Unspecified osteoarthritis, unspecified site: M19.90

## 2018-11-03 SURGERY — EGD (ESOPHAGOGASTRODUODENOSCOPY)
Anesthesia: General

## 2018-11-03 MED ORDER — EPHEDRINE SULFATE 50 MG/ML IJ SOLN
INTRAMUSCULAR | Status: DC | PRN
  Administered 2018-11-03 (×2): 5 mg via INTRAVENOUS

## 2018-11-03 MED ORDER — SODIUM CHLORIDE 0.9 % IV SOLN
INTRAVENOUS | Status: DC
  Administered 2018-11-03: 1000 mL via INTRAVENOUS

## 2018-11-03 MED ORDER — PROPOFOL 500 MG/50ML IV EMUL
INTRAVENOUS | Status: DC | PRN
  Administered 2018-11-03: 100 ug/kg/min via INTRAVENOUS

## 2018-11-03 MED ORDER — SODIUM CHLORIDE 0.9 % IV SOLN: INTRAVENOUS | Status: DC

## 2018-11-03 MED ORDER — EPHEDRINE SULFATE 50 MG/ML IJ SOLN
INTRAMUSCULAR | Status: AC
  Filled 2018-11-03: qty 1

## 2018-11-03 MED ORDER — MIDAZOLAM HCL 2 MG/2ML IJ SOLN
INTRAMUSCULAR | Status: AC
  Filled 2018-11-03: qty 2

## 2018-11-03 MED ORDER — PROPOFOL 10 MG/ML IV BOLUS
INTRAVENOUS | Status: AC
  Filled 2018-11-03: qty 40

## 2018-11-03 MED ORDER — MIDAZOLAM HCL 2 MG/2ML IJ SOLN
INTRAMUSCULAR | Status: DC | PRN
  Administered 2018-11-03: 2 mg via INTRAVENOUS

## 2018-11-03 MED ORDER — PROPOFOL 10 MG/ML IV BOLUS
INTRAVENOUS | Status: DC | PRN
  Administered 2018-11-03: 20 mg via INTRAVENOUS
  Administered 2018-11-03: 50 mg via INTRAVENOUS
  Administered 2018-11-03: 20 mg via INTRAVENOUS

## 2018-11-03 NOTE — Transfer of Care (Signed)
Immediate Anesthesia Transfer of Care Note  Patient: Bethany Flores  Procedure(s) Performed: ESOPHAGOGASTRODUODENOSCOPY (EGD) (N/A )  Patient Location: Endoscopy Unit  Anesthesia Type:General  Level of Consciousness: awake  Airway & Oxygen Therapy: Patient Spontanous Breathing  Post-op Assessment: Report given to RN and Post -op Vital signs reviewed and stable  Post vital signs: stable  Last Vitals:  Vitals Value Taken Time  BP 87/62 11/03/2018  8:06 AM  Temp 36.3 C 11/03/2018  8:06 AM  Pulse 78 11/03/2018  8:11 AM  Resp 16 11/03/2018  8:11 AM  SpO2 99 % 11/03/2018  8:11 AM    Last Pain:  Vitals:   11/03/18 0806  TempSrc: Tympanic  PainSc: 0-No pain         Complications: No apparent anesthesia complications

## 2018-11-03 NOTE — Op Note (Signed)
[facility] Gastroenterology Patient Name: Bethany Flores Procedure Date: 11/03/2018 7:37 AM MRN: [id number] Account #: [id number] Date of Birth: 1966-08-25 Admit Type: Outpatient Age: 52 Room: [facility] ENDO ROOM 3 Gender: Female Note Status: Finalized Procedure:            Upper GI endoscopy Indications:          Epigastric abdominal pain, Dyspepsia Providers:            Lollie Sails, MD Referring MD:         Youlanda Roys. Lovie Macadamia, MD (Referring MD) Medicines:            Monitored Anesthesia Care Complications:        No immediate complications. Procedure:            Pre-Anesthesia Assessment:                       - ASA Grade Assessment: II - A patient with mild                        systemic disease.                       After obtaining informed consent, the endoscope was                        passed under direct vision. Throughout the procedure,                        the patient's blood pressure, pulse, and oxygen                        saturations were monitored continuously. The Endoscope                        was introduced through the mouth, and advanced to the                        third part of duodenum. The upper GI endoscopy was                        accomplished without difficulty. The patient tolerated                        the procedure well. Findings:      LA Grade B (one or more mucosal breaks greater than 5 mm, not extending       between the tops of two mucosal folds) esophagitis with no bleeding was       found. Biopsies were taken with a cold forceps for histology.      A single 4 mm mucosal papule (nodule) with no bleeding and no stigmata       of recent bleeding was found in the cardia. Biopsies were taken with a       cold forceps for histology.      Evidence of a sleeve gastrectomy was found in the greater curvature of       the stomach. This was characterized by healthy appearing mucosa.      Patchy mild inflammation  characterized by congestion (edema) and       erythema was found in the gastric antrum. Biopsies were taken with a  cold forceps for histology from the antrum and the body of the stomach       placed into separate jars. Retroflexion not done due to narrowness of       the gastric vault, however a good view of the vault is obtained. .      The examined duodenum was normal. Biopsies were taken with a cold       forceps for histology.      A small hiatal hernia was found. The Z-line was a variable distance from       incisors; the hiatal hernia was sliding. Impression:           - LA Grade B erosive esophagitis. Biopsied.                       - A single mucosal papule (nodule) found in the                        stomach. Biopsied.                       - A sleeve gastrectomy was found, characterized by                        healthy appearing mucosa.                       - Bile gastritis. Biopsied.                       - Normal examined duodenum. Biopsied.                       - Small hiatal hernia. Recommendation:       - Discharge patient to home.                       - Return to GI clinic in 4 weeks.                       - Use Aciphex (rabeprazole) 20 mg PO BID for 4 weeks.                       - Use Aciphex (rabeprazole) 20 mg PO daily.                       - Use sucralfate tablets 1 gram PO QID for 4 weeks. Procedure Code(s):    --- Professional ---                       [phone], Esophagogastroduodenoscopy, flexible, transoral;                        with biopsy, single or multiple Diagnosis Code(s):    --- Professional ---                       K20.8, Other esophagitis                       K31.89, Other diseases of stomach and duodenum                       Z98.84, Bariatric surgery  status                       K29.60, Other gastritis without bleeding                       K44.9, Diaphragmatic hernia without obstruction or                        gangrene                        R10.13, Epigastric pain CPT copyright 2018 [employer]. All rights reserved. The codes documented in this report are preliminary and upon coder review may  be revised to meet current compliance requirements. Lollie Sails, MD 11/03/2018 8:06:42 AM This report has been signed electronically. Number of Addenda: 0 Note Initiated On: 11/03/2018 7:37 AM      [facility]

## 2018-11-03 NOTE — Anesthesia Postprocedure Evaluation (Signed)
Anesthesia Post Note  Patient: Bethany Flores  Procedure(s) Performed: ESOPHAGOGASTRODUODENOSCOPY (EGD) (N/A )  Patient location during evaluation: PACU Anesthesia Type: General Level of consciousness: awake and alert Pain management: pain level controlled Vital Signs Assessment: post-procedure vital signs reviewed and stable Respiratory status: spontaneous breathing, nonlabored ventilation, respiratory function stable and patient connected to nasal cannula oxygen Cardiovascular status: blood pressure returned to baseline and stable Postop Assessment: no apparent nausea or vomiting Anesthetic complications: no     Last Vitals:  Vitals:   11/03/18 0811 11/03/18 0816  BP:  94/79  Pulse: 78 73  Resp: 16 13  Temp:    SpO2: 99% 97%    Last Pain:  Vitals:   11/03/18 0816  TempSrc:   PainSc: 0-No pain                 Molli Barrows

## 2018-11-03 NOTE — Addendum Note (Signed)
Addendum  created 11/03/18 0850 by Lavone Orn, CRNA   Intraprocedure Flowsheets edited

## 2018-11-03 NOTE — Anesthesia Post-op Follow-up Note (Signed)
Anesthesia QCDR form completed.        

## 2018-11-03 NOTE — Anesthesia Preprocedure Evaluation (Signed)
Anesthesia Evaluation  Patient identified by MRN, date of birth, ID band Patient awake    Reviewed: Allergy & Precautions, H&P , NPO status , Patient's Chart, lab work & pertinent test results, reviewed documented beta blocker date and time   Airway Mallampati: II   Neck ROM: full    Dental  (+) Poor Dentition   Pulmonary neg pulmonary ROS,    Pulmonary exam normal        Cardiovascular Exercise Tolerance: Good negative cardio ROS Normal cardiovascular exam Rhythm:regular Rate:Normal     Neuro/Psych Anxiety Depression  Neuromuscular disease negative psych ROS   GI/Hepatic Neg liver ROS, GERD  Medicated,  Endo/Other  negative endocrine ROS  Renal/GU negative Renal ROS  negative genitourinary   Musculoskeletal   Abdominal   Peds  Hematology  (+) Blood dyscrasia, anemia ,   Anesthesia Other Findings Past Medical History: No date: Anemia No date: Anxiety No date: Arthritis No date: DDD (degenerative disc disease), lumbar No date: Depression No date: Diverticulosis No date: GERD (gastroesophageal reflux disease)     Comment:  OCC-TUMS PRN No date: History of carpal tunnel syndrome No date: Plantar fasciitis Past Surgical History: 05/04/2018: ABLATION ON ENDOMETRIOSIS     Comment:  Procedure: EXCISION OF ENDOMETRIOSIS;  Surgeon: Benjaman Kindler, MD;  Location: [facility] ORS;  Service: Gynecology;; 02/16/2018: BACK SURGERY     Comment:  LOWER LAMINECTOMY 04/26/2015: CHOLECYSTECTOMY; N/A     Comment:  Procedure: LAPAROSCOPIC CHOLECYSTECTOMY WITH               INTRAOPERATIVE CHOLANGIOGRAM;  Surgeon: Molly Maduro,              MD;  Location: [facility] ORS;  Service: General;  Laterality:               N/A; 02/26/2016: COLONOSCOPY WITH PROPOFOL; N/A     Comment:  Procedure: COLONOSCOPY WITH PROPOFOL;  Surgeon: Lollie Sails, MD;  Location: [facility] ENDOSCOPY;  Service:               Endoscopy;   Laterality: N/A; 05/04/2018: CYSTOSCOPY     Comment:  Procedure: CYSTOSCOPY;  Surgeon: Benjaman Kindler, MD;                Location: [facility] ORS;  Service: Gynecology;; 11/15/2016: ERCP; N/A     Comment:  Procedure: ENDOSCOPIC RETROGRADE               CHOLANGIOPANCREATOGRAPHY (ERCP);  Surgeon: Lucilla Lame,               MD;  Location: [facility] ENDOSCOPY;  Service: Endoscopy;                Laterality: N/A;  Inpatient RM [id number] 01/14/2017: ERCP; N/A     Comment:  Procedure: ENDOSCOPIC RETROGRADE               CHOLANGIOPANCREATOGRAPHY (ERCP);  Surgeon: Lucilla Lame,               MD;  Location: [facility] ENDOSCOPY;  Service: Endoscopy;                Laterality: N/A; No date: Laminectomy posterior lumbar  05/04/2018: LAPAROSCOPIC BILATERAL SALPINGECTOMY; Bilateral     Comment:  Procedure: LAPAROSCOPIC BILATERAL SALPINGECTOMY;                Surgeon:  Benjaman Kindler, MD;  Location: [facility] ORS;                Service: Gynecology;  Laterality: Bilateral; No date: LAPAROSCOPIC GASTRIC SLEEVE RESECTION 05/04/2018: LAPAROSCOPIC HYSTERECTOMY; N/A     Comment:  Procedure: HYSTERECTOMY TOTAL LAPAROSCOPIC;  Surgeon:               Benjaman Kindler, MD;  Location: [facility] ORS;  Service:               Gynecology;  Laterality: N/A; No date: MICRODISCECTOMY LUMBAR BMI    Body Mass Index:  29.05 kg/m     Reproductive/Obstetrics negative OB ROS                             Anesthesia Physical Anesthesia Plan  ASA: II  Anesthesia Plan: General   Post-op Pain Management:    Induction:   PONV Risk Score and Plan:   Airway Management Planned:   Additional Equipment:   Intra-op Plan:   Post-operative Plan:   Informed Consent: I have reviewed the patients History and Physical, chart, labs and discussed the procedure including the risks, benefits and alternatives for the proposed anesthesia with the patient or authorized representative who has indicated his/her understanding and  acceptance.   Dental Advisory Given  Plan Discussed with: CRNA  Anesthesia Plan Comments:         Anesthesia Quick Evaluation

## 2018-11-03 NOTE — H&P (Signed)
Outpatient short stay form Pre-procedure 11/03/2018 7:36 AM Lollie Sails MD  Primary Physician: Juluis Pitch, MD  Reason for visit: EGD  History of present illness: Patient is a 52 year old female presenting today with a complaint of epigastric pain and dyspepsia.  She also has a history of iron deficiency anemia.  It is of note that she had a gastric sleeve done about 5 to 6 years ago.  He has a complaint of upper epigastric discomfort this occurs frequently.  He was recently placed on a PPI, Protonix, this has improved her him some but she continues to have episodic epigastric pain and reflux symptoms.  Occasionally takes NSAIDs.  She has been on metoclopramide.    Current Facility-Administered Medications:  .  0.9 %  sodium chloride infusion, , Intravenous, Continuous, Lollie Sails, MD .  0.9 %  sodium chloride infusion, , Intravenous, Continuous, Lollie Sails, MD, Last Rate: 20 mL/hr at 11/03/18 0700, 1,000 mL at 11/03/18 0700  Medications Prior to Admission  Medication Sig Dispense Refill Last Dose  . acetaminophen (TYLENOL) 500 MG tablet Take 1,000 mg by mouth every 6 (six) hours as needed.   Past Week at Unknown time  . Calcium Carbonate-Vitamin D 600-200 MG-UNIT TABS Take 1 tablet by mouth daily with lunch.    Past Week at Unknown time  . docusate sodium (COLACE) 100 MG capsule Take 1 capsule (100 mg total) by mouth 2 (two) times daily. To keep stools soft 30 capsule 0 Past Week at Unknown time  . ibuprofen (ADVIL,MOTRIN) 800 MG tablet Take 1 tablet (800 mg total) by mouth every 8 (eight) hours as needed for moderate pain. 30 tablet 1 Past Week at Unknown time  . metoCLOPramide (REGLAN) 10 MG tablet Take 10 mg by mouth 4 (four) times daily.   11/02/2018 at Unknown time  . Multiple Vitamins-Minerals (MULTIVITAMIN ADULT) TABS Take 1 tablet by mouth daily.   Past Week at Unknown time  . Omega-3 Fatty Acids (FISH OIL) 1000 MG CAPS Take by mouth.   Past Week at Unknown  time  . oxycodone (OXY-IR) 5 MG capsule Take 1 capsule (5 mg total) by mouth every 6 (six) hours as needed for pain. 15 capsule 0 Past Week at Unknown time  . sertraline (ZOLOFT) 50 MG tablet Take 50 mg by mouth every morning.    11/02/2018 at Unknown time  . simethicone (MYLICON) 80 MG chewable tablet Chew 80 mg by mouth every 6 (six) hours as needed for flatulence.   Past Week at Unknown time  . gabapentin (NEURONTIN) 800 MG tablet Take 1 tablet (800 mg total) by mouth at bedtime for 14 days. Take nightly for 3 days, then up to 14 days as needed 14 tablet 0      No Known Allergies   Past Medical History:  Diagnosis Date  . Anemia   . Anxiety   . Arthritis   . DDD (degenerative disc disease), lumbar   . Depression   . Diverticulosis   . GERD (gastroesophageal reflux disease)    OCC-TUMS PRN  . History of carpal tunnel syndrome   . Plantar fasciitis     Review of systems:      Physical Exam    Heart and lungs: Regular rate and rhythm without rub or gallop, lungs are bilaterally clear.    HEENT: Normocephalic atraumatic eyes are anicteric    Other:    Pertinant exam for procedure: Soft nontender nondistended bowel sounds positive normoactive  Planned proceedures: EGD and indicated procedures. I have discussed the risks benefits and complications of procedures to include not limited to bleeding, infection, perforation and the risk of sedation and the patient wishes to proceed.    Lollie Sails, MD Gastroenterology 11/03/2018  7:36 AM

## 2018-11-04 ENCOUNTER — Encounter: Payer: Self-pay | Admitting: Gastroenterology

## 2018-11-06 LAB — SURGICAL PATHOLOGY

## 2019-01-11 ENCOUNTER — Other Ambulatory Visit: Payer: Self-pay | Admitting: Obstetrics and Gynecology

## 2019-01-11 DIAGNOSIS — Z1231 Encounter for screening mammogram for malignant neoplasm of breast: Secondary | ICD-10-CM

## 2019-01-28 ENCOUNTER — Ambulatory Visit
Admission: RE | Admit: 2019-01-28 | Discharge: 2019-01-28 | Disposition: A | Discharge status: Home / Self Care | Payer: BLUE CROSS/BLUE SHIELD | Source: Ambulatory Visit | Attending: Obstetrics and Gynecology | Admitting: Obstetrics and Gynecology

## 2019-01-28 ENCOUNTER — Other Ambulatory Visit: Payer: Self-pay

## 2019-01-28 DIAGNOSIS — Z1231 Encounter for screening mammogram for malignant neoplasm of breast: Secondary | ICD-10-CM | POA: Insufficient documentation

## 2019-02-15 ENCOUNTER — Encounter: Payer: Self-pay | Admitting: Oncology

## 2019-02-23 ENCOUNTER — Other Ambulatory Visit: Payer: BLUE CROSS/BLUE SHIELD

## 2019-02-23 ENCOUNTER — Ambulatory Visit: Payer: BLUE CROSS/BLUE SHIELD

## 2019-02-23 ENCOUNTER — Ambulatory Visit: Payer: BLUE CROSS/BLUE SHIELD | Admitting: Oncology

## 2019-04-22 ENCOUNTER — Inpatient Hospital Stay: Payer: BC Managed Care – PPO

## 2019-04-23 ENCOUNTER — Other Ambulatory Visit: Payer: Self-pay

## 2019-04-23 ENCOUNTER — Inpatient Hospital Stay: Payer: BC Managed Care – PPO | Attending: Oncology

## 2019-04-23 ENCOUNTER — Encounter: Payer: Self-pay | Admitting: Oncology

## 2019-04-23 DIAGNOSIS — Z79899 Other long term (current) drug therapy: Secondary | ICD-10-CM | POA: Insufficient documentation

## 2019-04-23 DIAGNOSIS — D509 Iron deficiency anemia, unspecified: Secondary | ICD-10-CM | POA: Diagnosis not present

## 2019-04-23 DIAGNOSIS — G5600 Carpal tunnel syndrome, unspecified upper limb: Secondary | ICD-10-CM | POA: Diagnosis not present

## 2019-04-23 DIAGNOSIS — F329 Major depressive disorder, single episode, unspecified: Secondary | ICD-10-CM | POA: Insufficient documentation

## 2019-04-23 DIAGNOSIS — Z791 Long term (current) use of non-steroidal anti-inflammatories (NSAID): Secondary | ICD-10-CM | POA: Diagnosis not present

## 2019-04-23 DIAGNOSIS — F419 Anxiety disorder, unspecified: Secondary | ICD-10-CM | POA: Diagnosis not present

## 2019-04-23 DIAGNOSIS — Z803 Family history of malignant neoplasm of breast: Secondary | ICD-10-CM | POA: Insufficient documentation

## 2019-04-23 LAB — CBC WITH DIFFERENTIAL/PLATELET
Abs Immature Granulocytes: 0.01 10*3/uL (ref 0.00–0.07)
Basophils Absolute: 0 10*3/uL (ref 0.0–0.1)
Basophils Relative: 1 %
Eosinophils Absolute: 0.2 10*3/uL (ref 0.0–0.5)
Eosinophils Relative: 2 %
HCT: 38.4 % (ref 36.0–46.0)
Hemoglobin: 12.9 g/dL (ref 12.0–15.0)
Immature Granulocytes: 0 %
Lymphocytes Relative: 44 %
Lymphs Abs: 3 10*3/uL (ref 0.7–4.0)
MCH: 30.4 pg (ref 26.0–34.0)
MCHC: 33.6 g/dL (ref 30.0–36.0)
MCV: 90.4 fL (ref 80.0–100.0)
Monocytes Absolute: 0.5 10*3/uL (ref 0.1–1.0)
Monocytes Relative: 7 %
Neutro Abs: 3.2 10*3/uL (ref 1.7–7.7)
Neutrophils Relative %: 46 %
Platelets: 272 10*3/uL (ref 150–400)
RBC: 4.25 MIL/uL (ref 3.87–5.11)
RDW: 11.9 % (ref 11.5–15.5)
WBC: 6.8 10*3/uL (ref 4.0–10.5)
nRBC: 0 % (ref 0.0–0.2)

## 2019-04-23 LAB — IRON AND TIBC
Iron: 79 ug/dL (ref 28–170)
Saturation Ratios: 25 % (ref 10.4–31.8)
TIBC: 316 ug/dL (ref 250–450)
UIBC: 237 ug/dL

## 2019-04-23 LAB — FERRITIN: Ferritin: 54 ng/mL (ref 11–307)

## 2019-04-27 ENCOUNTER — Inpatient Hospital Stay: Payer: BC Managed Care – PPO | Admitting: Oncology

## 2019-04-27 ENCOUNTER — Inpatient Hospital Stay: Payer: BC Managed Care – PPO

## 2019-04-27 ENCOUNTER — Inpatient Hospital Stay (HOSPITAL_BASED_OUTPATIENT_CLINIC_OR_DEPARTMENT_OTHER): Payer: BC Managed Care – PPO | Admitting: Oncology

## 2019-04-27 DIAGNOSIS — Z79899 Other long term (current) drug therapy: Secondary | ICD-10-CM

## 2019-04-27 DIAGNOSIS — D509 Iron deficiency anemia, unspecified: Secondary | ICD-10-CM | POA: Diagnosis not present

## 2019-04-28 NOTE — Progress Notes (Signed)
[place]  [phone] [phone]  ID: Bethany Flores OB: 1966/03/25  MR#: [id number]  SEG#:[id number]  Patient Care Team: Juluis Pitch, MD as PCP - General (Family Medicine)  I connected with Bethany Flores on 04/28/19 at 10:45 AM EDT by video enabled telemedicine visit and verified that I am speaking with the correct person using two identifiers.   I discussed the limitations, risks, security and privacy concerns of performing an evaluation and management service by telemedicine and the availability of in-person appointments. I also discussed with the patient that there may be a patient responsible charge related to this service. The patient expressed understanding and agreed to proceed.   Other persons participating in the visit and their role in the encounter: Patient, MD  Patient's location: Home Provider's location: Clinic  CHIEF COMPLAINT: Iron deficiency anemia.  INTERVAL HISTORY: Patient agreed to video enabled telemedicine visit for further evaluation and discussion of her laboratory work.  She currently feels well and is asymptomatic.  She does not complain of weakness and fatigue today.  She denies any pain. She has no neurologic complaints. She denies any recent fevers or illnesses. She has a good appetite and denies weight loss.  She has no chest pain, shortness of breath, cough, or hemoptysis.  She denies any nausea, vomiting, constipation, or diarrhea.  She denies any melena or hematochezia.  She has no urinary complaints.  Patient feels at her baseline offers no specific complaints today.  REVIEW OF SYSTEMS:   Review of Systems  Constitutional: Negative.  Negative for fever, malaise/fatigue and weight loss.  Respiratory: Negative.  Negative for shortness of breath.   Cardiovascular: Negative.  Negative for chest pain and leg swelling.  Gastrointestinal: Negative.  Negative for abdominal pain, blood in stool and melena.   Genitourinary: Negative.  Negative for hematuria.  Musculoskeletal: Negative.  Negative for back pain and neck pain.  Skin: Negative.  Negative for rash.  Neurological: Negative.  Negative for sensory change, focal weakness and weakness.  Psychiatric/Behavioral: Negative.  The patient is not nervous/anxious.     As per HPI. Otherwise, a complete review of systems is negative.  PAST MEDICAL HISTORY: Past Medical History:  Diagnosis Date  . Anemia   . Anxiety   . Arthritis   . DDD (degenerative disc disease), lumbar   . Depression   . Diverticulosis   . GERD (gastroesophageal reflux disease)    OCC-TUMS PRN  . History of carpal tunnel syndrome   . Plantar fasciitis     PAST SURGICAL HISTORY: Past Surgical History:  Procedure Laterality Date  . ABLATION ON ENDOMETRIOSIS  05/04/2018   Procedure: EXCISION OF ENDOMETRIOSIS;  Surgeon: Benjaman Kindler, MD;  Location: [facility] ORS;  Service: Gynecology;;  . BACK SURGERY  02/16/2018   LOWER LAMINECTOMY  . CHOLECYSTECTOMY N/A 04/26/2015   Procedure: LAPAROSCOPIC CHOLECYSTECTOMY WITH INTRAOPERATIVE CHOLANGIOGRAM;  Surgeon: Molly Maduro, MD;  Location: [facility] ORS;  Service: General;  Laterality: N/A;  . COLONOSCOPY WITH PROPOFOL N/A 02/26/2016   Procedure: COLONOSCOPY WITH PROPOFOL;  Surgeon: Lollie Sails, MD;  Location: [facility] ENDOSCOPY;  Service: Endoscopy;  Laterality: N/A;  . CYSTOSCOPY  05/04/2018   Procedure: CYSTOSCOPY;  Surgeon: Benjaman Kindler, MD;  Location: [facility] ORS;  Service: Gynecology;;  . ERCP N/A 11/15/2016   Procedure: ENDOSCOPIC RETROGRADE CHOLANGIOPANCREATOGRAPHY (ERCP);  Surgeon: Lucilla Lame, MD;  Location: [facility] ENDOSCOPY;  Service: Endoscopy;  Laterality: N/A;  Inpatient RM [id number]  . ERCP N/A 01/14/2017   Procedure: ENDOSCOPIC RETROGRADE CHOLANGIOPANCREATOGRAPHY (  ERCP);  Surgeon: Lucilla Lame, MD;  Location[facility] ENDOSCOPY;  Service: Endoscopy;  Laterality: N/A;  . ESOPHAGOGASTRODUODENOSCOPY N/A 11/03/2018   Procedure:  ESOPHAGOGASTRODUODENOSCOPY (EGD);  Surgeon: Lollie Sails, MD;  Location: [facility] ENDOSCOPY;  Service: Endoscopy;  Laterality: N/A;  . Laminectomy posterior lumbar     . LAPAROSCOPIC BILATERAL SALPINGECTOMY Bilateral 05/04/2018   Procedure: LAPAROSCOPIC BILATERAL SALPINGECTOMY;  Surgeon: Benjaman Kindler, MD;  Location: [facility] ORS;  Service: Gynecology;  Laterality: Bilateral;  . LAPAROSCOPIC GASTRIC SLEEVE RESECTION    . LAPAROSCOPIC HYSTERECTOMY N/A 05/04/2018   Procedure: HYSTERECTOMY TOTAL LAPAROSCOPIC;  Surgeon: Benjaman Kindler, MD;  Location: [facility] ORS;  Service: Gynecology;  Laterality: N/A;  . MICRODISCECTOMY LUMBAR      FAMILY HISTORY Family History  Problem Relation Age of Onset  . Diabetes Paternal Grandmother   . Diabetes Paternal Grandfather   . Breast cancer Neg Hx        ADVANCED DIRECTIVES:    HEALTH MAINTENANCE: Social History   Tobacco Use  . Smoking status: Never Smoker  . Smokeless tobacco: Never Used  Substance Use Topics  . Alcohol use: Yes    Alcohol/week: 0.0 standard drinks    Comment: occasionally  . Drug use: No     Colonoscopy:  PAP:  Bone density:  Lipid panel:  No Known Allergies  Current Outpatient Medications  Medication Sig Dispense Refill  . acetaminophen (TYLENOL) 500 MG tablet Take 1,000 mg by mouth every 6 (six) hours as needed.    . Calcium Carbonate-Vitamin D 600-200 MG-UNIT TABS Take 1 tablet by mouth daily with lunch.     . docusate sodium (COLACE) 100 MG capsule Take 1 capsule (100 mg total) by mouth 2 (two) times daily. To keep stools soft 30 capsule 0  . gabapentin (NEURONTIN) 800 MG tablet Take 1 tablet (800 mg total) by mouth at bedtime for 14 days. Take nightly for 3 days, then up to 14 days as needed 14 tablet 0  . ibuprofen (ADVIL,MOTRIN) 800 MG tablet Take 1 tablet (800 mg total) by mouth every 8 (eight) hours as needed for moderate pain. 30 tablet 1  . metoCLOPramide (REGLAN) 10 MG tablet Take 10 mg by mouth 4 (four)  times daily.    . Multiple Vitamins-Minerals (MULTIVITAMIN ADULT) TABS Take 1 tablet by mouth daily.    . Omega-3 Fatty Acids (FISH OIL) 1000 MG CAPS Take by mouth.    Marland Kitchen oxycodone (OXY-IR) 5 MG capsule Take 1 capsule (5 mg total) by mouth every 6 (six) hours as needed for pain. 15 capsule 0  . sertraline (ZOLOFT) 50 MG tablet Take 50 mg by mouth every morning.     . simethicone (MYLICON) 80 MG chewable tablet Chew 80 mg by mouth every 6 (six) hours as needed for flatulence.     No current facility-administered medications for this visit.     OBJECTIVE: There were no vitals filed for this visit.   There is no height or weight on file to calculate BMI.    ECOG FS:0 - Asymptomatic  General: Well-developed, well-nourished, no acute distress. HEENT: Normocephalic. Neuro: Alert, answering all questions appropriately. Cranial nerves grossly intact. Skin: No rashes or petechiae noted. Psych: Normal affect.  LAB RESULTS:  Lab Results  Component Value Date   NA 137 04/29/2018   K 3.9 04/29/2018   CL 105 04/29/2018   CO2 25 04/29/2018   GLUCOSE 84 04/29/2018   BUN 13 04/29/2018   CREATININE 0.73 04/29/2018   CALCIUM 8.5 (L) 04/29/2018  PROT 6.9 11/16/2016   ALBUMIN 3.3 (L) 11/16/2016   AST 115 (H) 11/16/2016   ALT 164 (H) 11/16/2016   ALKPHOS 301 (H) 11/16/2016   BILITOT 3.2 (H) 11/16/2016   GFRNONAA >60 04/29/2018   GFRAA >60 04/29/2018    Lab Results  Component Value Date   WBC 6.8 04/23/2019   NEUTROABS 3.2 04/23/2019   HGB 12.9 04/23/2019   HCT 38.4 04/23/2019   MCV 90.4 04/23/2019   PLT 272 04/23/2019   Lab Results  Component Value Date   IRON 79 04/23/2019   TIBC 316 04/23/2019   IRONPCTSAT 25 04/23/2019   Lab Results  Component Value Date   FERRITIN 54 04/23/2019      STUDIES: No results found.  ASSESSMENT: Iron deficiency anemia.  PLAN:    1. Iron deficiency anemia: Patient's hemoglobin and iron stores are now within normal limits.  Previously, the  remainder of her laboratory work was either negative or within normal limits.  Patient does not require additional IV Feraheme today.  She last received treatment on August 28, 2018.  After lengthy discussion with the patient, is agreed upon that no further follow-up is necessary.  Please refer patient back if there are any questions or concerns.   2. Positive ANA: Repeat testing on September 02, 2017 is reported as negative.  No further intervention is needed.  I provided 15 minutes of face-to-face video visit time during this encounter, and > 50% was spent counseling as documented under my assessment & plan.    Patient expressed understanding and was in agreement with this plan. She also understands that She can call clinic at any time with any questions, concerns, or complaints.    Lloyd Huger, MD   04/28/2019 6:43 AM

## 2019-08-09 DIAGNOSIS — C4492 Squamous cell carcinoma of skin, unspecified: Secondary | ICD-10-CM

## 2019-08-09 HISTORY — DX: Squamous cell carcinoma of skin, unspecified: C44.92

## 2020-03-01 ENCOUNTER — Other Ambulatory Visit: Payer: Self-pay | Admitting: Chiropractic Medicine

## 2020-03-01 ENCOUNTER — Ambulatory Visit
Admission: RE | Admit: 2020-03-01 | Discharge: 2020-03-01 | Disposition: A | Discharge status: Home / Self Care | Payer: BC Managed Care – PPO | Source: Ambulatory Visit | Attending: Chiropractic Medicine | Admitting: Chiropractic Medicine

## 2020-03-01 ENCOUNTER — Other Ambulatory Visit: Payer: Self-pay

## 2020-03-01 DIAGNOSIS — T1490XA Injury, unspecified, initial encounter: Secondary | ICD-10-CM

## 2020-08-08 ENCOUNTER — Ambulatory Visit (INDEPENDENT_AMBULATORY_CARE_PROVIDER_SITE_OTHER): Payer: BC Managed Care – PPO | Admitting: Dermatology

## 2020-08-08 ENCOUNTER — Other Ambulatory Visit: Payer: Self-pay

## 2020-08-08 ENCOUNTER — Encounter: Payer: Self-pay | Admitting: Dermatology

## 2020-08-08 DIAGNOSIS — L578 Other skin changes due to chronic exposure to nonionizing radiation: Secondary | ICD-10-CM

## 2020-08-08 DIAGNOSIS — D225 Melanocytic nevi of trunk: Secondary | ICD-10-CM

## 2020-08-08 DIAGNOSIS — Z86007 Personal history of in-situ neoplasm of skin: Secondary | ICD-10-CM | POA: Diagnosis not present

## 2020-08-08 DIAGNOSIS — L570 Actinic keratosis: Secondary | ICD-10-CM

## 2020-08-08 DIAGNOSIS — Z1283 Encounter for screening for malignant neoplasm of skin: Secondary | ICD-10-CM | POA: Diagnosis not present

## 2020-08-08 DIAGNOSIS — D1800 Hemangioma unspecified site: Secondary | ICD-10-CM

## 2020-08-08 DIAGNOSIS — D229 Melanocytic nevi, unspecified: Secondary | ICD-10-CM | POA: Diagnosis not present

## 2020-08-08 DIAGNOSIS — L821 Other seborrheic keratosis: Secondary | ICD-10-CM

## 2020-08-08 DIAGNOSIS — Q825 Congenital non-neoplastic nevus: Secondary | ICD-10-CM

## 2020-08-08 DIAGNOSIS — L814 Other melanin hyperpigmentation: Secondary | ICD-10-CM

## 2020-08-08 DIAGNOSIS — D2271 Melanocytic nevi of right lower limb, including hip: Secondary | ICD-10-CM

## 2020-08-08 NOTE — Patient Instructions (Addendum)

## 2020-08-08 NOTE — Progress Notes (Signed)
Follow-Up Visit   Subjective  Bethany Flores is a 54 y.o. female who presents for the following: TBSE.  Patient presents today for full body skin exam and skin cancer screening. 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  Objective  Well appearing patient in no apparent distress; mood and affect are within normal limits.  A full examination was performed including scalp, head, eyes, ears, nose, lips, neck, chest, axillae, abdomen, back, buttocks, bilateral upper extremities, bilateral lower extremities, hands, feet, fingers, toes, fingernails, and toenails. All findings within normal limits unless otherwise noted below.  Objective  Mid Back, Right upper thigh at ingunial crease: 4 x 3 mm med dark brown papule  Tan-brown and/or pink-flesh-colored symmetric macules and papules. Mid Back  Images        Objective  Left Flank at braline: 1 cm speckled brown macule  Images      Objective  Left Infraocular, Right low Cheek: Erythematous thin papules/macules with gritty scale.    Assessment & Plan  Nevus (2) Right upper thigh at ingunial crease; Mid Back  Benign-appearing.  Observation.  Call clinic for new or changing moles.  Recommend daily use of broad spectrum spf 30+ sunscreen to sun-exposed areas.    Congenital nevus Left Flank at [place]  Benign-appearing.  Observation.  Call clinic for new or changing moles.  Recommend daily use of broad spectrum spf 30+ sunscreen to sun-exposed areas.    AK (actinic keratosis) (2) Left Infraocular; Right low Cheek  Cryotherapy today Prior to procedure, discussed risks of blister formation, small wound, skin dyspigmentation, or rare scar following cryotherapy.       Destruction of lesion - Left Infraocular, Right low Cheek  Destruction method: cryotherapy   Informed consent: discussed and consent obtained   Lesion destroyed using liquid nitrogen: Yes   Region frozen until ice ball extended beyond lesion: Yes   Outcome: patient tolerated procedure well with no complications   Post-procedure details: wound care instructions given     Lentigines - Scattered tan macules - Discussed due to sun exposure - Benign, observe - Call for any changes  Seborrheic Keratoses - Stuck-on, waxy, tan-brown papules and plaques  - Discussed benign etiology and prognosis. - Observe - Call for any changes  Melanocytic Nevi - Tan-brown and/or pink-flesh-colored symmetric macules and papules - Benign appearing on exam today - Observation - Call clinic for new or changing moles - Recommend daily use of broad spectrum spf 30+ sunscreen to sun-exposed areas.   Hemangiomas - Red papules - Discussed benign nature - Observe - Call for any changes  Actinic Damage - diffuse scaly erythematous macules with underlying dyspigmentation - Recommend daily broad spectrum sunscreen SPF 30+ to sun-exposed areas, reapply every 2 hours as needed.  - Call for new or changing lesions.  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  - Call if any new or changing lesions are noted between office visits   Skin cancer screening performed today.  Return in about 1 year (around 08/08/2021) for TBSE.  I, Donzetta Kohut, CMA, am acting as scribe for Brendolyn Patty, MD .  Documentation: I have reviewed the above documentation for accuracy and completeness, and I agree with the above.  Brendolyn Patty MD

## 2021-04-24 ENCOUNTER — Other Ambulatory Visit: Payer: Self-pay | Admitting: Family Medicine

## 2021-04-24 DIAGNOSIS — Z1231 Encounter for screening mammogram for malignant neoplasm of breast: Secondary | ICD-10-CM

## 2021-05-03 ENCOUNTER — Other Ambulatory Visit: Payer: Self-pay

## 2021-05-03 ENCOUNTER — Ambulatory Visit
Admission: RE | Admit: 2021-05-03 | Discharge: 2021-05-03 | Disposition: A | Discharge status: Home / Self Care | Payer: BC Managed Care – PPO | Source: Ambulatory Visit | Attending: Family Medicine | Admitting: Family Medicine

## 2021-05-03 DIAGNOSIS — Z1231 Encounter for screening mammogram for malignant neoplasm of breast: Secondary | ICD-10-CM | POA: Insufficient documentation

## 2021-08-20 ENCOUNTER — Other Ambulatory Visit: Payer: Self-pay

## 2021-08-20 ENCOUNTER — Ambulatory Visit: Payer: BC Managed Care – PPO | Admitting: Dermatology

## 2021-08-20 DIAGNOSIS — L814 Other melanin hyperpigmentation: Secondary | ICD-10-CM

## 2021-08-20 DIAGNOSIS — L821 Other seborrheic keratosis: Secondary | ICD-10-CM

## 2021-08-20 DIAGNOSIS — L570 Actinic keratosis: Secondary | ICD-10-CM | POA: Diagnosis not present

## 2021-08-20 DIAGNOSIS — Q825 Congenital non-neoplastic nevus: Secondary | ICD-10-CM

## 2021-08-20 DIAGNOSIS — Z86008 Personal history of in-situ neoplasm of other site: Secondary | ICD-10-CM

## 2021-08-20 DIAGNOSIS — D229 Melanocytic nevi, unspecified: Secondary | ICD-10-CM | POA: Diagnosis not present

## 2021-08-20 DIAGNOSIS — D1801 Hemangioma of skin and subcutaneous tissue: Secondary | ICD-10-CM

## 2021-08-20 DIAGNOSIS — L719 Rosacea, unspecified: Secondary | ICD-10-CM

## 2021-08-20 DIAGNOSIS — Z86018 Personal history of other benign neoplasm: Secondary | ICD-10-CM | POA: Diagnosis not present

## 2021-08-20 DIAGNOSIS — Z1283 Encounter for screening for malignant neoplasm of skin: Secondary | ICD-10-CM | POA: Diagnosis not present

## 2021-08-20 MED ORDER — METRONIDAZOLE 0.75 % EX CREA: TOPICAL_CREAM | CUTANEOUS | 3 refills | Status: DC

## 2021-08-20 NOTE — Patient Instructions (Addendum)
Cryotherapy Aftercare  Wash gently with soap and water everyday.   Apply Vaseline and Band-Aid daily until healed.    Rosacea  What is rosacea? Rosacea (say: ro-zay-sha) is a common skin disease that usually begins as a trend of flushing or blushing easily.  As rosacea progresses, a persistent redness in the center of the face will develop and may gradually spread beyond the nose and cheeks to the forehead and chin.  In some cases, the ears, chest, and back could be affected.  Rosacea may appear as tiny blood vessels or small red bumps that occur in crops.  Frequently they can contain pus, and are called "pustules".  If the bumps do not contain pus, they are referred to as "papules".  Rarely, in prolonged, untreated cases of rosacea, the oil glands of the nose and cheeks may become permanently enlarged.  This is called rhinophyma, and is seen more frequently in men.  Signs and Risks In its beginning stages, rosacea tends to come and go, which makes it difficult to recognize.  It can start as intermittent flushing of the face.  Eventually, blood vessels may become permanently visible.  Pustules and papules can appear, but can be mistaken for adult acne.  People of all races, ages, genders and ethnic groups are at risk of developing rosacea.  However, it is more common in women (especially around menopause) and adults with fair skin between the ages of 30 and 50.  Treatment Dermatologists typically recommend a combination of treatments to effectively manage rosacea.  Treatment can improve symptoms and may stop the progression of the rosacea.  Treatment may involve both topical and oral medications.  The tetracycline antibiotics are often used for their anti-inflammatory effect; however, because of the possibility of developing antibiotic resistance, they should not be used long term at full dose.  For dilated blood vessels the options include electrodessication (uses electric current through a small  needle), laser treatment, and cosmetics to hide the redness.   With all forms of treatment, improvement is a slow process, and patients may not see any results for the first 3-4 weeks.  It is very important to avoid the sun and other triggers.  Patients must wear sunscreen daily.  Skin Care Instructions: Cleanse the skin with a mild soap such as CeraVe cleanser, Cetaphil cleanser, or Dove soap once or twice daily as needed. Moisturize with Eucerin Redness Relief Daily Perfecting Lotion (has a subtle green tint), CeraVe Moisturizing Cream, or Oil of Olay Daily Moisturizer with sunscreen every morning and/or night as recommended. Makeup should be "non-comedogenic" (won't clog pores) and be labeled "for sensitive skin". Good choices for cosmetics are: Neutrogena, Almay, and Physician's Formula.  Any product with a green tint tends to offset a red complexion. If your eyes are dry and irritated, use artificial tears 2-3 times per day and cleanse the eyelids daily with baby shampoo.  Have your eyes examined at least every 2 years.  Be sure to tell your eye doctor that you have rosacea. Alcoholic beverages tend to cause flushing of the skin, and may make rosacea worse. Always wear sunscreen, protect your skin from extreme hot and cold temperatures, and avoid spicy foods, hot drinks, and mechanical irritation such as rubbing, scrubbing, or massaging the face.  Avoid harsh skin cleansers, cleansing masks, astringents, and exfoliation. If a particular product burns or makes your face feel tight, then it is likely to flare your rosacea. If you are having difficulty finding a sunscreen that you can   tolerate, you may try switching to a chemical-free sunscreen.  These are ones whose active ingredient is zinc oxide or titanium dioxide only.  They should also be fragrance free, non-comedogenic, and labeled for sensitive skin. Rosacea triggers may vary from person to person.  There are a variety of foods that have been  reported to trigger rosacea.  Some patients find that keeping a diary of what they were doing when they flared helps them avoid triggers.  If you have any questions or concerns for your doctor, please call our main line at [phone] and press option 4 to reach your doctor's medical assistant. If no one answers, please leave a voicemail as directed and we will return your call as soon as possible. Messages left after 4 pm will be answered the following business day.   You may also send us a message via MyChart. We typically respond to MyChart messages within 1-2 business days.  For prescription refills, please ask your pharmacy to contact our office. Our fax number is [phone].  If you have an urgent issue when the clinic is closed that cannot wait until the next business day, you can page your doctor at the number below.    Please note that while we do our best to be available for urgent issues outside of office hours, we are not available 24/7.   If you have an urgent issue and are unable to reach us, you may choose to seek medical care at your doctor's office, retail clinic, urgent care center, or emergency room.  If you have a medical emergency, please immediately call 911 or go to the emergency department.  Pager Numbers  - Dr. Kowalski: [phone]  - Dr. Moye: [phone]  - Dr. Stewart: [phone]  In the event of inclement weather, please call our main line at [phone] for an update on the status of any delays or closures.  Dermatology Medication Tips: Please keep the boxes that topical medications come in in order to help keep track of the instructions about where and how to use these. Pharmacies typically print the medication instructions only on the boxes and not directly on the medication tubes.   If your medication is too expensive, please contact our office at [phone] option 4 or send us a message through MyChart.   We are unable to tell what your  co-pay for medications will be in advance as this is different depending on your insurance coverage. However, we may be able to find a substitute medication at lower cost or fill out paperwork to get insurance to cover a needed medication.   If a prior authorization is required to get your medication covered by your insurance company, please allow us 1-2 business days to complete this process.  Drug prices often vary depending on where the prescription is filled and some pharmacies may offer cheaper prices.  The website www.goodrx.com contains coupons for medications through different pharmacies. The prices here do not account for what the cost may be with help from insurance (it may be cheaper with your insurance), but the website can give you the price if you did not use any insurance.  - You can print the associated coupon and take it with your prescription to the pharmacy.  - You may also stop by our office during regular business hours and pick up a GoodRx coupon card.  - If you need your prescription sent electronically to a different pharmacy, notify our office through [facility] MyChart or by phone   at [phone] option 4.  

## 2021-08-20 NOTE — Progress Notes (Signed)
Follow-Up Visit   Subjective  Bethany Flores is a 55 y.o. female who presents for the following: Annual Exam (Patient presents for TBSE. She has a history of SCC in situ of the right shoulder (2020) and history of dysplastic nevus of the left back (2011). No spots of concern today. ).  The following portions of the chart were reviewed this encounter and updated as appropriate:       Review of Systems:  No other skin or systemic complaints except as noted in HPI or Assessment and Plan.  Objective  Well appearing patient in no apparent distress; mood and affect are within normal limits.  A full examination was performed including scalp, head, eyes, ears, nose, lips, neck, chest, axillae, abdomen, back, buttocks, bilateral upper extremities, bilateral lower extremities, hands, feet, fingers, toes, fingernails, and toenails. All findings within normal limits unless otherwise noted below.  L spinal mid upper back, R upper thigh at inguinal crease Right upper thigh at ingunial crease: 4 x 3 mm med dark brown papule   L spinal mid upper back: 55mm speckled brown macule  Photos compared, no changes  Left Flank at [place] 1 cm speckled brown macule  Photo compared, no changes    R mid sternum x 1, L mid back x 1, L upper back x 2, , R shoulder sup edge of scar x 1, R upper knee x 1 (6) Erythematous thin papules/macules with gritty scale.   face Mild erythema of the medial cheeks.    Assessment & Plan  Skin cancer screening performed today.  Actinic Damage - chronic, secondary to cumulative UV radiation exposure/sun exposure over time - diffuse scaly erythematous macules with underlying dyspigmentation - Recommend daily broad spectrum sunscreen SPF 30+ to sun-exposed areas, reapply every 2 hours as needed.  - Recommend staying in the shade or wearing long sleeves, sun glasses (UVA+UVB protection) and wide brim hats (4-inch brim around the entire circumference of the hat). -  Call for new or changing lesions.  History of Squamous Cell Carcinoma in Situ of the Skin - No evidence of recurrence today of the right shoulder - Recommend regular full body skin exams - Recommend daily broad spectrum sunscreen SPF 30+ to sun-exposed areas, reapply every 2 hours as needed.  - Call if any new or changing lesions are noted between office visits  Seborrheic Keratoses - Stuck-on, waxy, tan-brown papules and/or plaques  - Benign-appearing - Discussed benign etiology and prognosis. - Observe - Call for any changes  Melanocytic Nevi - Tan-brown and/or pink-flesh-colored symmetric macules and papules - Benign appearing on exam today - Observation - Call clinic for new or changing moles - Recommend daily use of broad spectrum spf 30+ sunscreen to sun-exposed areas.   Hemangiomas - Red papules - Discussed benign nature - Observe - Call for any changes  Lentigines - Scattered tan macules - Due to sun exposure - Benign-appering, observe - Recommend daily broad spectrum sunscreen SPF 30+ to sun-exposed areas, reapply every 2 hours as needed. - Call for any changes  Nevus L spinal mid upper back, R upper thigh at inguinal crease  Benign-appearing.  Stable. Observation.  Call clinic for new or changing moles.  Recommend daily use of broad spectrum spf 30+ sunscreen to sun-exposed areas.   Congenital non-neoplastic nevus Left Flank at [place].  Stable. Observation.  Call clinic for new or changing moles.  Recommend daily use of broad spectrum spf 30+ sunscreen to sun-exposed areas.  AK (actinic keratosis) (6) R mid sternum x 1, L mid back x 1, L upper back x 2, , R shoulder sup edge of scar x 1, R upper knee x 1  vs ISK (L mid back, L upper back). Recheck R shoulder on f/u.  Actinic keratoses are precancerous spots that appear secondary to cumulative UV radiation exposure/sun exposure over time. They are chronic with expected duration over 1  year. A portion of actinic keratoses will progress to squamous cell carcinoma of the skin. It is not possible to reliably predict which spots will progress to skin cancer and so treatment is recommended to prevent development of skin cancer.  Recommend daily broad spectrum sunscreen SPF 30+ to sun-exposed areas, reapply every 2 hours as needed.  Recommend staying in the shade or wearing long sleeves, sun glasses (UVA+UVB protection) and wide brim hats (4-inch brim around the entire circumference of the hat). Call for new or changing lesions.  Destruction of lesion - R mid sternum x 1, L mid back x 1, L upper back x 2, , R shoulder sup edge of scar x 1, R upper knee x 1  Destruction method: cryotherapy   Informed consent: discussed and consent obtained   Lesion destroyed using liquid nitrogen: Yes   Region frozen until ice ball extended beyond lesion: Yes   Outcome: patient tolerated procedure well with no complications   Post-procedure details: wound care instructions given   Additional details:  Prior to procedure, discussed risks of blister formation, small wound, skin dyspigmentation, or rare scar following cryotherapy. Recommend Vaseline ointment to treated areas while healing.   Rosacea face  Rosacea is a chronic progressive skin condition usually affecting the face of adults, causing redness and/or acne bumps. It is treatable but not curable. It sometimes affects the eyes (ocular rosacea) as well. It may respond to topical and/or systemic medication and can flare with stress, sun exposure, alcohol, exercise and some foods.  Daily application of broad spectrum spf 30+ sunscreen to face is recommended to reduce flares.  Start metronidazole 0.75% cream Apply to mid face qd/bid dsp 45g 3Rf.  metroNIDAZOLE (METROCREAM) 0.75 % cream - face Apply to mid face 1-2 times a day for rosacea. History of Dysplastic Nevus - No evidence of recurrence today of the left back - Recommend regular full  body skin exams - Recommend daily broad spectrum sunscreen SPF 30+ to sun-exposed areas, reapply every 2 hours as needed.  - Call if any new or changing lesions are noted between office visits  Return in about 1 year (around 08/20/2022) for recheck right shoulder, TBSE.  Documentation: I have reviewed the above documentation for accuracy and completeness, and I agree with the above.  Brendolyn Patty MD

## 2021-10-20 IMAGING — MG MM DIGITAL SCREENING BILAT W/ TOMO AND CAD
8 series · 8 of 24 positions shown · non-contrast
Comparison: Previous exam(s).

CLINICAL DATA: Screening.

EXAM:
DIGITAL SCREENING BILATERAL MAMMOGRAM WITH TOMOSYNTHESIS AND CAD
TECHNIQUE: Bilateral screening digital craniocaudal and mediolateral oblique
mammograms were obtained. Bilateral screening digital breast
tomosynthesis was performed. The images were evaluated with
computer-aided detection.

[R MLO synth-2D]
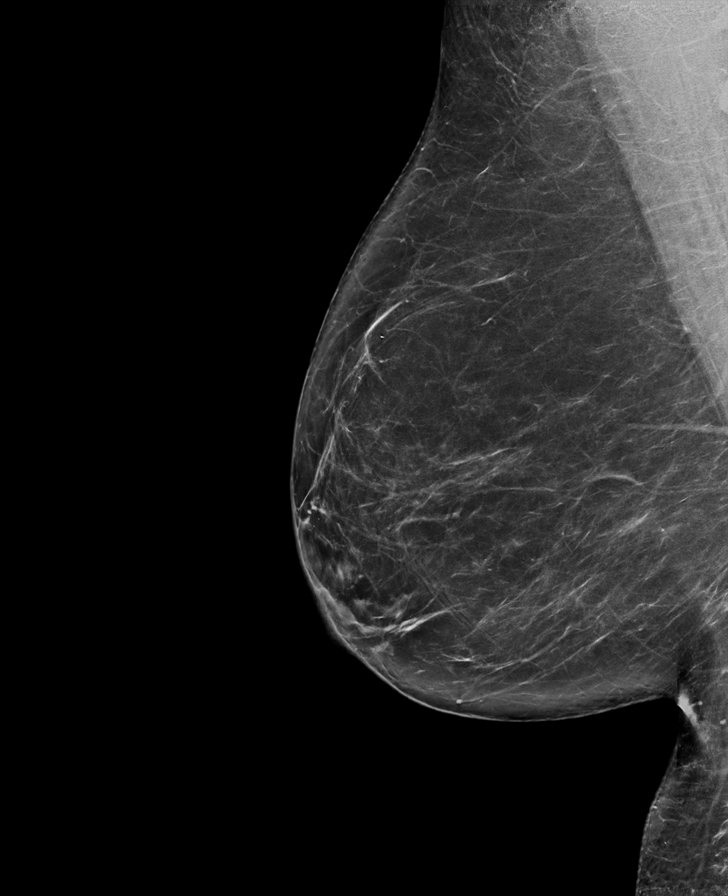

[L CC synth-2D]
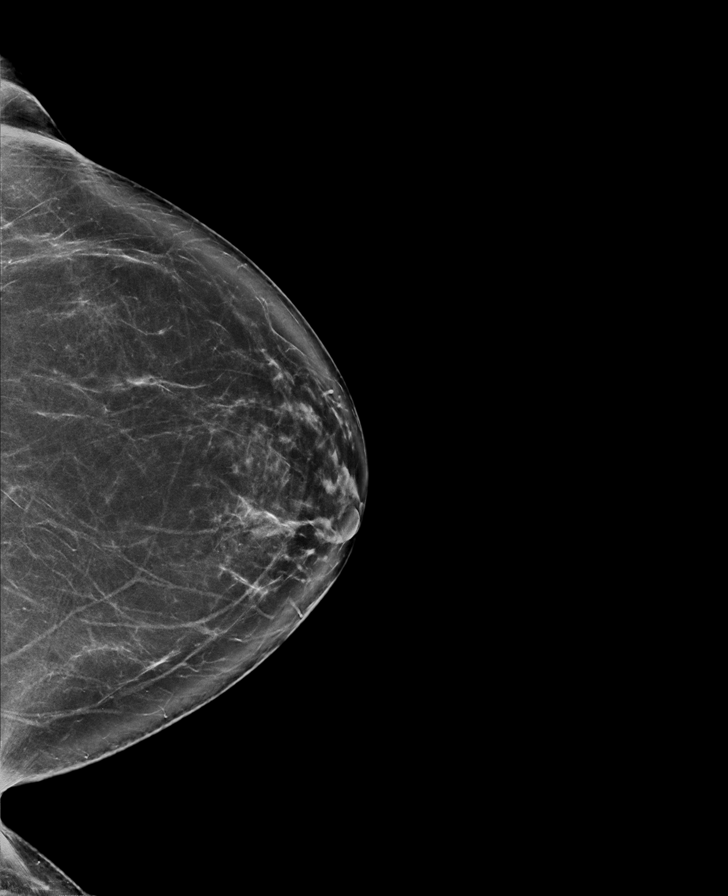

[R CC synth-2D]
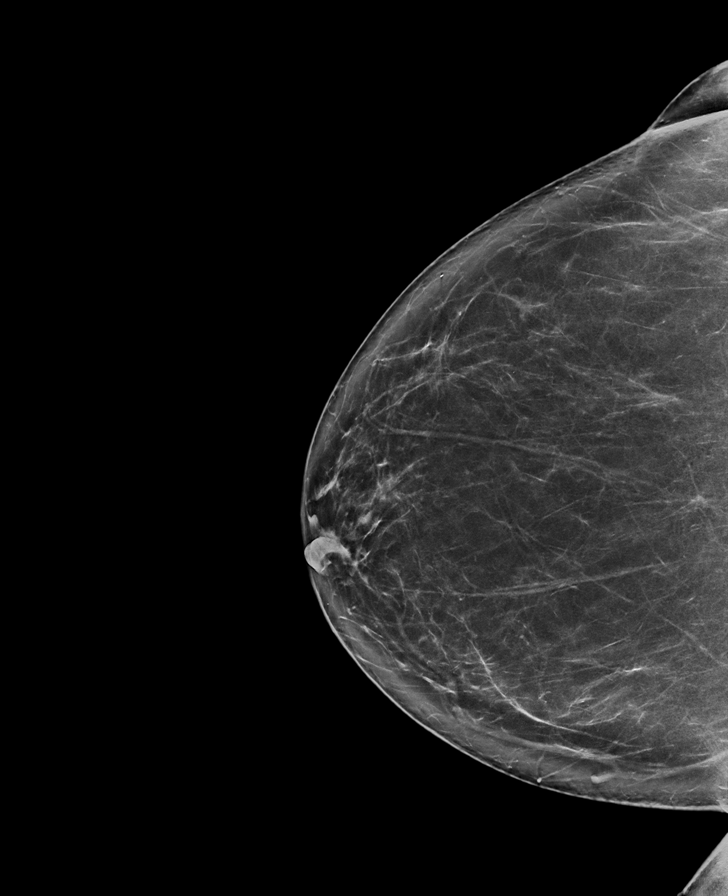

[L MLO synth-2D]
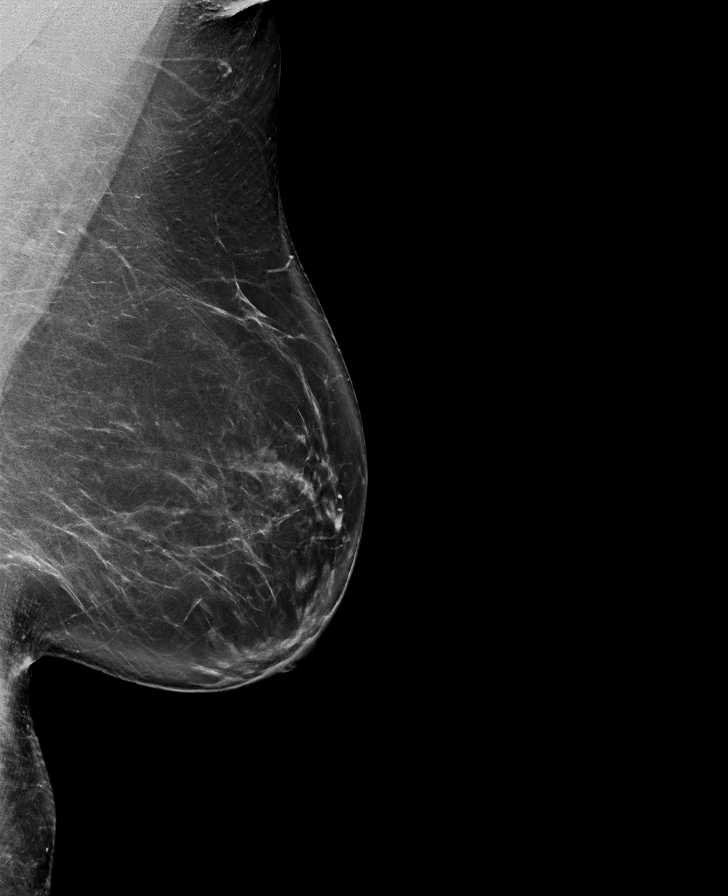

[L MLO tomo · tomo slice 47/94.0]
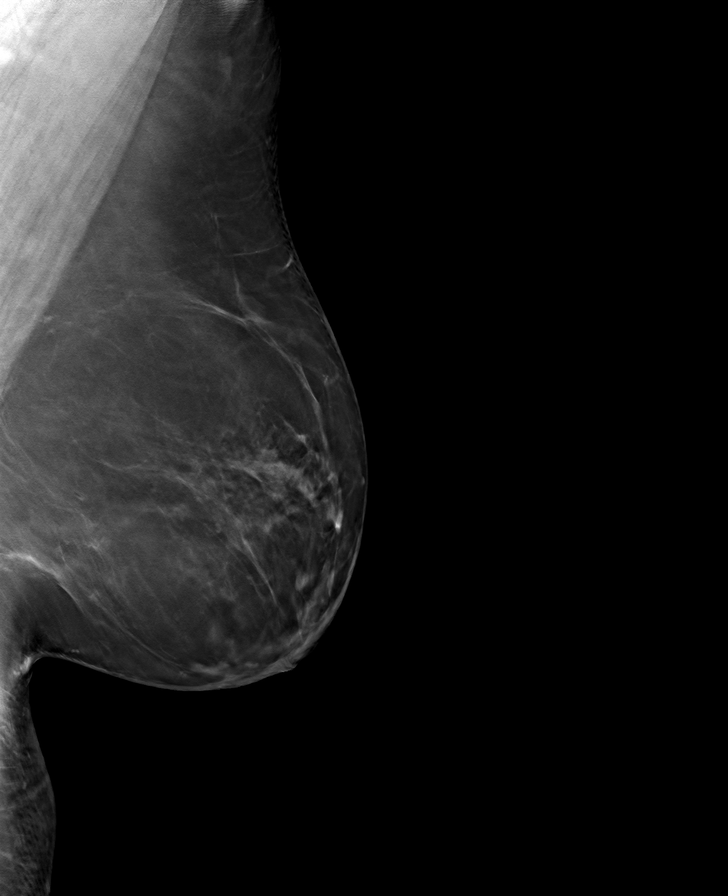

[R MLO tomo · tomo slice 42/83.0]
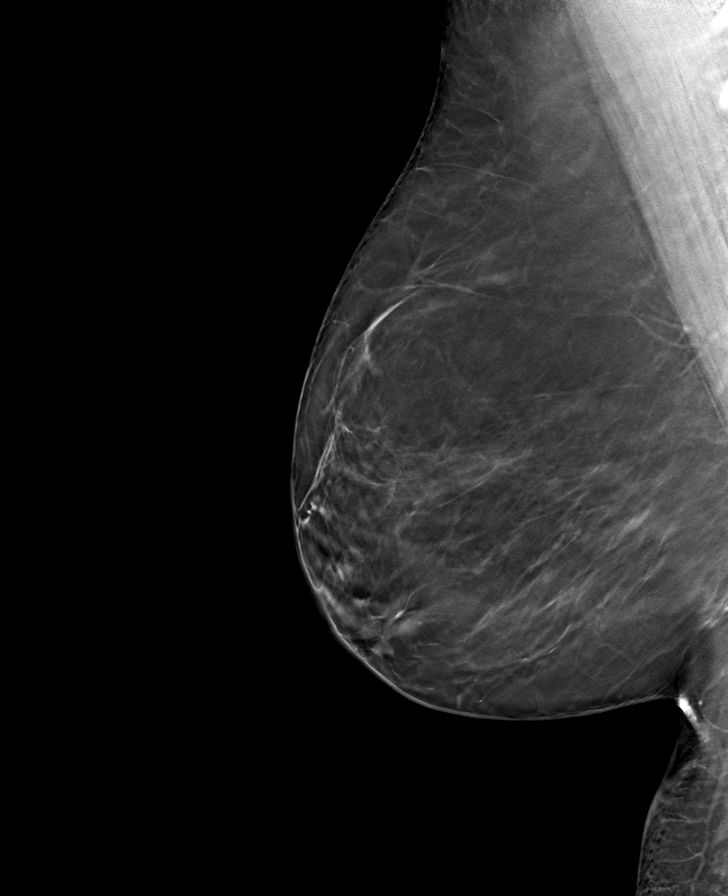

[L CC tomo · tomo slice 42/83.0]
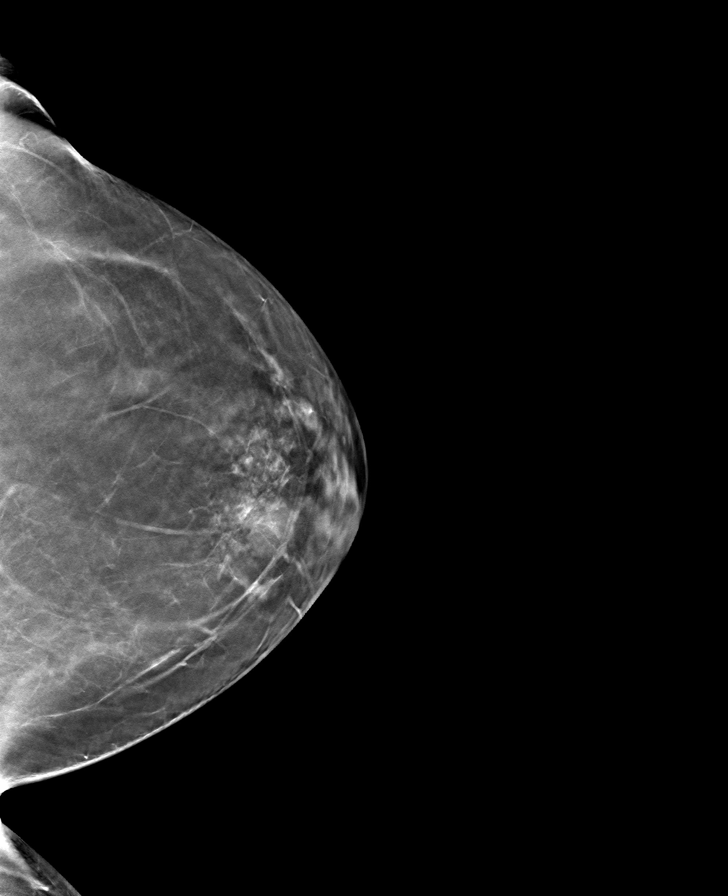

[R CC tomo · tomo slice 41/81.0]
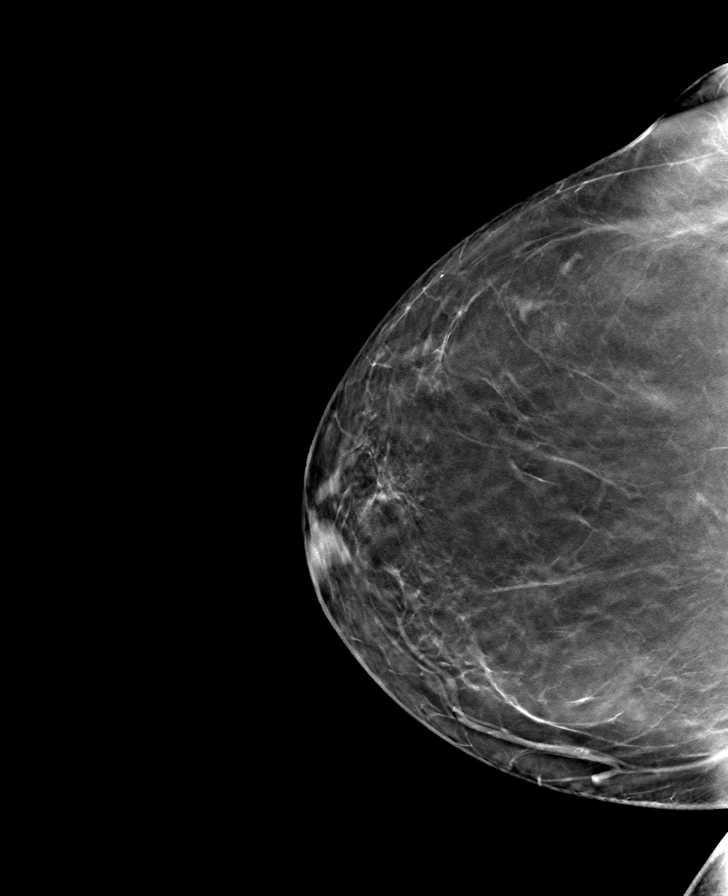

[8 of 24 positions shown; findings below may reference images not displayed]

ACR Breast Density Category b: There are scattered areas of
fibroglandular density.
FINDINGS: There are no findings suspicious for malignancy.
IMPRESSION: No mammographic evidence of malignancy. A result letter of this
screening mammogram will be mailed directly to the patient.

RECOMMENDATION:
Screening mammogram in one year. (Code:51-O-LD2)

BI-RADS CATEGORY  1: Negative.

## 2022-03-04 ENCOUNTER — Ambulatory Visit: Payer: BC Managed Care – PPO | Admitting: Family Medicine

## 2022-03-04 ENCOUNTER — Ambulatory Visit: Payer: Self-pay

## 2022-03-04 VITALS — BP 118/86 | Ht 66.0 in | Wt 202.0 lb

## 2022-03-04 DIAGNOSIS — M79672 Pain in left foot: Secondary | ICD-10-CM | POA: Diagnosis not present

## 2022-03-04 MED ORDER — METHYLPREDNISOLONE ACETATE 40 MG/ML IJ SUSP
20.0000 mg | Freq: Once | INTRAMUSCULAR | Status: AC
  Administered 2022-03-04: 20 mg via INTRA_ARTICULAR

## 2022-03-05 ENCOUNTER — Encounter: Payer: Self-pay | Admitting: Family Medicine

## 2022-03-05 NOTE — Progress Notes (Signed)
PCP: Juluis Pitch, MD ? ?Subjective:  ? ?HPI: ?Patient is a 56 y.o. female here for left foot pain. ? 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 ?Since that time she unfortunately has continued to struggle with discomfort in this toe more at the base of the toe. ?Has tried aleve, inserts. ?Feels like lightning at times that can shoot into this digit. ?She has been seeing Johnston Ebbs and trying to regain some motion in the 2nd MTP. ? ?Past Medical History:  ?Diagnosis Date  ? Anemia   ? Anxiety   ? Arthritis   ? Atypical mole 08/22/2010  ? Left back   ? DDD (degenerative disc disease), lumbar   ? Depression   ? Diverticulosis   ? GERD (gastroesophageal reflux disease)   ? OCC-TUMS PRN  ? History of carpal tunnel syndrome   ? Plantar fasciitis   ? Squamous cell carcinoma of skin 08/09/2019  ? right shoulder  (SCCIS)  ? ? ?Current Outpatient Medications on File Prior to Visit  ?Medication Sig Dispense Refill  ? acetaminophen (TYLENOL) 500 MG tablet Take 1,000 mg by mouth every 6 (six) hours as needed.    ? Calcium Carbonate-Vitamin D 600-200 MG-UNIT TABS Take 1 tablet by mouth daily with lunch.     ? docusate sodium (COLACE) 100 MG capsule Take 1 capsule (100 mg total) by mouth 2 (two) times daily. To keep stools soft 30 capsule 0  ? gabapentin (NEURONTIN) 800 MG tablet Take 1 tablet (800 mg total) by mouth at bedtime for 14 days. Take nightly for 3 days, then up to 14 days as needed 14 tablet 0  ? ibuprofen (ADVIL,MOTRIN) 800 MG tablet Take 1 tablet (800 mg total) by mouth every 8 (eight) hours as needed for moderate pain. 30 tablet 1  ? metroNIDAZOLE (METROCREAM) 0.75 % cream Apply to mid face 1-2 times a day for rosacea. 45 g 3  ? Multiple Vitamins-Minerals (MULTIVITAMIN ADULT) TABS Take 1 tablet by mouth daily.    ? Omega-3 Fatty Acids (FISH OIL) 1000 MG CAPS Take by mouth.    ? oxycodone (OXY-IR) 5 MG capsule Take 1  capsule (5 mg total) by mouth every 6 (six) hours as needed for pain. 15 capsule 0  ? sertraline (ZOLOFT) 50 MG tablet Take 50 mg by mouth every morning.     ? simethicone (MYLICON) 80 MG chewable tablet Chew 80 mg by mouth every 6 (six) hours as needed for flatulence.    ? ?No current facility-administered medications on file prior to visit.  ? ? ?Past Surgical History:  ?Procedure Laterality Date  ? ABLATION ON ENDOMETRIOSIS  05/04/2018  ? Procedure: EXCISION OF ENDOMETRIOSIS;  Surgeon: Benjaman Kindler, MD;  Location: [facility] ORS;  Service: Gynecology;;  ? BACK SURGERY  02/16/2018  ? LOWER LAMINECTOMY  ? CHOLECYSTECTOMY N/A 04/26/2015  ? Procedure: LAPAROSCOPIC CHOLECYSTECTOMY WITH INTRAOPERATIVE CHOLANGIOGRAM;  Surgeon: Molly Maduro, MD;  Location: [facility] ORS;  Service: General;  Laterality: N/A;  ? COLONOSCOPY WITH PROPOFOL N/A 02/26/2016  ? Procedure: COLONOSCOPY WITH PROPOFOL;  Surgeon: Lollie Sails, MD;  Location: [facility] ENDOSCOPY;  Service: Endoscopy;  Laterality: N/A;  ? CYSTOSCOPY  05/04/2018  ? Procedure: CYSTOSCOPY;  Surgeon: Benjaman Kindler, MD;  Location: [facility] ORS;  Service: Gynecology;;  ? ERCP N/A 11/15/2016  ? Procedure: ENDOSCOPIC RETROGRADE CHOLANGIOPANCREATOGRAPHY (ERCP);  Surgeon: Lucilla Lame, MD;  Location: [facility] ENDOSCOPY;  Service: Endoscopy;  Laterality: N/A;  Inpatient RM [id number]  ? ERCP N/A 01/14/2017  ? Procedure: ENDOSCOPIC RETROGRADE CHOLANGIOPANCREATOGRAPHY (ERCP);  Surgeon: Lucilla Lame, MD;  Location: [facility]COPY;  Service: Endoscopy;  Laterality: N/A;  ? ESOPHAGOGASTRODUODENOSCOPY N/A 11/03/2018  ? Procedure: ESOPHAGOGASTRODUODENOSCOPY (EGD);  Surgeon: Lollie Sails, MD;  Location: [facility] ENDOSCOPY;  Service: Endoscopy;  Laterality: N/A;  ? Laminectomy posterior lumbar     ? LAPAROSCOPIC BILATERAL SALPINGECTOMY Bilateral 05/04/2018  ? Procedure: LAPAROSCOPIC BILATERAL SALPINGECTOMY;  Surgeon: Benjaman Kindler, MD;  Location: [facility] ORS;  Service: Gynecology;  Laterality: Bilateral;  ?  LAPAROSCOPIC GASTRIC SLEEVE RESECTION    ? LAPAROSCOPIC HYSTERECTOMY N/A 05/04/2018  ? Procedure: HYSTERECTOMY TOTAL LAPAROSCOPIC;  Surgeon: Benjaman Kindler, MD;  Location: [facility] ORS;  Service: Gynecology;  Laterality: N/A;  ? MICRODISCECTOMY LUMBAR    ? ? ?No Known Allergies ? ?BP 118/86   Ht '5\' 6"'$  (1.676 m)   Wt 202 lb (91.6 kg)   LMP 04/24/2018   BMI 32.60 kg/m?  ? ?   ? View : No data to display.  ?  ?  ?  ? ? ?   ? View : No data to display.  ?  ?  ?  ? ? ?    ?Objective:  ?Physical Exam: ? ?Gen: NAD, comfortable in exam room ? ?Left foot/ankle: ?Well healed dorsal scar of 2nd digit.  No gross deformity, swelling, ecchymoses ?Limited motion of 2nd digit at MTP.  Fused at PIP.  Minimal motion at DIP. ?TTP mildly at 2nd MTP joint.  No tenderness metatarsal heads, elsewhere about foot or ankle. ?Negative metatarsal squeeze. ?NV intact distally. ?  ?Limited MSK u/s left 2nd digit:  Large effusion 2nd MTP with mod-severe arthropathy.  PIP joint fused. ? ?Assessment & Plan:  ?1. 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  Discussed options - posting placed under her 1st and 2nd digits to lift MTP joints here.  Trial injection today as well.  She will continue seeing Dr. Hardin Negus as well.  Icing, tylenol, aleve if needed. ? ?After informed written consent timeout was performed.  Patient was seated on exam table.  Area overlying dorsal left 2nd MTP prepped with alcohol swabs then utilizing ultrasound guidance patient's 2nd MTP joint was injected with 0.5:0.33m lidocaine: depomedrol.  Patient tolerated procedure well without immediate complications. ?

## 2022-05-07 ENCOUNTER — Other Ambulatory Visit: Payer: Self-pay | Admitting: Family Medicine

## 2022-05-07 DIAGNOSIS — Z1231 Encounter for screening mammogram for malignant neoplasm of breast: Secondary | ICD-10-CM

## 2022-06-17 ENCOUNTER — Ambulatory Visit
Admission: RE | Admit: 2022-06-17 | Discharge: 2022-06-17 | Disposition: A | Discharge status: Home / Self Care | Payer: BC Managed Care – PPO | Source: Ambulatory Visit | Attending: Family Medicine | Admitting: Family Medicine

## 2022-06-17 DIAGNOSIS — Z1231 Encounter for screening mammogram for malignant neoplasm of breast: Secondary | ICD-10-CM | POA: Diagnosis not present

## 2022-08-26 ENCOUNTER — Ambulatory Visit (INDEPENDENT_AMBULATORY_CARE_PROVIDER_SITE_OTHER): Payer: BC Managed Care – PPO | Admitting: Dermatology

## 2022-08-26 DIAGNOSIS — D0461 Carcinoma in situ of skin of right upper limb, including shoulder: Secondary | ICD-10-CM

## 2022-08-26 DIAGNOSIS — L578 Other skin changes due to chronic exposure to nonionizing radiation: Secondary | ICD-10-CM

## 2022-08-26 DIAGNOSIS — D485 Neoplasm of uncertain behavior of skin: Secondary | ICD-10-CM

## 2022-08-26 DIAGNOSIS — Z1283 Encounter for screening for malignant neoplasm of skin: Secondary | ICD-10-CM

## 2022-08-26 DIAGNOSIS — Z85828 Personal history of other malignant neoplasm of skin: Secondary | ICD-10-CM

## 2022-08-26 DIAGNOSIS — D2271 Melanocytic nevi of right lower limb, including hip: Secondary | ICD-10-CM | POA: Diagnosis not present

## 2022-08-26 DIAGNOSIS — D239 Other benign neoplasm of skin, unspecified: Secondary | ICD-10-CM

## 2022-08-26 DIAGNOSIS — L821 Other seborrheic keratosis: Secondary | ICD-10-CM

## 2022-08-26 DIAGNOSIS — Q825 Congenital non-neoplastic nevus: Secondary | ICD-10-CM

## 2022-08-26 DIAGNOSIS — D225 Melanocytic nevi of trunk: Secondary | ICD-10-CM | POA: Diagnosis not present

## 2022-08-26 DIAGNOSIS — L988 Other specified disorders of the skin and subcutaneous tissue: Secondary | ICD-10-CM

## 2022-08-26 DIAGNOSIS — L719 Rosacea, unspecified: Secondary | ICD-10-CM

## 2022-08-26 DIAGNOSIS — D229 Melanocytic nevi, unspecified: Secondary | ICD-10-CM

## 2022-08-26 DIAGNOSIS — L570 Actinic keratosis: Secondary | ICD-10-CM | POA: Diagnosis not present

## 2022-08-26 DIAGNOSIS — Z86018 Personal history of other benign neoplasm: Secondary | ICD-10-CM

## 2022-08-26 DIAGNOSIS — L814 Other melanin hyperpigmentation: Secondary | ICD-10-CM

## 2022-08-26 HISTORY — DX: Other benign neoplasm of skin, unspecified: D23.9

## 2022-08-26 NOTE — Patient Instructions (Addendum)
Wound Care Instructions  Cleanse wound gently with soap and water once a day then pat dry with clean gauze. Apply a thin coat of Petrolatum (petroleum jelly, "Vaseline") over the wound (unless you have an allergy to this). We recommend that you use a new, sterile tube of Vaseline. Do not pick or remove scabs. Do not remove the yellow or white "healing tissue" from the base of the wound.  Cover the wound with fresh, clean, nonstick gauze and secure with paper tape. You may use Band-Aids in place of gauze and tape if the wound is small enough, but would recommend trimming much of the tape off as there is often too much. Sometimes Band-Aids can irritate the skin.  You should call the office for your biopsy report after 1 week if you have not already been contacted.  If you experience any problems, such as abnormal amounts of bleeding, swelling, significant bruising, significant pain, or evidence of infection, please call the office immediately.  FOR ADULT SURGERY PATIENTS: If you need something for pain relief you may take 1 extra strength Tylenol (acetaminophen) AND 2 Ibuprofen (200mg each) together every 4 hours as needed for pain. (do not take these if you are allergic to them or if you have a reason you should not take them.) Typically, you may only need pain medication for 1 to 3 days.      Melanoma ABCDEs  Melanoma is the most dangerous type of skin cancer, and is the leading cause of death from skin disease.  You are more likely to develop melanoma if you: Have light-colored skin, light-colored eyes, or red or blond hair Spend a lot of time in the sun Tan regularly, either outdoors or in a tanning bed Have had blistering sunburns, especially during childhood Have a close family member who has had a melanoma Have atypical moles or large birthmarks  Early detection of melanoma is key since treatment is typically straightforward and cure rates are extremely high if we catch it early.    The first sign of melanoma is often a change in a mole or a new dark spot.  The ABCDE system is a way of remembering the signs of melanoma.  A for asymmetry:  The two halves do not match. B for border:  The edges of the growth are irregular. C for color:  A mixture of colors are present instead of an even brown color. D for diameter:  Melanomas are usually (but not always) greater than 6mm - the size of a pencil eraser. E for evolution:  The spot keeps changing in size, shape, and color.  Please check your skin once per month between visits. You can use a small mirror in front and a large mirror behind you to keep an eye on the back side or your body.   If you see any new or changing lesions before your next follow-up, please call to schedule a visit.  Please continue daily skin protection including broad spectrum sunscreen SPF 30+ to sun-exposed areas, reapplying every 2 hours as needed when you're outdoors.   Staying in the shade or wearing long sleeves, sun glasses (UVA+UVB protection) and wide brim hats (4-inch brim around the entire circumference of the hat) are also recommended for sun protection.    Due to recent changes in healthcare laws, you may see results of your pathology and/or laboratory studies on MyChart before the doctors have had a chance to review them. We understand that in some cases there may   be results that are confusing or concerning to you. Please understand that not all results are received at the same time and often the doctors may need to interpret multiple results in order to provide you with the best plan of care or course of treatment. Therefore, we ask that you please give us 2 business days to thoroughly review all your results before contacting the office for clarification. Should we see a critical lab result, you will be contacted sooner.   If You Need Anything After Your Visit  If you have any questions or concerns for your doctor, please call our main  line at [phone] and press option 4 to reach your doctor's medical assistant. If no one answers, please leave a voicemail as directed and we will return your call as soon as possible. Messages left after 4 pm will be answered the following business day.   You may also send us a message via MyChart. We typically respond to MyChart messages within 1-2 business days.  For prescription refills, please ask your pharmacy to contact our office. Our fax number is [phone].  If you have an urgent issue when the clinic is closed that cannot wait until the next business day, you can page your doctor at the number below.    Please note that while we do our best to be available for urgent issues outside of office hours, we are not available 24/7.   If you have an urgent issue and are unable to reach us, you may choose to seek medical care at your doctor's office, retail clinic, urgent care center, or emergency room.  If you have a medical emergency, please immediately call 911 or go to the emergency department.  Pager Numbers  - Dr. Kowalski: [phone]  - Dr. Moye: [phone]  - Dr. Stewart: [phone]  In the event of inclement weather, please call our main line at [phone] for an update on the status of any delays or closures.  Dermatology Medication Tips: Please keep the boxes that topical medications come in in order to help keep track of the instructions about where and how to use these. Pharmacies typically print the medication instructions only on the boxes and not directly on the medication tubes.   If your medication is too expensive, please contact our office at [phone] option 4 or send us a message through MyChart.   We are unable to tell what your co-pay for medications will be in advance as this is different depending on your insurance coverage. However, we may be able to find a substitute medication at lower cost or fill out paperwork to get insurance to cover a  needed medication.   If a prior authorization is required to get your medication covered by your insurance company, please allow us 1-2 business days to complete this process.  Drug prices often vary depending on where the prescription is filled and some pharmacies may offer cheaper prices.  The website www.goodrx.com contains coupons for medications through different pharmacies. The prices here do not account for what the cost may be with help from insurance (it may be cheaper with your insurance), but the website can give you the price if you did not use any insurance.  - You can print the associated coupon and take it with your prescription to the pharmacy.  - You may also stop by our office during regular business hours and pick up a GoodRx coupon card.  - If you need your prescription sent electronically to a   different pharmacy, notify our office through [place] MyChart or by phone at [phone] option 4.     Si Usted Necesita Algo Despus de Su Visita  Tambin puede enviarnos un mensaje a travs de MyChart. Por lo general respondemos a los mensajes de MyChart en el transcurso de 1 a 2 das hbiles.  Para renovar recetas, por favor pida a su farmacia que se ponga en contacto con nuestra oficina. Nuestro nmero de fax es el [phone].  Si tiene un asunto urgente cuando la clnica est cerrada y que no puede esperar hasta el siguiente da hbil, puede llamar/localizar a su doctor(a) al nmero que aparece a continuacin.   Por favor, tenga en cuenta que aunque hacemos todo lo posible para estar disponibles para asuntos urgentes fuera del horario de oficina, no estamos disponibles las 24 horas del da, los 7 das de la semana.   Si tiene un problema urgente y no puede comunicarse con nosotros, puede optar por buscar atencin mdica  en el consultorio de su doctor(a), en una clnica privada, en un centro de atencin urgente o en una sala de emergencias.  Si tiene una emergencia  mdica, por favor llame inmediatamente al 911 o vaya a la sala de emergencias.  Nmeros de bper  - Dr. Kowalski: [phone]  - Dra. Moye: [phone]  - Dra. Stewart: [phone]  En caso de inclemencias del tiempo, por favor llame a nuestra lnea principal al [phone] para una actualizacin sobre el estado de cualquier retraso o cierre.  Consejos para la medicacin en dermatologa: Por favor, guarde las cajas en las que vienen los medicamentos de uso tpico para ayudarle a seguir las instrucciones sobre dnde y cmo usarlos. Las farmacias generalmente imprimen las instrucciones del medicamento slo en las cajas y no directamente en los tubos del medicamento.   Si su medicamento es muy caro, por favor, pngase en contacto con nuestra oficina llamando al [phone] y presione la opcin 4 o envenos un mensaje a travs de MyChart.   No podemos decirle cul ser su copago por los medicamentos por adelantado ya que esto es diferente dependiendo de la cobertura de su seguro. Sin embargo, es posible que podamos encontrar un medicamento sustituto a menor costo o llenar un formulario para que el seguro cubra el medicamento que se considera necesario.   Si se requiere una autorizacin previa para que su compaa de seguros cubra su medicamento, por favor permtanos de 1 a 2 das hbiles para completar este proceso.  Los precios de los medicamentos varan con frecuencia dependiendo del lugar de dnde se surte la receta y alguna farmacias pueden ofrecer precios ms baratos.  El sitio web www.goodrx.com tiene cupones para medicamentos de diferentes farmacias. Los precios aqu no tienen en cuenta lo que podra costar con la ayuda del seguro (puede ser ms barato con su seguro), pero el sitio web puede darle el precio si no utiliz ningn seguro.  - Puede imprimir el cupn correspondiente y llevarlo con su receta a la farmacia.  - Tambin puede pasar por nuestra oficina durante el horario de  atencin regular y recoger una tarjeta de cupones de GoodRx.  - Si necesita que su receta se enve electrnicamente a una farmacia diferente, informe a nuestra oficina a travs de MyChart de [place] o por telfono llamando al [phone] y presione la opcin 4.  

## 2022-08-26 NOTE — Progress Notes (Signed)
Follow-Up Visit   Subjective  Bethany Flores is a 56 y.o. female who presents for the following: Annual Exam.  The patient presents for Total-Body Skin Exam (TBSE) for skin cancer screening and mole check.  The patient has spots, moles and lesions to be evaluated, some may be new or changing. 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 She has a scaly spot on her left cheek that came up 2-3 months ago. She uses metronidazole 0.75% cream occasionally for rosacea.   The following portions of the chart were reviewed this encounter and updated as appropriate:       Review of Systems:  No other skin or systemic complaints except as noted in HPI or Assessment and Plan.  Objective  Well appearing patient in no apparent distress; mood and affect are within normal limits.  A full examination was performed including scalp, head, eyes, ears, nose, lips, neck, chest, axillae, abdomen, back, buttocks, bilateral upper extremities, bilateral lower extremities, hands, feet, fingers, toes, fingernails, and toenails. All findings within normal limits unless otherwise noted below.  L spinal mid upper back; R upper thigh at inguinal crease Right upper thigh at inguinal crease: 4 x 3 mm med dark brown papule   L spinal mid upper back: 46m speckled brown macule  Stable compared to previous photos 08/08/2020  Left Flank 1 cm speckled brown macule  L cheek x 1 , R lateral cheek x 1 , R med upper eyebrow x 1 (3) Pink scaly macules  face Erythema of the cheeks and nose  R shoulder inf edge of scar [id number] scaly papule      face Rhytides and volume loss.     Assessment & Plan  Skin cancer screening performed today.  Actinic Damage - chronic, secondary to cumulative UV radiation exposure/sun exposure over time - diffuse scaly erythematous macules with underlying dyspigmentation - Recommend daily broad spectrum sunscreen SPF 30+ to sun-exposed  areas, reapply every 2 hours as needed.  - Recommend staying in the shade or wearing long sleeves, sun glasses (UVA+UVB protection) and wide brim hats (4-inch brim around the entire circumference of the hat). - Call for new or changing lesions.  History of Squamous Cell Carcinoma in Situ of the Skin - Possible recurrence today of the right shoulder- biopsy/EDC today - Recommend regular full body skin exams - Recommend daily broad spectrum sunscreen SPF 30+ to sun-exposed areas, reapply every 2 hours as needed.  - Call if any new or changing lesions are noted between office visits  History of Dysplastic Nevi - No evidence of recurrence today - Recommend regular full body skin exams - Recommend daily broad spectrum sunscreen SPF 30+ to sun-exposed areas, reapply every 2 hours as needed.  - Call if any new or changing lesions are noted between office visits  Seborrheic Keratoses - Stuck-on, waxy, tan-brown papules and/or plaques  - Benign-appearing - Discussed benign etiology and prognosis. - Observe - Call for any changes  Hemangiomas - Red papules - Discussed benign nature - Observe - Call for any changes  Lentigines - Scattered tan macules - Due to sun exposure - Benign-appearing, observe - Recommend daily broad spectrum sunscreen SPF 30+ to sun-exposed areas, reapply every 2 hours as needed. - Call for any changes  Nevus L spinal mid upper back; R upper thigh at inguinal crease  Benign-appearing, stable compared to previous photos.  Observation.  Call clinic for new or changing  moles.  Recommend daily use of broad spectrum spf 30+ sunscreen to sun-exposed areas.   Congenital non-neoplastic nevus Left Flank  Benign-appearing.  Stable compared to previous photo. Observation.  Call clinic for new or changing moles.  Recommend daily use of broad spectrum spf 30+ sunscreen to sun-exposed areas.    AK (actinic keratosis) (3) L cheek x 1 , R lateral cheek x 1 , R med upper  eyebrow x 1  Actinic keratoses are precancerous spots that appear secondary to cumulative UV radiation exposure/sun exposure over time. They are chronic with expected duration over 1 year. A portion of actinic keratoses will progress to squamous cell carcinoma of the skin. It is not possible to reliably predict which spots will progress to skin cancer and so treatment is recommended to prevent development of skin cancer.  Recommend daily broad spectrum sunscreen SPF 30+ to sun-exposed areas, reapply every 2 hours as needed.  Recommend staying in the shade or wearing long sleeves, sun glasses (UVA+UVB protection) and wide brim hats (4-inch brim around the entire circumference of the hat). Call for new or changing lesions.  Destruction of lesion - L cheek x 1 , R lateral cheek x 1 , R med upper eyebrow x 1  Destruction method: cryotherapy   Informed consent: discussed and consent obtained   Lesion destroyed using liquid nitrogen: Yes   Region frozen until ice ball extended beyond lesion: Yes   Outcome: patient tolerated procedure well with no complications   Post-procedure details: wound care instructions given   Additional details:  Prior to procedure, discussed risks of blister formation, small wound, skin dyspigmentation, or rare scar following cryotherapy. Recommend Vaseline ointment to treated areas while healing.   Rosacea face  Chronic condition with duration or expected duration over one year. Currently well-controlled.   Rosacea is a chronic progressive skin condition usually affecting the face of adults, causing redness and/or acne bumps. It is treatable but not curable. It sometimes affects the eyes (ocular rosacea) as well. It may respond to topical and/or systemic medication and can flare with stress, sun exposure, alcohol, exercise, topical steroids (including hydrocortisone/cortisone 10) and some foods.  Daily application of broad spectrum spf 30+ sunscreen to face is recommended  to reduce flares.  Continue metronidazole 0.75% cream Apply to face qd/bid for rosacea. Patient will call for refills.    Related Medications metroNIDAZOLE (METROCREAM) 0.75 % cream Apply to mid face 1-2 times a day for rosacea.  Neoplasm of uncertain behavior of skin R shoulder inf edge of scar  Skin / nail biopsy Type of biopsy: tangential   Informed consent: discussed and consent obtained   Patient was prepped and draped in usual sterile fashion: Area prepped with alcohol. Anesthesia: the lesion was anesthetized in a standard fashion   Anesthetic:  1% lidocaine w/ epinephrine 1-100,000 buffered w/ 8.4% NaHCO3 Instrument used: flexible razor blade   Hemostasis achieved with: pressure, aluminum chloride and electrodesiccation   Outcome: patient tolerated procedure well    Destruction of lesion  Destruction method: electrodesiccation and curettage   Informed consent: discussed and consent obtained   Timeout:  patient name, date of birth, surgical site, and procedure verified Anesthesia: the lesion was anesthetized in a standard fashion   Anesthetic:  1% lidocaine w/ epinephrine 1-100,000 local infiltration Curettage performed in three different directions: Yes   Electrodesiccation performed over the curetted area: Yes   Final wound size (cm):  1 Hemostasis achieved with:  pressure, aluminum chloride and electrodesiccation Outcome: patient  tolerated procedure well with no complications   Post-procedure details: wound care instructions given   Post-procedure details comment:  Ointment and bandage applied.  Specimen 1 - Surgical pathology Differential Diagnosis: Recurrent SCC in situ vs other Check Margins: No EDC today  Biopsy and EDC performed today.  Elastosis of skin face  Discussed Vitamin C, Hyaluranic acid serum, and retinol/retinoids- Samples given.   Basic OTC skin care regimen to prevent photoaging:   Recommend facial moisturizer with sunscreen SPF 30 every  morning (brands include CeraVe AM, Neutrogena, Eucerin, Cetaphil, Aveeno, La Roche Posay).  Can also apply a topical Vit C serum which is an antioxidant (brands include CeraVe, La Roche Posay, and The Ordinary) underneath sunscreen in morning.   At night recommend a cream with retinol (which stimulates collagen production) like CeraVe skin renewing retinol serum or ROC retinol correxion cream or Neutrogena rapid wrinkle repair cream. Retinol may cause skin irritation in people with sensitive skin.  Can use it every other day and/or apply on top of a hyaluronic acid (HA) moisturizer/serum if better tolerated that way.  Retinol may also help with lightening age spots.   Our office sells high quality, medically tested skin care lines such as Elta MD sunscreens (with Zinc), and Alastin skin care products, which are very effective in treating photoaging. The Alastin line includes cosmeceutical grade Vit.C serum, HA serum, Elastin stimulating moisturizers/serums, lightening serum, and sunscreens.  If you want prescription treatment, then you would need an appointment (Rx tretinoin and fade creams, Botox, filler injections, laser treatments, etc.) These prescriptions and procedures are not covered by insurance but work very well.    Return in about 6 months (around 02/25/2023) for recheck bx site.  IJamesetta Orleans, CMA, am acting as scribe for Brendolyn Patty, MD .  Documentation: I have reviewed the above documentation for accuracy and completeness, and I agree with the above.  Brendolyn Patty MD

## 2022-09-02 ENCOUNTER — Telehealth: Payer: Self-pay

## 2022-09-02 NOTE — Telephone Encounter (Signed)
Left pt msg to call for bx result/sh °

## 2022-09-02 NOTE — Telephone Encounter (Signed)
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  After it heals, I would treat R shoulder area with 5FU/VitD cream twice daily for 2-3 weeks. [facility] send to [facility].    5-fluorouracil/calcipotriene cream is is a type of field treatment used to treat precancers, thin skin cancers, and areas of sun damage. Expected reaction includes irritation and mild inflammation potentially progressing to more severe inflammation including redness, scaling, crusting and open sores/erosions.  If too much irritation occurs, ensure application of only a thin layer and decrease frequency of use to achieve a tolerable level of inflammation. Recommend applying Vaseline ointment to open sores as needed.  Minimize sun exposure while under treatment. Recommend daily broad spectrum sunscreen SPF 30+ to sun-exposed areas, reapply every 2 hours as needed.    - please call patient

## 2022-09-03 ENCOUNTER — Telehealth: Payer: Self-pay

## 2022-09-03 NOTE — Telephone Encounter (Signed)
Patient advised of BX results and RX sent into [facility]. All questions answered and patient advised to call our office if she has not heard from pharmacy within one week. aw

## 2022-09-03 NOTE — Telephone Encounter (Signed)
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  After it heals, I would treat R shoulder area with 5FU/VitD cream twice daily for 2-3 weeks. [facility] send to [facility].    5-fluorouracil/calcipotriene cream is is a type of field treatment used to treat precancers, thin skin cancers, and areas of sun damage. Expected reaction includes irritation and mild inflammation potentially progressing to more severe inflammation including redness, scaling, crusting and open sores/erosions.  If too much irritation occurs, ensure application of only a thin layer and decrease frequency of use to achieve a tolerable level of inflammation. Recommend applying Vaseline ointment to open sores as needed.  Minimize sun exposure while under treatment. Recommend daily broad spectrum sunscreen SPF 30+ to sun-exposed areas, reapply every 2 hours as needed.    - please call patient

## 2023-03-17 ENCOUNTER — Encounter: Payer: Self-pay | Admitting: Dermatology

## 2023-03-17 ENCOUNTER — Ambulatory Visit: Payer: BC Managed Care – PPO | Admitting: Dermatology

## 2023-03-17 VITALS — BP 117/71

## 2023-03-17 DIAGNOSIS — Z86018 Personal history of other benign neoplasm: Secondary | ICD-10-CM | POA: Diagnosis not present

## 2023-03-17 DIAGNOSIS — L578 Other skin changes due to chronic exposure to nonionizing radiation: Secondary | ICD-10-CM | POA: Diagnosis not present

## 2023-03-17 DIAGNOSIS — I781 Nevus, non-neoplastic: Secondary | ICD-10-CM | POA: Diagnosis not present

## 2023-03-17 DIAGNOSIS — L570 Actinic keratosis: Secondary | ICD-10-CM

## 2023-03-17 DIAGNOSIS — Z86007 Personal history of in-situ neoplasm of skin: Secondary | ICD-10-CM | POA: Diagnosis not present

## 2023-03-17 NOTE — Patient Instructions (Addendum)
Cryotherapy Aftercare  Wash gently with soap and water everyday.   Apply Vaseline and Band-Aid daily until healed.     Due to recent changes in healthcare laws, you may see results of your pathology and/or laboratory studies on MyChart before the doctors have had a chance to review them. We understand that in some cases there may be results that are confusing or concerning to you. Please understand that not all results are received at the same time and often the doctors may need to interpret multiple results in order to provide you with the best plan of care or course of treatment. Therefore, we ask that you please give us 2 business days to thoroughly review all your results before contacting the office for clarification. Should we see a critical lab result, you will be contacted sooner.   If You Need Anything After Your Visit  If you have any questions or concerns for your doctor, please call our main line at [phone] and press option 4 to reach your doctor's medical assistant. If no one answers, please leave a voicemail as directed and we will return your call as soon as possible. Messages left after 4 pm will be answered the following business day.   You may also send us a message via MyChart. We typically respond to MyChart messages within 1-2 business days.  For prescription refills, please ask your pharmacy to contact our office. Our fax number is [phone].  If you have an urgent issue when the clinic is closed that cannot wait until the next business day, you can page your doctor at the number below.    Please note that while we do our best to be available for urgent issues outside of office hours, we are not available 24/7.   If you have an urgent issue and are unable to reach us, you may choose to seek medical care at your doctor's office, retail clinic, urgent care center, or emergency room.  If you have a medical emergency, please immediately call 911 or go to the  emergency department.  Pager Numbers  - Dr. Kowalski: [phone]  - Dr. Moye: [phone]  - Dr. Stewart: [phone]  In the event of inclement weather, please call our main line at [phone] for an update on the status of any delays or closures.  Dermatology Medication Tips: Please keep the boxes that topical medications come in in order to help keep track of the instructions about where and how to use these. Pharmacies typically print the medication instructions only on the boxes and not directly on the medication tubes.   If your medication is too expensive, please contact our office at [phone] option 4 or send us a message through MyChart.   We are unable to tell what your co-pay for medications will be in advance as this is different depending on your insurance coverage. However, we may be able to find a substitute medication at lower cost or fill out paperwork to get insurance to cover a needed medication.   If a prior authorization is required to get your medication covered by your insurance company, please allow us 1-2 business days to complete this process.  Drug prices often vary depending on where the prescription is filled and some pharmacies may offer cheaper prices.  The website www.goodrx.com contains coupons for medications through different pharmacies. The prices here do not account for what the cost may be with help from insurance (it may be cheaper with your insurance), but the website can   give you the price if you did not use any insurance.  - You can print the associated coupon and take it with your prescription to the pharmacy.  - You may also stop by our office during regular business hours and pick up a GoodRx coupon card.  - If you need your prescription sent electronically to a different pharmacy, notify our office through [facility] MyChart or by phone at [phone] option 4.     Si Usted Necesita Algo Despus de Su Visita  Tambin puede  enviarnos un mensaje a travs de MyChart. Por lo general respondemos a los mensajes de MyChart en el transcurso de 1 a 2 das hbiles.  Para renovar recetas, por favor pida a su farmacia que se ponga en contacto con nuestra oficina. Nuestro nmero de fax es el [phone].  Si tiene un asunto urgente cuando la clnica est cerrada y que no puede esperar hasta el siguiente da hbil, puede llamar/localizar a su doctor(a) al nmero que aparece a continuacin.   Por favor, tenga en cuenta que aunque hacemos todo lo posible para estar disponibles para asuntos urgentes fuera del horario de oficina, no estamos disponibles las 24 horas del da, los 7 das de la semana.   Si tiene un problema urgente y no puede comunicarse con nosotros, puede optar por buscar atencin mdica  en el consultorio de su doctor(a), en una clnica privada, en un centro de atencin urgente o en una sala de emergencias.  Si tiene una emergencia mdica, por favor llame inmediatamente al 911 o vaya a la sala de emergencias.  Nmeros de bper  - Dr. Kowalski: [phone]  - Dra. Moye: [phone]  - Dra. Stewart: [phone]  En caso de inclemencias del tiempo, por favor llame a nuestra lnea principal al [phone] para una actualizacin sobre el estado de cualquier retraso o cierre.  Consejos para la medicacin en dermatologa: Por favor, guarde las cajas en las que vienen los medicamentos de uso tpico para ayudarle a seguir las instrucciones sobre dnde y cmo usarlos. Las farmacias generalmente imprimen las instrucciones del medicamento slo en las cajas y no directamente en los tubos del medicamento.   Si su medicamento es muy caro, por favor, pngase en contacto con nuestra oficina llamando al [phone] y presione la opcin 4 o envenos un mensaje a travs de MyChart.   No podemos decirle cul ser su copago por los medicamentos por adelantado ya que esto es diferente dependiendo de la cobertura de su seguro.  Sin embargo, es posible que podamos encontrar un medicamento sustituto a menor costo o llenar un formulario para que el seguro cubra el medicamento que se considera necesario.   Si se requiere una autorizacin previa para que su compaa de seguros cubra su medicamento, por favor permtanos de 1 a 2 das hbiles para completar este proceso.  Los precios de los medicamentos varan con frecuencia dependiendo del lugar de dnde se surte la receta y alguna farmacias pueden ofrecer precios ms baratos.  El sitio web www.goodrx.com tiene cupones para medicamentos de diferentes farmacias. Los precios aqu no tienen en cuenta lo que podra costar con la ayuda del seguro (puede ser ms barato con su seguro), pero el sitio web puede darle el precio si no utiliz ningn seguro.  - Puede imprimir el cupn correspondiente y llevarlo con su receta a la farmacia.  - Tambin puede pasar por nuestra oficina durante el horario de atencin regular y recoger una tarjeta de cupones de GoodRx.  -   Si necesita que su receta se enve electrnicamente a una farmacia diferente, informe a nuestra oficina a travs de MyChart de [place] o por telfono llamando al [phone] y presione la opcin 4.  

## 2023-03-17 NOTE — Progress Notes (Signed)
   Follow-Up Visit   Subjective  Bethany Flores is a 57 y.o. female who presents for the following:  recheck R shoulder inf edge of scar, Bx proven SCC IS and Dysplastic nevus, EDC and 5FU/Caclipotriene bid for 3 wks, pt did not get a reaction   The following portions of the chart were reviewed this encounter and updated as appropriate: medications, allergies, medical history  Review of Systems:  No other skin or systemic complaints except as noted in HPI or Assessment and Plan.  Objective  Well appearing patient in no apparent distress; mood and affect are within normal limits.   A focused examination was performed of the following areas: R shoulder, face  Relevant exam findings are noted in the Assessment and Plan.  L malar cheek x 1, R malar cheek x 1, R mid cheek x 1 (3) Pink scaly macules     Assessment & Plan     AK (actinic keratosis) (3) L malar cheek x 1, R malar cheek x 1, R mid cheek x 1  Destruction of lesion - L malar cheek x 1, R malar cheek x 1, R mid cheek x 1  Destruction method: cryotherapy   Informed consent: discussed and consent obtained   Lesion destroyed using liquid nitrogen: Yes   Region frozen until ice ball extended beyond lesion: Yes   Outcome: patient tolerated procedure well with no complications   Post-procedure details: wound care instructions given   Additional details:  Prior to procedure, discussed risks of blister formation, small wound, skin dyspigmentation, or rare scar following cryotherapy. Recommend Vaseline ointment to treated areas while healing.      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 - No evidence of recurrence today R shoulder - Recommend regular full body skin exams - Recommend daily broad spectrum sunscreen SPF 30+ to sun-exposed areas, reapply every 2 hours as needed.  - Call if any new or changing lesions are  noted between office visits  - R shoulder inf edge of scar with pink macule c/w scar with telangiectasia, txted with EDC and 5FU/Calcipotriene, no reaction/change from topical treatment.  Observe.   HISTORY OF DYSPLASTIC NEVUS No evidence of recurrence today Recommend regular full body skin exams Recommend daily broad spectrum sunscreen SPF 30+ to sun-exposed areas, reapply every 2 hours as needed.  Call if any new or changing lesions are noted between office visits  - R shoulder inf edge of scar   Telangiectasia Exam: scar with telangiectasia R shoulder inf edge of scar of SCCIS site  Treatment Plan: Benign, observe for changes  ACTINIC DAMAGE - chronic, secondary to cumulative UV radiation exposure/sun exposure over time - diffuse scaly erythematous macules with underlying dyspigmentation - Recommend daily broad spectrum sunscreen SPF 30+ to sun-exposed areas, reapply every 2 hours as needed.  - Recommend staying in the shade or wearing long sleeves, sun glasses (UVA+UVB protection) and wide brim hats (4-inch brim around the entire circumference of the hat). - Call for new or changing lesions.  - chest  Return in about 6 months (around 09/16/2023) for TBSE, Hx of SCC, Hx of Dysplastic nevi, Hx of AKs.  I, Ardis Rowan, RMA, am acting as scribe for [name], MD .   Documentation: I have reviewed the above documentation for accuracy and completeness, and I agree with the above.  [name], MD

## 2023-03-17 NOTE — Progress Notes (Deleted)
Follow-Up Visit   Subjective  Bethany Flores is a 57 y.o. female who presents for the following: recheck R shoulder inf edge of scar, Bx proven SCC IS and Dysplastic nevus, EDC and 5FU/Caclipotriene bid for 3 wks, pt did not get a reaction   The following portions of the chart were reviewed this encounter and updated as appropriate: medications, allergies, medical history  Review of Systems:  No other skin or systemic complaints except as noted in HPI or Assessment and Plan.  Objective  Well appearing patient in no apparent distress; mood and affect are within normal limits.   A focused examination was performed of the following areas: R shoulder, chest, face  Relevant exam findings are noted in the Assessment and Plan.    Assessment & Plan   HISTORY OF SQUAMOUS CELL CARCINOMA IN SITU OF THE SKIN - No evidence of recurrence today - Recommend regular full body skin exams - Recommend daily broad spectrum sunscreen SPF 30+ to sun-exposed areas, reapply every 2 hours as needed.  - Call if any new or changing lesions are noted between office visits  - R shoulder inf edge of scar clear, txted with EDC and 5FU/Calcipotriene  HISTORY OF DYSPLASTIC NEVUS No evidence of recurrence today Recommend regular full body skin exams Recommend daily broad spectrum sunscreen SPF 30+ to sun-exposed areas, reapply every 2 hours as needed.  Call if any new or changing lesions are noted between office visits  - R shoulder inf edge of scar  Telangiectasia Exam: scar with telangiectasia R shoulder if edge of scar  Treatment Plan: Benign, observe  ACTINIC DAMAGE - chronic, secondary to cumulative UV radiation exposure/sun exposure over time - diffuse scaly erythematous macules with underlying dyspigmentation - Recommend daily broad spectrum sunscreen SPF 30+ to sun-exposed areas, reapply every 2 hours as needed.  - Recommend staying in the shade or wearing long sleeves, sun glasses (UVA+UVB  protection) and wide brim hats (4-inch brim around the entire circumference of the hat). - Call for new or changing lesions.  - chest  ACTINIC KERATOSIS Exam: Erythematous thin papules/macules with gritty scale  Actinic keratoses are precancerous spots that appear secondary to cumulative UV radiation exposure/sun exposure over time. They are chronic with expected duration over 1 year. A portion of actinic keratoses will progress to squamous cell carcinoma of the skin. It is not possible to reliably predict which spots will progress to skin cancer and so treatment is recommended to prevent development of skin cancer.  Recommend daily broad spectrum sunscreen SPF 30+ to sun-exposed areas, reapply every 2 hours as needed.  Recommend staying in the shade or wearing long sleeves, sun glasses (UVA+UVB protection) and wide brim hats (4-inch brim around the entire circumference of the hat). Call for new or changing lesions.  Treatment Plan:  Prior to procedure, discussed risks of blister formation, small wound, skin dyspigmentation, or rare scar following cryotherapy. Recommend Vaseline ointment to treated areas while healing.  Destruction Procedure Note Destruction method: cryotherapy   Informed consent: discussed and consent obtained   Lesion destroyed using liquid nitrogen: Yes   Outcome: patient tolerated procedure well with no complications   Post-procedure details: wound care instructions given   Locations: L malar cheek x 1, R malar cheek x 1, R mid cheek x 1 # of Lesions Treated: 3   Return in about 6 months (around 09/16/2023) for TBSE, Hx of SCC, Hx of Dysplastic nevi, Hx of AKs.  I, Zuriyah Shatz, RMA, am acting  as scribe for [name], MD .   Documentation: I have reviewed the above documentation for accuracy and completeness, and I agree with the above.  [name], MD

## 2023-09-01 ENCOUNTER — Encounter: Payer: Self-pay | Admitting: Emergency Medicine

## 2023-09-01 ENCOUNTER — Other Ambulatory Visit: Payer: Self-pay

## 2023-09-01 ENCOUNTER — Emergency Department: Payer: BC Managed Care – PPO

## 2023-09-01 ENCOUNTER — Inpatient Hospital Stay
Admission: EM | Admit: 2023-09-01 | Discharge: 2023-09-05 | DRG: 444 | Disposition: A | Discharge status: Home / Self Care | Payer: BC Managed Care – PPO | Attending: Internal Medicine | Admitting: Internal Medicine

## 2023-09-01 DIAGNOSIS — K831 Obstruction of bile duct: Secondary | ICD-10-CM | POA: Diagnosis not present

## 2023-09-01 DIAGNOSIS — Z79899 Other long term (current) drug therapy: Secondary | ICD-10-CM

## 2023-09-01 DIAGNOSIS — Z90710 Acquired absence of both cervix and uterus: Secondary | ICD-10-CM

## 2023-09-01 DIAGNOSIS — R1011 Right upper quadrant pain: Secondary | ICD-10-CM | POA: Diagnosis not present

## 2023-09-01 DIAGNOSIS — M1990 Unspecified osteoarthritis, unspecified site: Secondary | ICD-10-CM | POA: Diagnosis present

## 2023-09-01 DIAGNOSIS — Z9884 Bariatric surgery status: Secondary | ICD-10-CM

## 2023-09-01 DIAGNOSIS — F419 Anxiety disorder, unspecified: Secondary | ICD-10-CM | POA: Diagnosis present

## 2023-09-01 DIAGNOSIS — R7989 Other specified abnormal findings of blood chemistry: Secondary | ICD-10-CM | POA: Diagnosis present

## 2023-09-01 DIAGNOSIS — E162 Hypoglycemia, unspecified: Secondary | ICD-10-CM | POA: Diagnosis not present

## 2023-09-01 DIAGNOSIS — K8051 Calculus of bile duct without cholangitis or cholecystitis with obstruction: Principal | ICD-10-CM | POA: Diagnosis present

## 2023-09-01 DIAGNOSIS — L719 Rosacea, unspecified: Secondary | ICD-10-CM | POA: Diagnosis present

## 2023-09-01 DIAGNOSIS — Z85828 Personal history of other malignant neoplasm of skin: Secondary | ICD-10-CM

## 2023-09-01 DIAGNOSIS — Z9049 Acquired absence of other specified parts of digestive tract: Secondary | ICD-10-CM

## 2023-09-01 DIAGNOSIS — K219 Gastro-esophageal reflux disease without esophagitis: Secondary | ICD-10-CM | POA: Diagnosis present

## 2023-09-01 DIAGNOSIS — K859 Acute pancreatitis without necrosis or infection, unspecified: Secondary | ICD-10-CM | POA: Diagnosis not present

## 2023-09-01 DIAGNOSIS — E876 Hypokalemia: Secondary | ICD-10-CM | POA: Diagnosis present

## 2023-09-01 DIAGNOSIS — M722 Plantar fascial fibromatosis: Secondary | ICD-10-CM | POA: Diagnosis present

## 2023-09-01 DIAGNOSIS — A419 Sepsis, unspecified organism: Secondary | ICD-10-CM | POA: Diagnosis not present

## 2023-09-01 DIAGNOSIS — F32A Depression, unspecified: Secondary | ICD-10-CM | POA: Diagnosis present

## 2023-09-01 DIAGNOSIS — E611 Iron deficiency: Secondary | ICD-10-CM | POA: Diagnosis present

## 2023-09-01 LAB — COMPREHENSIVE METABOLIC PANEL
ALT: 100 U/L — ABNORMAL HIGH (ref 0–44)
AST: 108 U/L — ABNORMAL HIGH (ref 15–41)
Albumin: 4 g/dL (ref 3.5–5.0)
Alkaline Phosphatase: 283 U/L — ABNORMAL HIGH (ref 38–126)
Anion gap: 11 (ref 5–15)
BUN: 12 mg/dL (ref 6–20)
CO2: 24 mmol/L (ref 22–32)
Calcium: 8.8 mg/dL — ABNORMAL LOW (ref 8.9–10.3)
Chloride: 100 mmol/L (ref 98–111)
Creatinine, Ser: 0.71 mg/dL (ref 0.44–1.00)
GFR, Estimated: >60 mL/min (ref >60)
Glucose, Bld: 90 mg/dL (ref 70–99)
Potassium: 3.4 mmol/L — ABNORMAL LOW (ref 3.5–5.1)
Sodium: 135 mmol/L (ref 135–145)
Total Bilirubin: 3 mg/dL — ABNORMAL HIGH (ref 0.3–1.2)
Total Protein: 8.1 g/dL (ref 6.5–8.1)

## 2023-09-01 LAB — CBC
HCT: 38.3 % (ref 36.0–46.0)
Hemoglobin: 12.9 g/dL (ref 12.0–15.0)
MCH: 29.5 pg (ref 26.0–34.0)
MCHC: 33.7 g/dL (ref 30.0–36.0)
MCV: 87.6 fL (ref 80.0–100.0)
Platelets: 290 10*3/uL (ref 150–400)
RBC: 4.37 MIL/uL (ref 3.87–5.11)
RDW: 12 % (ref 11.5–15.5)
WBC: 10.9 10*3/uL — ABNORMAL HIGH (ref 4.0–10.5)
nRBC: 0 % (ref 0.0–0.2)

## 2023-09-01 LAB — URINALYSIS, ROUTINE W REFLEX MICROSCOPIC
Bilirubin Urine: NEGATIVE
Glucose, UA: NEGATIVE mg/dL
Hgb urine dipstick: NEGATIVE
Ketones, ur: NEGATIVE mg/dL
Nitrite: NEGATIVE
Protein, ur: NEGATIVE mg/dL
Specific Gravity, Urine: 1.021 (ref 1.005–1.030)
pH: 5 (ref 5.0–8.0)

## 2023-09-01 LAB — LIPASE, BLOOD: Lipase: 30 U/L (ref 11–51)

## 2023-09-01 MED ORDER — ONDANSETRON HCL 4 MG/2ML IJ SOLN
4.0000 mg | Freq: Once | INTRAMUSCULAR | Status: AC
  Administered 2023-09-01: 4 mg via INTRAVENOUS
  Filled 2023-09-01: qty 2

## 2023-09-01 MED ORDER — LOPERAMIDE HCL 2 MG PO CAPS
2.0000 mg | ORAL_CAPSULE | Freq: Once | ORAL | Status: AC
  Administered 2023-09-01: 2 mg via ORAL
  Filled 2023-09-01: qty 1

## 2023-09-01 MED ORDER — GADOBUTROL 1 MMOL/ML IV SOLN
7.0000 mL | Freq: Once | INTRAVENOUS | Status: AC | PRN
  Administered 2023-09-01: 7 mL via INTRAVENOUS

## 2023-09-01 MED ORDER — MORPHINE SULFATE (PF) 4 MG/ML IV SOLN
4.0000 mg | Freq: Once | INTRAVENOUS | Status: AC
  Administered 2023-09-01: 4 mg via INTRAVENOUS
  Filled 2023-09-01: qty 1

## 2023-09-01 MED ORDER — OXYCODONE-ACETAMINOPHEN 5-325 MG PO TABS
1.0000 | ORAL_TABLET | Freq: Once | ORAL | Status: AC
  Administered 2023-09-01: 1 via ORAL
  Filled 2023-09-01: qty 1

## 2023-09-01 NOTE — ED Triage Notes (Signed)
Pt in via POV, sent over from [facility].  Complaints of upper abdominal pain since Saturday, w/ ongoing diarrhea since Wed of last week.  Denies and N/V.    Per KC, elevated liver enzymes and elevated WBC: WBC 11.1 AST 78 ALT 87  Abmulatory to triage, vitals WDL, NAD noted at this time.

## 2023-09-01 NOTE — ED Provider Notes (Signed)
[facility] Emergency Department Provider Note     Event Date/Time   First MD Initiated Contact with Patient 09/01/23 1335     (approximate)   History   Abdominal Pain and Diarrhea   HPI  Bethany Flores is a 57 y.o. female with a history of anxiety, GERD and surgical history of cholecystectomy presents to the ED after being evaluated at [facility] clinic for elevated liver enzymes, leukocytosis and right upper abdominal pain for further evaluation.  Patient reports symptoms started x 2 days ago and has since persisted.  Associated symptoms includes persistent diarrhea since Wednesday. endorses nausea.  Denies fever and chills.    Physical Exam   Triage Vital Signs: ED Triage Vitals  Encounter Vitals Group     BP 09/01/23 1233 115/73     Systolic BP Percentile --      Diastolic BP Percentile --      Pulse Rate 09/01/23 1233 94     Resp 09/01/23 1233 16     Temp 09/01/23 1233 98.9 F (37.2 C)     Temp Source 09/01/23 1233 Oral     SpO2 09/01/23 1233 100 %     Weight 09/01/23 1237 178 lb (80.7 kg)     Height 09/01/23 1237 5\' 5"  (1.651 m)     Head Circumference --      Peak Flow --      Pain Score 09/01/23 1236 6     Pain Loc --      Pain Education --      Exclude from Growth Chart --     Most recent vital signs: Vitals:   09/01/23 1233 09/01/23 1649  BP: 115/73 118/70  Pulse: 94 90  Resp: 16 16  Temp: 98.9 F (37.2 C) 98 F (36.7 C)  SpO2: 100% 100%    General: Alert and oriented. INAD.  Skin:  Warm, dry and intact. No rashes or lesions noted.     Head:  NCAT.  Eyes:  PERRLA. EOMI.  CV:  Good peripheral perfusion. RRR.  RESP:  Normal effort. LCTAB.  ABD:  No distention. NBS. Soft.  Tenderness to palpation over right upper quadrant. MSK:   Full ROM in all joints. No swelling, deformity or tenderness.  NEURO: Cranial nerves No focal deficits.   ED Results / Procedures / Treatments   Labs (all labs ordered are listed, but only  abnormal results are displayed) Labs Reviewed  COMPREHENSIVE METABOLIC PANEL - Abnormal; Notable for the following components:      Result Value   Potassium 3.4 (*)    Calcium 8.8 (*)    AST 108 (*)    ALT 100 (*)    Alkaline Phosphatase 283 (*)    Total Bilirubin 3.0 (*)    All other components within normal limits  CBC - Abnormal; Notable for the following components:   WBC 10.9 (*)    All other components within normal limits  URINALYSIS, ROUTINE W REFLEX MICROSCOPIC - Abnormal; Notable for the following components:   Color, Urine AMBER (*)    APPearance HAZY (*)    Leukocytes,Ua TRACE (*)    Bacteria, UA RARE (*)    All other components within normal limits  LIPASE, BLOOD    EKG  Normal sinus rhythm  RADIOLOGY  I personally viewed and evaluated these images as part of my medical decision making, as well as reviewing the written report by the radiologist.  ED Provider Interpretation: Ultrasound appears normal,  no obvious stone noted will confirm with final radiology read.  US ABDOMEN LIMITED RUQ (LIVER/GB)  Result Date: 09/01/2023 CLINICAL DATA:  Upper abdominal pain Elevated liver function tests EXAM: ULTRASOUND ABDOMEN LIMITED RIGHT UPPER QUADRANT COMPARISON:  CT abdomen pelvis 11/13/2016 FINDINGS: Gallbladder: Surgically absent Common bile duct: Diameter: 9 mm Liver: Parenchymal echogenicity: Within normal limits Contours: Normal Lesions: None Portal vein: Patent.  Hepatopetal flow Other: None. IMPRESSION: No significant sonographic abnormality of the liver or bile ducts. Electronically Signed   By: Acquanetta Belling M.D.   On: 09/01/2023 16:05    PROCEDURES:  Critical Care performed: No  Procedures   MEDICATIONS ORDERED IN ED: Medications  oxyCODONE-acetaminophen (PERCOCET/ROXICET) 5-325 MG per tablet 1 tablet (1 tablet Oral Given 09/01/23 1420)  gadobutrol (GADAVIST) 1 MMOL/ML injection 7 mL (7 mLs Intravenous Contrast Given 09/01/23 1736)  morphine (PF) 4 MG/ML  injection 4 mg (4 mg Intravenous Given 09/01/23 1856)  ondansetron (ZOFRAN) injection 4 mg (4 mg Intravenous Given 09/01/23 1855)    IMPRESSION / MDM / ASSESSMENT AND PLAN / ED COURSE  I reviewed the triage vital signs and the nursing notes.                              Clinical Course as of 09/01/23 2003  Mon Sep 01, 2023  1544 Pain is easing off per patient [MH]  1629 GI consulted [MH]  1639 MRCP [MH]    Clinical Course User Index [MH] Kern Reap A, PA-C    57 y.o. female presents to the emergency department for evaluation and treatment of acute abdominal pain localized to right upper quadrant. See HPI for further details.   Differential diagnosis includes, but is not limited to choledocholithiasis, pancreatitis, cholangitis, gastroenteritis  Patient's presentation is most consistent with acute complicated illness / injury requiring diagnostic workup.  Patient is alert and oriented.  She is nontoxic and hemodynamically stable.  Exam findings are pertinent for tenderness over the right upper quadrant.  Labs are reassuring with the exception of mildly elevated leukocytosis and elevated liver enzymes.  Urinalysis reveals trace of leukocytes and bacteria.  More than likely contaminated.  Patient is asymptomatic.  Ultrasound reports no abnormalities of the liver or bile duct, liver given bile duct dilation of 9 mm, GI consultation indicated.  I discussed with Dr. Servando Snare for further recommendations and agreed MRCP and history of Cholecystectomy.   ED pain management initially with Percocet.  Pain improved some but then returned.  Patient administered morphine and Zofran.  Patient is comfortable at this time pending MRCP results.   Transferring care to Dr. Larinda Buttery.  Plan awaiting MRCP final read for appropriate disposition.   FINAL CLINICAL IMPRESSION(S) / ED DIAGNOSES   Final diagnoses:  Right upper quadrant pain   Rx / DC Orders   ED Discharge Orders     None       Note:   This document was prepared using Dragon voice recognition software and may include unintentional dictation errors.    Romeo Apple, Beau Vanduzer A, PA-C 09/01/23 Owens Shark, MD 09/02/23 [phone]

## 2023-09-01 NOTE — ED Notes (Signed)
See triage note  Presents with upper abd pain with some n/v/d   Sx's started on Saturday  Was sent over from [facility]

## 2023-09-01 NOTE — ED Notes (Signed)
Back from MRI  Family at bedside

## 2023-09-01 NOTE — ED Notes (Signed)
Pt ambulated to restroom and back to bed safely.

## 2023-09-01 NOTE — ED Provider Notes (Signed)
-----------------------------------------   8:02 PM on 09/01/2023 -----------------------------------------  Blood pressure 118/70, pulse 90, temperature 98 F (36.7 C), temperature source Oral, resp. rate 16, height 5\' 5"  (1.651 m), weight 80.7 kg, last menstrual period 04/24/2018, SpO2 100%.  Assuming care from [facility].  In short, Bethany Flores is a 57 y.o. female with a chief complaint of Abdominal Pain and Diarrhea .  Refer to the original H&P for additional details.  The current plan of care is to follow-up MRCP results for upper abdominal pain with diarrhea.  ----------------------------------------- 11:27 PM on 09/01/2023 ----------------------------------------- MRCP read is still pending at this time, I spoke with [facility] radiology around 9 PM to ensure study would be read tonight and they assured me it would.  Secretary with radiology had contacted the radiologist to ask for preliminary read, however this is still pending at this time.  Patient turned over to oncoming provider pending MRCP results.    Chesley Noon, MD 09/01/23 2328

## 2023-09-01 NOTE — ED Notes (Signed)
Patient unable to provide urine specimen at this time; labeled cup provided so that patient can obtain at next chance.  Advised to bring to first nurse if able to do so.

## 2023-09-01 NOTE — ED Notes (Signed)
ED Provider at bedside. 

## 2023-09-02 ENCOUNTER — Encounter
Admission: EM | Disposition: A | Discharge status: Home / Self Care | Payer: Self-pay | Source: Home / Self Care | Attending: Student

## 2023-09-02 ENCOUNTER — Inpatient Hospital Stay: Payer: BC Managed Care – PPO | Admitting: Anesthesiology

## 2023-09-02 ENCOUNTER — Inpatient Hospital Stay: Payer: BC Managed Care – PPO

## 2023-09-02 DIAGNOSIS — Z85828 Personal history of other malignant neoplasm of skin: Secondary | ICD-10-CM | POA: Diagnosis not present

## 2023-09-02 DIAGNOSIS — L719 Rosacea, unspecified: Secondary | ICD-10-CM | POA: Diagnosis present

## 2023-09-02 DIAGNOSIS — K831 Obstruction of bile duct: Secondary | ICD-10-CM | POA: Diagnosis present

## 2023-09-02 DIAGNOSIS — Z90710 Acquired absence of both cervix and uterus: Secondary | ICD-10-CM | POA: Diagnosis not present

## 2023-09-02 DIAGNOSIS — E876 Hypokalemia: Secondary | ICD-10-CM | POA: Diagnosis present

## 2023-09-02 DIAGNOSIS — F32A Depression, unspecified: Secondary | ICD-10-CM | POA: Diagnosis present

## 2023-09-02 DIAGNOSIS — E611 Iron deficiency: Secondary | ICD-10-CM | POA: Diagnosis present

## 2023-09-02 DIAGNOSIS — R7989 Other specified abnormal findings of blood chemistry: Secondary | ICD-10-CM | POA: Diagnosis present

## 2023-09-02 DIAGNOSIS — A419 Sepsis, unspecified organism: Secondary | ICD-10-CM | POA: Diagnosis not present

## 2023-09-02 DIAGNOSIS — K219 Gastro-esophageal reflux disease without esophagitis: Secondary | ICD-10-CM | POA: Diagnosis present

## 2023-09-02 DIAGNOSIS — Z9884 Bariatric surgery status: Secondary | ICD-10-CM | POA: Diagnosis not present

## 2023-09-02 DIAGNOSIS — E162 Hypoglycemia, unspecified: Secondary | ICD-10-CM | POA: Diagnosis not present

## 2023-09-02 DIAGNOSIS — R932 Abnormal findings on diagnostic imaging of liver and biliary tract: Secondary | ICD-10-CM

## 2023-09-02 DIAGNOSIS — Z79899 Other long term (current) drug therapy: Secondary | ICD-10-CM | POA: Diagnosis not present

## 2023-09-02 DIAGNOSIS — M722 Plantar fascial fibromatosis: Secondary | ICD-10-CM | POA: Diagnosis present

## 2023-09-02 DIAGNOSIS — F419 Anxiety disorder, unspecified: Secondary | ICD-10-CM | POA: Diagnosis present

## 2023-09-02 DIAGNOSIS — R1011 Right upper quadrant pain: Secondary | ICD-10-CM | POA: Diagnosis present

## 2023-09-02 DIAGNOSIS — Z9049 Acquired absence of other specified parts of digestive tract: Secondary | ICD-10-CM | POA: Diagnosis not present

## 2023-09-02 DIAGNOSIS — K859 Acute pancreatitis without necrosis or infection, unspecified: Secondary | ICD-10-CM | POA: Diagnosis not present

## 2023-09-02 DIAGNOSIS — M1990 Unspecified osteoarthritis, unspecified site: Secondary | ICD-10-CM | POA: Diagnosis present

## 2023-09-02 HISTORY — PX: ERCP: SHX5425

## 2023-09-02 LAB — CBC
HCT: 32.6 % — ABNORMAL LOW (ref 36.0–46.0)
HCT: 34.7 % — ABNORMAL LOW (ref 36.0–46.0)
Hemoglobin: 11.3 g/dL — ABNORMAL LOW (ref 12.0–15.0)
Hemoglobin: 12 g/dL (ref 12.0–15.0)
MCH: 29.6 pg (ref 26.0–34.0)
MCH: 29.6 pg (ref 26.0–34.0)
MCHC: 34.7 g/dL (ref 30.0–36.0)
MCHC: 34.6 g/dL (ref 30.0–36.0)
MCV: 85.3 fL (ref 80.0–100.0)
MCV: 85.7 fL (ref 80.0–100.0)
Platelets: 254 10*3/uL (ref 150–400)
Platelets: 299 10*3/uL (ref 150–400)
RBC: 3.82 MIL/uL — ABNORMAL LOW (ref 3.87–5.11)
RBC: 4.05 MIL/uL (ref 3.87–5.11)
RDW: 12 % (ref 11.5–15.5)
RDW: 12.1 % (ref 11.5–15.5)
WBC: 8.7 10*3/uL (ref 4.0–10.5)
WBC: 8 10*3/uL (ref 4.0–10.5)
nRBC: 0 % (ref 0.0–0.2)
nRBC: 0 % (ref 0.0–0.2)

## 2023-09-02 LAB — HEPATIC FUNCTION PANEL
ALT: 91 U/L — ABNORMAL HIGH (ref 0–44)
AST: 86 U/L — ABNORMAL HIGH (ref 15–41)
Albumin: 3.5 g/dL (ref 3.5–5.0)
Alkaline Phosphatase: 527 U/L — ABNORMAL HIGH (ref 38–126)
Bilirubin, Direct: 1.7 mg/dL — ABNORMAL HIGH (ref 0.0–0.2)
Indirect Bilirubin: 1.8 mg/dL — ABNORMAL HIGH (ref 0.3–0.9)
Total Bilirubin: 3.5 mg/dL — ABNORMAL HIGH (ref 0.3–1.2)
Total Protein: 7.4 g/dL (ref 6.5–8.1)

## 2023-09-02 LAB — COMPREHENSIVE METABOLIC PANEL
ALT: 85 U/L — ABNORMAL HIGH (ref 0–44)
AST: 64 U/L — ABNORMAL HIGH (ref 15–41)
Albumin: 3.5 g/dL (ref 3.5–5.0)
Alkaline Phosphatase: 306 U/L — ABNORMAL HIGH (ref 38–126)
Anion gap: 12 (ref 5–15)
BUN: 16 mg/dL (ref 6–20)
CO2: 22 mmol/L (ref 22–32)
Calcium: 8.4 mg/dL — ABNORMAL LOW (ref 8.9–10.3)
Chloride: 102 mmol/L (ref 98–111)
Creatinine, Ser: 0.77 mg/dL (ref 0.44–1.00)
GFR, Estimated: >60 mL/min (ref >60)
Glucose, Bld: 66 mg/dL — ABNORMAL LOW (ref 70–99)
Potassium: 3.6 mmol/L (ref 3.5–5.1)
Sodium: 136 mmol/L (ref 135–145)
Total Bilirubin: 2.6 mg/dL — ABNORMAL HIGH (ref 0.3–1.2)
Total Protein: 7.4 g/dL (ref 6.5–8.1)

## 2023-09-02 LAB — CBC WITH DIFFERENTIAL/PLATELET
Abs Immature Granulocytes: 0.04 10*3/uL (ref 0.00–0.07)
Basophils Absolute: 0 10*3/uL (ref 0.0–0.1)
Basophils Relative: 0 %
Eosinophils Absolute: 0 10*3/uL (ref 0.0–0.5)
Eosinophils Relative: 0 %
HCT: 34.1 % — ABNORMAL LOW (ref 36.0–46.0)
Hemoglobin: 11.8 g/dL — ABNORMAL LOW (ref 12.0–15.0)
Immature Granulocytes: 0 %
Lymphocytes Relative: 2 %
Lymphs Abs: 0.3 10*3/uL — ABNORMAL LOW (ref 0.7–4.0)
MCH: 29.6 pg (ref 26.0–34.0)
MCHC: 34.6 g/dL (ref 30.0–36.0)
MCV: 85.5 fL (ref 80.0–100.0)
Monocytes Absolute: 0.5 10*3/uL (ref 0.1–1.0)
Monocytes Relative: 5 %
Neutro Abs: 10.8 10*3/uL — ABNORMAL HIGH (ref 1.7–7.7)
Neutrophils Relative %: 93 %
Platelets: 266 10*3/uL (ref 150–400)
RBC: 3.99 MIL/uL (ref 3.87–5.11)
RDW: 11.9 % (ref 11.5–15.5)
WBC: 11.7 10*3/uL — ABNORMAL HIGH (ref 4.0–10.5)
nRBC: 0 % (ref 0.0–0.2)

## 2023-09-02 LAB — BASIC METABOLIC PANEL
Anion gap: 13 (ref 5–15)
BUN: 13 mg/dL (ref 6–20)
CO2: 21 mmol/L — ABNORMAL LOW (ref 22–32)
Calcium: 8.4 mg/dL — ABNORMAL LOW (ref 8.9–10.3)
Chloride: 101 mmol/L (ref 98–111)
Creatinine, Ser: 0.63 mg/dL (ref 0.44–1.00)
GFR, Estimated: >60 mL/min (ref >60)
Glucose, Bld: 108 mg/dL — ABNORMAL HIGH (ref 70–99)
Potassium: 3.3 mmol/L — ABNORMAL LOW (ref 3.5–5.1)
Sodium: 135 mmol/L (ref 135–145)

## 2023-09-02 LAB — PHOSPHORUS
Phosphorus: 3 mg/dL (ref 2.5–4.6)
Phosphorus: 3 mg/dL (ref 2.5–4.6)

## 2023-09-02 LAB — GLUCOSE, CAPILLARY
Glucose-Capillary: 79 mg/dL (ref 70–99)
Glucose-Capillary: 89 mg/dL (ref 70–99)

## 2023-09-02 LAB — MAGNESIUM
Magnesium: 1.8 mg/dL (ref 1.7–2.4)
Magnesium: 1.7 mg/dL (ref 1.7–2.4)

## 2023-09-02 LAB — LIPASE, BLOOD: Lipase: 28 U/L (ref 11–51)

## 2023-09-02 LAB — HIV ANTIBODY (ROUTINE TESTING W REFLEX): HIV Screen 4th Generation wRfx: NONREACTIVE

## 2023-09-02 SURGERY — ERCP, WITH INTERVENTION IF INDICATED
Anesthesia: General

## 2023-09-02 MED ORDER — SODIUM CHLORIDE 0.9 % IV BOLUS (SEPSIS)
1000.0000 mL | Freq: Once | INTRAVENOUS | Status: AC
  Administered 2023-09-03: 1000 mL via INTRAVENOUS

## 2023-09-02 MED ORDER — ONDANSETRON HCL 4 MG/2ML IJ SOLN: 4.0000 mg | Freq: Four times a day (QID) | INTRAMUSCULAR | Status: DC | PRN

## 2023-09-02 MED ORDER — DEXTROSE 50 % IV SOLN
12.5000 g | INTRAVENOUS | Status: AC
  Administered 2023-09-02: 12.5 g via INTRAVENOUS
  Filled 2023-09-02: qty 50

## 2023-09-02 MED ORDER — DICLOFENAC SUPPOSITORY 100 MG
RECTAL | Status: AC
  Filled 2023-09-02: qty 1

## 2023-09-02 MED ORDER — PROPOFOL 10 MG/ML IV BOLUS
INTRAVENOUS | Status: AC
  Filled 2023-09-02: qty 20

## 2023-09-02 MED ORDER — SODIUM CHLORIDE 0.9 % IV SOLN
2.0000 g | Freq: Once | INTRAVENOUS | Status: AC
  Administered 2023-09-03: 2 g via INTRAVENOUS
  Filled 2023-09-02: qty 12.5

## 2023-09-02 MED ORDER — HYDROMORPHONE HCL 1 MG/ML IJ SOLN: 0.5000 mg | INTRAMUSCULAR | Status: DC | PRN

## 2023-09-02 MED ORDER — METRONIDAZOLE 500 MG/100ML IV SOLN
500.0000 mg | Freq: Two times a day (BID) | INTRAVENOUS | Status: DC
  Administered 2023-09-02 – 2023-09-05 (×6): 500 mg via INTRAVENOUS
  Filled 2023-09-02 (×6): qty 100

## 2023-09-02 MED ORDER — MIDAZOLAM HCL 5 MG/5ML IJ SOLN
INTRAMUSCULAR | Status: DC | PRN
Start: 2023-09-02 — End: 2023-09-02
  Administered 2023-09-02: 1 mg via INTRAVENOUS

## 2023-09-02 MED ORDER — DEXTROSE-SODIUM CHLORIDE 5-0.9 % IV SOLN
INTRAVENOUS | Status: DC
Start: 2023-09-02 — End: 2023-09-02

## 2023-09-02 MED ORDER — PANTOPRAZOLE SODIUM 40 MG IV SOLR
40.0000 mg | Freq: Two times a day (BID) | INTRAVENOUS | Status: DC
  Administered 2023-09-02 – 2023-09-05 (×6): 40 mg via INTRAVENOUS
  Filled 2023-09-02 (×6): qty 10

## 2023-09-02 MED ORDER — DICLOFENAC SUPPOSITORY 100 MG: 100.0000 mg | Freq: Once | RECTAL | Status: DC

## 2023-09-02 MED ORDER — HYDROCODONE-ACETAMINOPHEN 5-325 MG PO TABS
1.0000 | ORAL_TABLET | ORAL | Status: DC | PRN
  Administered 2023-09-02: 2 via ORAL
  Filled 2023-09-02: qty 2

## 2023-09-02 MED ORDER — ENOXAPARIN SODIUM 40 MG/0.4ML IJ SOSY
40.0000 mg | PREFILLED_SYRINGE | Freq: Every evening | INTRAMUSCULAR | Status: DC
  Administered 2023-09-03 – 2023-09-04 (×2): 40 mg via SUBCUTANEOUS
  Filled 2023-09-02 (×2): qty 0.4

## 2023-09-02 MED ORDER — SODIUM CHLORIDE 0.9 % IV SOLN
2.0000 g | Freq: Three times a day (TID) | INTRAVENOUS | Status: DC
  Administered 2023-09-03 – 2023-09-05 (×7): 2 g via INTRAVENOUS
  Filled 2023-09-02 (×8): qty 12.5

## 2023-09-02 MED ORDER — ONDANSETRON HCL 4 MG PO TABS: 4.0000 mg | ORAL_TABLET | Freq: Four times a day (QID) | ORAL | Status: DC | PRN

## 2023-09-02 MED ORDER — PROPOFOL 500 MG/50ML IV EMUL
INTRAVENOUS | Status: DC | PRN
  Administered 2023-09-02: 100 ug/kg/min via INTRAVENOUS

## 2023-09-02 MED ORDER — PROPOFOL 10 MG/ML IV BOLUS
INTRAVENOUS | Status: DC | PRN
  Administered 2023-09-02: 100 mg via INTRAVENOUS

## 2023-09-02 MED ORDER — MORPHINE SULFATE (PF) 2 MG/ML IV SOLN
2.0000 mg | INTRAVENOUS | Status: DC | PRN
  Administered 2023-09-02: 2 mg via INTRAVENOUS
  Filled 2023-09-02: qty 1

## 2023-09-02 MED ORDER — LACTATED RINGERS IV SOLN
150.0000 mL/h | INTRAVENOUS | Status: AC
  Administered 2023-09-02 – 2023-09-03 (×2): 150 mL/h via INTRAVENOUS

## 2023-09-02 MED ORDER — MORPHINE SULFATE (PF) 4 MG/ML IV SOLN
4.0000 mg | Freq: Once | INTRAVENOUS | Status: AC
  Administered 2023-09-02: 4 mg via INTRAVENOUS
  Filled 2023-09-02: qty 1

## 2023-09-02 MED ORDER — SERTRALINE HCL 50 MG PO TABS
50.0000 mg | ORAL_TABLET | ORAL | Status: DC
  Administered 2023-09-03 – 2023-09-05 (×3): 50 mg via ORAL
  Filled 2023-09-02 (×3): qty 1

## 2023-09-02 MED ORDER — DEXTROSE 50 % IV SOLN: 1.0000 | Freq: Once | INTRAVENOUS | Status: DC

## 2023-09-02 MED ORDER — ACETAMINOPHEN 650 MG RE SUPP: 650.0000 mg | Freq: Four times a day (QID) | RECTAL | Status: DC | PRN

## 2023-09-02 MED ORDER — MIDAZOLAM HCL 2 MG/2ML IJ SOLN
INTRAMUSCULAR | Status: AC
  Filled 2023-09-02: qty 2

## 2023-09-02 MED ORDER — DEXTROSE-SODIUM CHLORIDE 5-0.9 % IV SOLN: INTRAVENOUS | Status: AC

## 2023-09-02 MED ORDER — IOHEXOL 300 MG/ML  SOLN
INTRAMUSCULAR | Status: DC | PRN
Start: 2023-09-02 — End: 2023-09-02
  Administered 2023-09-02: 10 mL

## 2023-09-02 MED ORDER — LIDOCAINE HCL (CARDIAC) PF 100 MG/5ML IV SOSY
PREFILLED_SYRINGE | INTRAVENOUS | Status: DC | PRN
  Administered 2023-09-02: 80 mg via INTRAVENOUS

## 2023-09-02 MED ORDER — ACETAMINOPHEN 325 MG PO TABS
650.0000 mg | ORAL_TABLET | Freq: Four times a day (QID) | ORAL | Status: DC | PRN
  Administered 2023-09-02 – 2023-09-05 (×3): 650 mg via ORAL
  Filled 2023-09-02 (×3): qty 2

## 2023-09-02 MED ORDER — LACTATED RINGERS IV SOLN: INTRAVENOUS | Status: DC

## 2023-09-02 NOTE — Consult Note (Signed)
Consultation  Referring Provider:  Hospitalist    Admit date: 09/02/2023 Consult date: 09/02/2023         Reason for Consultation: Abnormal imaging              HPI:   Bethany Flores is a 57 y.o. lady with history of gastric sleeve, depression, and anxiety who presented with abdominal pain and MRCP imaging suggestive of artifact vs stones/sludge. She underwent ERCP this morning with sludge removed. She did develop some post-ERCP abdominal pain for which she is being evaluated for possible pancreatitis. She had an elevated T. Bili and alk phos consistent with obstruction.  Past Medical History:  Diagnosis Date   Actinic keratosis    Anemia    Anxiety    Arthritis    Atypical mole 08/22/2010   Left back    DDD (degenerative disc disease), lumbar    Depression    Diverticulosis    Dysplastic nevus 08/26/2022   R shoulder inf edge of scar, moderate atypia   GERD (gastroesophageal reflux disease)    OCC-TUMS PRN   History of carpal tunnel syndrome    Plantar fasciitis    Squamous cell carcinoma of skin 08/09/2019   right shoulder  (SCCIS)   Squamous cell carcinoma of skin 08/26/2022   SCC IS, R shoulder inf edge of scar, EDC, also txted with 5FU/Calcipotriene    Past Surgical History:  Procedure Laterality Date   ABLATION ON ENDOMETRIOSIS  05/04/2018   Procedure: EXCISION OF ENDOMETRIOSIS;  Surgeon: Christeen Douglas, MD;  Location: [facility] ORS;  Service: Gynecology;;   BACK SURGERY  02/16/2018   LOWER LAMINECTOMY   CHOLECYSTECTOMY N/A 04/26/2015   Procedure: LAPAROSCOPIC CHOLECYSTECTOMY WITH INTRAOPERATIVE CHOLANGIOGRAM;  Surgeon: Duwaine Maxin, MD;  Location: [facility] ORS;  Service: General;  Laterality: N/A;   COLONOSCOPY WITH PROPOFOL N/A 02/26/2016   Procedure: COLONOSCOPY WITH PROPOFOL;  Surgeon: Christena Deem, MD;  Location: [facility] ENDOSCOPY;  Service: Endoscopy;  Laterality: N/A;   CYSTOSCOPY  05/04/2018   Procedure: CYSTOSCOPY;  Surgeon: Christeen Douglas, MD;  Location:  [facility] ORS;  Service: Gynecology;;   ERCP N/A 11/15/2016   Procedure: ENDOSCOPIC RETROGRADE CHOLANGIOPANCREATOGRAPHY (ERCP);  Surgeon: Midge Minium, MD;  Location: [facility] ENDOSCOPY;  Service: Endoscopy;  Laterality: N/A;  Inpatient RM [id number]   ERCP N/A 01/14/2017   Procedure: ENDOSCOPIC RETROGRADE CHOLANGIOPANCREATOGRAPHY (ERCP);  Surgeon: Midge Minium, MD;  Location: [facility] ENDOSCOPY;  Service: Endoscopy;  Laterality: N/A;   ESOPHAGOGASTRODUODENOSCOPY N/A 11/03/2018   Procedure: ESOPHAGOGASTRODUODENOSCOPY (EGD);  Surgeon: Christena Deem, MD;  Location: [facility] ENDOSCOPY;  Service: Endoscopy;  Laterality: N/A;   Laminectomy posterior lumbar      LAPAROSCOPIC BILATERAL SALPINGECTOMY Bilateral 05/04/2018   Procedure: LAPAROSCOPIC BILATERAL SALPINGECTOMY;  Surgeon: Christeen Douglas, MD;  Location: [facility] ORS;  Service: Gynecology;  Laterality: Bilateral;   LAPAROSCOPIC GASTRIC SLEEVE RESECTION     LAPAROSCOPIC HYSTERECTOMY N/A 05/04/2018   Procedure: HYSTERECTOMY TOTAL LAPAROSCOPIC;  Surgeon: Christeen Douglas, MD;  Location: [facility] ORS;  Service: Gynecology;  Laterality: N/A;   MICRODISCECTOMY LUMBAR      Family History  Problem Relation Age of Onset   Diabetes Paternal Grandmother    Diabetes Paternal Grandfather    Breast cancer Neg Hx     Social History   Tobacco Use   Smoking status: Never   Smokeless tobacco: Never  Vaping Use   Vaping status: Never Used  Substance Use Topics   Alcohol use: Yes   Drug use: No    Prior to Admission medications  Medication Sig Start Date End Date Taking? Authorizing Provider  acetaminophen (TYLENOL) 500 MG tablet Take 1,000 mg by mouth every 6 (six) hours as needed.    [provider]  Calcium Carbonate-Vitamin D 600-200 MG-UNIT TABS Take 1 tablet by mouth daily with lunch.     [provider]  metroNIDAZOLE (METROCREAM) 0.75 % cream Apply to mid face 1-2 times a day for rosacea. 08/20/21   [name], MD  Multiple Vitamins-Minerals  (MULTIVITAMIN ADULT) TABS Take 1 tablet by mouth daily.    [provider]  Omega-3 Fatty Acids (FISH OIL) 1000 MG CAPS Take by mouth.    [provider]  sertraline (ZOLOFT) 50 MG tablet Take 50 mg by mouth every morning.     [provider]    Current Facility-Administered Medications  Medication Dose Route Frequency Provider Last Rate Last Admin   acetaminophen (TYLENOL) tablet 650 mg  650 mg Oral Q6H PRN Andris Baumann, MD       Or   acetaminophen (TYLENOL) suppository 650 mg  650 mg Rectal Q6H PRN Andris Baumann, MD       dextrose 5 %-0.9 % sodium chloride infusion   Intravenous Continuous Gillis Santa, MD 100 mL/hr at 09/02/23 1233 New Bag at 09/02/23 1233   HYDROcodone-acetaminophen (NORCO/VICODIN) 5-325 MG per tablet 1-2 tablet  1-2 tablet Oral Q4H PRN Andris Baumann, MD   2 tablet at 09/02/23 1303   morphine (PF) 2 MG/ML injection 2 mg  2 mg Intravenous Q2H PRN Andris Baumann, MD   2 mg at 09/02/23 1236   ondansetron (ZOFRAN) tablet 4 mg  4 mg Oral Q6H PRN Andris Baumann, MD       Or   ondansetron [facility]) injection 4 mg  4 mg Intravenous Q6H PRN Andris Baumann, MD        Allergies as of 09/01/2023   (No Known Allergies)     Review of Systems:    All systems reviewed and negative except where noted in HPI.  Review of Systems  Constitutional:  Negative for chills and fever.  Respiratory:  Negative for shortness of breath.   Cardiovascular:  Negative for chest pain.  Gastrointestinal:  Positive for abdominal pain.  Musculoskeletal:  Negative for joint pain.  Skin:  Negative for rash.  Neurological:  Negative for focal weakness.  Psychiatric/Behavioral:  Negative for substance abuse.        Physical Exam:  Vital signs in last 24 hours: Temp:  [97.8 F (36.6 C)-98.5 F (36.9 C)] 98.3 F (36.8 C) (10/15 1256) Pulse Rate:  [76-90] 84 (10/15 1256) Resp:  [15-18] 16 (10/15 1256) BP: (93-118)/(56-70) 111/56 (10/15 1256) SpO2:  [96  %-100 %] 99 % (10/15 1256) Last BM Date : 09/02/23 General:   In mild distress Head:  Normocephalic and atraumatic. Eyes:   No icterus.   Conjunctiva pink. Ears:  Normal auditory acuity. Mouth: Mucosa pink moist, no lesions. Neck:  Supple; no masses felt Lungs: No respiratory distress Abdomen:   Flat, soft, nondistended, mild tenderness to palpation Rectal:  Not performed.  Msk:  Strength 5/5. Symmetrical without gross deformities. Neurologic:  Alert and  oriented x4;  No focal deficits Skin:  Warm, dry, pink without significant lesions or rashes. Psych:  Alert and cooperative. Normal affect.  LAB RESULTS: Recent Labs    09/01/23 1243 09/02/23 0345  WBC 10.9* 8.7  HGB 12.9 11.3*  HCT 38.3 32.6*  PLT 290 254   BMET Recent  Labs    09/01/23 1243 09/02/23 0345  NA 135 136  K 3.4* 3.6  CL 100 102  CO2 24 22  GLUCOSE 90 66*  BUN 12 16  CREATININE 0.71 0.77  CALCIUM 8.8* 8.4*   LFT Recent Labs    09/02/23 0345  PROT 7.4  ALBUMIN 3.5  AST 64*  ALT 85*  ALKPHOS 306*  BILITOT 2.6*   PT/INR No results for input(s): "LABPROT", "INR" in the last 72 hours.  STUDIES: DG C-Arm 1-60 Min-No Report  Result Date: 09/02/2023 Fluoroscopy was utilized by the requesting physician.  No radiographic interpretation.   MR ABDOMEN MRCP W WO CONTAST  Result Date: 09/01/2023 CLINICAL DATA:  Cholelithiasis. Upper abdominal pain with elevated liver function studies. EXAM: MRI ABDOMEN WITHOUT AND WITH CONTRAST (INCLUDING MRCP) TECHNIQUE: Multiplanar multisequence MR imaging of the abdomen was performed both before and after the administration of intravenous contrast. Heavily T2-weighted images of the biliary and pancreatic ducts were obtained, and three-dimensional MRCP images were rendered by post processing. CONTRAST:  7mL GADAVIST GADOBUTROL 1 MMOL/ML IV SOLN COMPARISON:  Ultrasound abdomen 09/01/2023. CT abdomen and pelvis 11/13/2016 FINDINGS: Lower chest: No acute findings.  Hepatobiliary: The gallbladder is surgically absent. There is mild intra and extrahepatic bile duct dilatation, likely normal for postoperative physiology. Poorly defined filling defects demonstrated in the cystic duct and common bile ducts, most likely artifact but retained stone or sludge is possible. Consider ERCP for further evaluation if clinically indicated. No focal liver lesions are identified. Pancreas: No mass, inflammatory changes, or other parenchymal abnormality identified. Spleen:  Within normal limits in size and appearance. Adrenals/Urinary Tract: No masses identified. No evidence of hydronephrosis. Stomach/Bowel: Visualized portions within the abdomen are unremarkable. Vascular/Lymphatic: No pathologically enlarged lymph nodes identified. No abdominal aortic aneurysm demonstrated. Other:  No upper abdominal ascites or free air is identified. Musculoskeletal: No suspicious bone lesions identified. IMPRESSION: 1. Surgical absence of the gallbladder. 2. Mild intrahepatic and extrahepatic bile duct dilatation is likely normal for postoperative physiology. 3. Poorly defined filling defects are seen in the cystic duct and common bile duct, most likely artifact but could indicate retained stone or sludge. Consider ERCP for further evaluation if clinically indicated. Electronically Signed   By: Burman Nieves M.D.   On: 09/01/2023 23:53   MR 3D Recon At Scanner  Result Date: 09/01/2023 CLINICAL DATA:  Cholelithiasis. Upper abdominal pain with elevated liver function studies. EXAM: MRI ABDOMEN WITHOUT AND WITH CONTRAST (INCLUDING MRCP) TECHNIQUE: Multiplanar multisequence MR imaging of the abdomen was performed both before and after the administration of intravenous contrast. Heavily T2-weighted images of the biliary and pancreatic ducts were obtained, and three-dimensional MRCP images were rendered by post processing. CONTRAST:  7mL GADAVIST GADOBUTROL 1 MMOL/ML IV SOLN COMPARISON:  Ultrasound  abdomen 09/01/2023. CT abdomen and pelvis 11/13/2016 FINDINGS: Lower chest: No acute findings. Hepatobiliary: The gallbladder is surgically absent. There is mild intra and extrahepatic bile duct dilatation, likely normal for postoperative physiology. Poorly defined filling defects demonstrated in the cystic duct and common bile ducts, most likely artifact but retained stone or sludge is possible. Consider ERCP for further evaluation if clinically indicated. No focal liver lesions are identified. Pancreas: No mass, inflammatory changes, or other parenchymal abnormality identified. Spleen:  Within normal limits in size and appearance. Adrenals/Urinary Tract: No masses identified. No evidence of hydronephrosis. Stomach/Bowel: Visualized portions within the abdomen are unremarkable. Vascular/Lymphatic: No pathologically enlarged lymph nodes identified. No abdominal aortic aneurysm demonstrated. Other:  No upper abdominal ascites  or free air is identified. Musculoskeletal: No suspicious bone lesions identified. IMPRESSION: 1. Surgical absence of the gallbladder. 2. Mild intrahepatic and extrahepatic bile duct dilatation is likely normal for postoperative physiology. 3. Poorly defined filling defects are seen in the cystic duct and common bile duct, most likely artifact but could indicate retained stone or sludge. Consider ERCP for further evaluation if clinically indicated. Electronically Signed   By: Burman Nieves M.D.   On: 09/01/2023 23:53   US ABDOMEN LIMITED RUQ (LIVER/GB)  Result Date: 09/01/2023 CLINICAL DATA:  Upper abdominal pain Elevated liver function tests EXAM: ULTRASOUND ABDOMEN LIMITED RIGHT UPPER QUADRANT COMPARISON:  CT abdomen pelvis 11/13/2016 FINDINGS: Gallbladder: Surgically absent Common bile duct: Diameter: 9 mm Liver: Parenchymal echogenicity: Within normal limits Contours: Normal Lesions: None Portal vein: Patent.  Hepatopetal flow Other: None. IMPRESSION: No significant sonographic  abnormality of the liver or bile ducts. Electronically Signed   By: Acquanetta Belling M.D.   On: 09/01/2023 16:05       Impression / Plan:   57 y/o lady with clinical and imaging findings consistent with obstruction despite history of cholecystectomy. S/P ERCP with sludge removed. Having post-procedural pain, potentially pancreatitis  - await lipase lab, if normal then would get CT scan. If elevated then would treat patient as having pancreatitis - IV fluids - clears - pain control - will continue to follow, please call with any questions or concerns.  Merlyn Lot MD, MPH [facility]

## 2023-09-02 NOTE — Progress Notes (Signed)
CROSS COVER NOTE  NAME: Bethany Flores MRN: [id number] DOB : 02-12-1966    Concern as stated by nurse / staff   admit on 10/14 with ABD pain, had an ERCP today, sludge removed, Pt does not have a gall bladder. Pt had severe pain after the procedure and was given Morphine for pain. Tonight the Pt has chills and feeling hot with a Temp of 100.8, at 20:30, Tylenol given, recheck at 22:00 Temp 102.1 and still chills and then feeling hot. Pt stated no pain but feels tired. Please advise. Thanks.      Pertinent findings on chart review: Today's progress note reviewed.  Patient with intractable abdominal pain s/p MRCP earlier in the day    09/02/2023    8:16 PM 09/02/2023    2:00 PM 09/02/2023   12:56 PM  Vitals with BMI  Systolic 103 105 601  Diastolic 62 62 56  Pulse 108 85 84   Physical Exam Vitals and nursing note reviewed.  Constitutional:      General: She is not in acute distress. HENT:     Head: Normocephalic and atraumatic.  Cardiovascular:     Rate and Rhythm: Normal rate and regular rhythm.     Heart sounds: Normal heart sounds.  Pulmonary:     Effort: Pulmonary effort is normal.     Breath sounds: Normal breath sounds.  Abdominal:     Palpations: Abdomen is soft.     Tenderness: There is no abdominal tenderness.  Neurological:     Mental Status: Mental status is at baseline.       Assessment and  Interventions   A   Workup    Latest Ref Rng & Units 09/03/2023    2:31 AM 09/02/2023   11:35 PM 09/02/2023    1:58 PM  Hepatic Function  Total Protein 6.5 - 8.1 g/dL 6.5  7.4  7.4   Albumin 3.5 - 5.0 g/dL 2.9  3.5  3.5   AST 15 - 41 U/L 75  97  86   ALT 0 - 44 U/L 98  116  91   Alk Phosphatase 38 - 126 U/L 498  607  527   Total Bilirubin 0.3 - 1.2 mg/dL 4.5  5.2  3.5   Bilirubin, Direct 0.0 - 0.2 mg/dL 2.3   1.7       Latest Ref Rng & Units 09/03/2023    2:31 AM 09/02/2023   11:35 PM 09/02/2023    1:58 PM  CBC  WBC 4.0 - 10.5 K/uL 10.8  11.7   8.0   Hemoglobin 12.0 - 15.0 g/dL 09.3  23.5  57.3   Hematocrit 36.0 - 46.0 % 31.1  34.1  34.7   Platelets 150 - 400 K/uL 248  266  299    Lactic Acid, Venous    Component Value Date/Time   LATICACIDVEN 1.2 09/03/2023 0059   Procalcitonin 3.96     Component Value Date/Time   LIPASE 28 09/02/2023 2335   LIPASE 79 05/27/2013 0820   CT abdomen and pelvis with contrast 09/03/2023 1. No acute localizing process in the abdomen or pelvis. 2. Status post cholecystectomy with mild prominence of the common bile duct and intrahepatic bile ducts, similar to prior. 3. Small amount of pneumobilia likely related to recent ERCP.   Assessment and plan Assessment:  Possible sepsis CT negative for procedural complication  Plan: Sepsis bolus, fluids, cefepime and Flagyl started Pain control GI follow-up in the  a.m.    CRITICAL CARE Performed by: Andris Baumann   Total critical care time: 70 minutes  Critical care time was exclusive of separately billable procedures and treating other patients.  Critical care was necessary to treat or prevent imminent or life-threatening deterioration.  Critical care was time spent personally by me on the following activities: development of treatment plan with patient and/or surrogate as well as nursing, discussions with consultants, evaluation of patient's response to treatment, examination of patient, obtaining history from patient or surrogate, ordering and performing treatments and interventions, ordering and review of laboratory studies, ordering and review of radiographic studies, pulse oximetry and re-evaluation of patient's condition.

## 2023-09-02 NOTE — Op Note (Signed)
[facility] Gastroenterology Patient Name: Bethany Flores Procedure Date: 09/02/2023 8:59 AM MRN: [id number] Account #: [id number] Date of Birth: 28-Aug-1966 Admit Type: Inpatient Age: 57 Room: [facility] ENDO ROOM 4 Gender: Female Note Status: Finalized Instrument Name: Nolon Stalls [id number] Procedure:             ERCP Indications:           Abnormal MRCP Providers:             Midge Minium MD, MD Referring MD:          Teena Irani. Terance Hart, MD (Referring MD) Medicines:             Propofol per Anesthesia Complications:         No immediate complications. Procedure:             Pre-Anesthesia Assessment:                        - Prior to the procedure, a History and Physical was                         performed, and patient medications and allergies were                         reviewed. The patient's tolerance of previous                         anesthesia was also reviewed. The risks and benefits                         of the procedure and the sedation options and risks                         were discussed with the patient. All questions were                         answered, and informed consent was obtained. Prior                         Anticoagulants: The patient has taken no anticoagulant                         or antiplatelet agents. ASA Grade Assessment: II - A                         patient with mild systemic disease. After reviewing                         the risks and benefits, the patient was deemed in                         satisfactory condition to undergo the procedure.                        After obtaining informed consent, the scope was passed                         under direct vision. Throughout the procedure, the  patient's blood pressure, pulse, and oxygen                         saturations were monitored continuously. The                         Duodenoscope was introduced through the mouth, and                          used to inject contrast into and used to inject                         contrast into the bile duct. The ERCP was accomplished                         without difficulty. The patient tolerated the                         procedure well. Findings:      A scout film of the abdomen was obtained. Surgical clips, consistent       with a previous cholecystectomy, were seen in the area of the right       upper quadrant of the abdomen. , The major papilla was located entirely       within a diverticulum. The bile duct was deeply cannulated with the       short-nosed traction sphincterotome. Contrast was injected. I personally       interpreted the bile duct images. There was brisk flow of contrast       through the ducts. Image quality was excellent. Contrast extended to the       entire biliary tree. A wire was passed into the biliary tree. The       biliary sphincterotomy was extended to a total of 5 mm in length with a       traction (standard) sphincterotome using ERBE electrocautery. There was       no post-sphincterotomy bleeding. To discover objects, the biliary tree       was swept with a 15 mm balloon starting at the bifurcation. Sludge was       swept from the duct. Impression:            - The major papilla was located entirely within a                         diverticulum.                        - A biliary sphincterotomy was performed.                        - The biliary tree was swept and sludge was found. Recommendation:        - Return patient to hospital ward for ongoing care.                        - Resume regular diet.                        - Continue present medications.                        -  Watch for pancreatitis, bleeding, perforation, and                         cholangitis. Procedure Code(s):     --- Professional ---                        [phone], Endoscopic retrograde cholangiopancreatography                         (ERCP); with removal of calculi/debris from                          biliary/pancreatic duct(s)                        43262, Endoscopic retrograde cholangiopancreatography                         (ERCP); with sphincterotomy/papillotomy                        [phone], Endoscopic catheterization of the biliary                         ductal system, radiological supervision and                         interpretation Diagnosis Code(s):     --- Professional ---                        R93.2, Abnormal findings on diagnostic imaging of                         liver and biliary tract CPT copyright 2022 American Medical Association. All rights reserved. The codes documented in this report are preliminary and upon coder review may  be revised to meet current compliance requirements. Midge Minium MD, MD 09/02/2023 11:16:14 AM This report has been signed electronically. Number of Addenda: 0 Note Initiated On: 09/02/2023 8:59 AM Estimated Blood Loss:  Estimated blood loss: none.      [facility]

## 2023-09-02 NOTE — Transfer of Care (Signed)
Immediate Anesthesia Transfer of Care Note  Patient: Bethany Flores  Procedure(s) Performed: ENDOSCOPIC RETROGRADE CHOLANGIOPANCREATOGRAPHY (ERCP)  Patient Location: PACU  Anesthesia Type:General  Level of Consciousness: awake, alert , and oriented  Airway & Oxygen Therapy: Patient Spontanous Breathing  Post-op Assessment: Report given to RN and Post -op Vital signs reviewed and stable  Post vital signs: Reviewed and stable  Last Vitals: Pt awake and talking to staff.  Vitals Value Taken Time  BP 102/63   Temp 97.8   Pulse 85 09/02/23 1121  Resp 16   SpO2 98 % 09/02/23 1121  Vitals shown include unfiled device data.  Last Pain:  Vitals:   09/02/23 0800  TempSrc:   PainSc: 2       Patients Stated Pain Goal: 2 (09/02/23 0800)  Complications: No notable events documented.

## 2023-09-02 NOTE — Care Plan (Signed)
Re-evaluated patient. No elevation in lipase and pain has resolved. Possibly gas pain that she was experiencing. Will hold off on any imaging at this time. If pain recurs then would recommend CT abdomen.  Merlyn Lot MD, MPH [facility]

## 2023-09-02 NOTE — Progress Notes (Signed)
CODE SEPSIS - PHARMACY COMMUNICATION  **Broad Spectrum Antibiotics should be administered within 1 hour of Sepsis diagnosis**  Time Code Sepsis Called/Page Received: 2321  Antibiotics Ordered: Cefepime & Flagyl  Time of 1st antibiotic administration: 2350  Otelia Sergeant, PharmD, [facility] 09/02/2023 11:34 PM

## 2023-09-02 NOTE — Sepsis Progress Note (Signed)
Elink monitoring for the code sepsis protocol.  

## 2023-09-02 NOTE — Anesthesia Postprocedure Evaluation (Signed)
Anesthesia Post Note  Patient: JERILYN GILLASPIE  Procedure(s) Performed: ENDOSCOPIC RETROGRADE CHOLANGIOPANCREATOGRAPHY (ERCP)  Patient location during evaluation: Endoscopy Anesthesia Type: General Level of consciousness: awake and alert Pain management: pain level controlled Vital Signs Assessment: post-procedure vital signs reviewed and stable Respiratory status: spontaneous breathing, nonlabored ventilation, respiratory function stable and patient connected to nasal cannula oxygen Cardiovascular status: blood pressure returned to baseline and stable Postop Assessment: no apparent nausea or vomiting Anesthetic complications: no   No notable events documented.   Last Vitals:  Vitals:   09/02/23 0700 09/02/23 1121  BP: 96/61 102/60  Pulse: 77 86  Resp: 16 16  Temp: 36.8 C 36.6 C  SpO2: 100%     Last Pain:  Vitals:   09/02/23 1121  TempSrc: Temporal  PainSc: 1                  Corinda Gubler

## 2023-09-02 NOTE — Anesthesia Preprocedure Evaluation (Signed)
Anesthesia Evaluation  Patient identified by MRN, date of birth, ID band Patient awake  General Assessment Comment:  Came in with abdominal pain and diarrhea. Denies nausea or vomiting. Hx prior ERCPs.  Reviewed: Allergy & Precautions, NPO status , Patient's Chart, lab work & pertinent test results  History of Anesthesia Complications Negative for: history of anesthetic complications  Airway Mallampati: II  TM Distance: >3 FB Neck ROM: Full    Dental no notable dental hx. (+) Teeth Intact   Pulmonary neg pulmonary ROS, neg sleep apnea, neg COPD, Patient abstained from smoking.Not current smoker   Pulmonary exam normal breath sounds clear to auscultation       Cardiovascular Exercise Tolerance: Good METS(-) hypertension(-) CAD and (-) Past MI negative cardio ROS (-) dysrhythmias  Rhythm:Regular Rate:Normal - Systolic murmurs    Neuro/Psych  PSYCHIATRIC DISORDERS Anxiety Depression    negative neurological ROS     GI/Hepatic ,GERD  Controlled,,(+)     (-) substance abuse    Endo/Other  neg diabetes    Renal/GU negative Renal ROS     Musculoskeletal   Abdominal   Peds  Hematology   Anesthesia Other Findings Past Medical History: No date: Actinic keratosis No date: Anemia No date: Anxiety No date: Arthritis 08/22/2010: Atypical mole     Comment:  Left back  No date: DDD (degenerative disc disease), lumbar No date: Depression No date: Diverticulosis 08/26/2022: Dysplastic nevus     Comment:  R shoulder inf edge of scar, moderate atypia No date: GERD (gastroesophageal reflux disease)     Comment:  OCC-TUMS PRN No date: History of carpal tunnel syndrome No date: Plantar fasciitis 08/09/2019: Squamous cell carcinoma of skin     Comment:  right shoulder  (SCCIS) 08/26/2022: Squamous cell carcinoma of skin     Comment:  SCC IS, R shoulder inf edge of scar, EDC, also txted               with  5FU/Calcipotriene  Reproductive/Obstetrics                             Anesthesia Physical Anesthesia Plan  ASA: 2  Anesthesia Plan: General   Post-op Pain Management: Minimal or no pain anticipated   Induction: Intravenous  PONV Risk Score and Plan: 3 and Propofol infusion, TIVA and Ondansetron  Airway Management Planned: Nasal Cannula  Additional Equipment: None  Intra-op Plan:   Post-operative Plan:   Informed Consent: I have reviewed the patients History and Physical, chart, labs and discussed the procedure including the risks, benefits and alternatives for the proposed anesthesia with the patient or authorized representative who has indicated his/her understanding and acceptance.     Dental advisory given  Plan Discussed with: CRNA and Surgeon  Anesthesia Plan Comments: (NPO appropriate, denies nausea or vomiting. Natural airway appropriate. Discussed risks of anesthesia with patient, including possibility of difficulty with spontaneous ventilation under anesthesia necessitating airway intervention, PONV, and rare risks such as cardiac or respiratory or neurological events, and allergic reactions. Discussed the role of CRNA in patient's perioperative care. Patient understands.)       Anesthesia Quick Evaluation

## 2023-09-02 NOTE — H&P (Signed)
History and Physical    Patient: Bethany Flores NWG:[id number] DOB: 1966/09/07 DOA: 09/01/2023 DOS: the patient was seen and examined on 09/02/2023 PCP: Dorothey Baseman, MD  Patient coming from: Home  Chief Complaint:  Chief Complaint  Patient presents with   Abdominal Pain   Diarrhea    HPI: Bethany Flores is a 57 y.o. female with medical history significant for Depression and anxiety, DDD, who is s/p, bariatric surgery 2015, cholecystectomy 2016 with postcholecystectomy choledocholithiasis a year later in 2017 undergoing ERCP with sphincterotomy and stent placement who presents with 3-day history of upper abdominal pain and diarrhea.  She denies nausea and vomiting.  She went to urgent care where they found her to have elevated LFTs (AST/ALT 78/87) and WBC 11.1 and they sent her to the ED for evaluation.  She denies fever and chills, cough, chest pain or shortness of breath. ED course and data review: Vitals within normal limits Labs significant for normal lipase, AST/ALT 108/100, alk phos 283 and total bilirubin 3.  CMP otherwise unremarkable CBC with leukocytosis of 11,000 otherwise unremarkable Urinalysis unremarkable EKG personally viewed and interpreted showing NSR at 96 with no concerning ST-T wave changes Right upper quadrant ultrasound showed no abnormality MRCP showed the following: IMPRESSION: 1. Surgical absence of the gallbladder. 2. Mild intrahepatic and extrahepatic bile duct dilatation is likely normal for postoperative physiology. 3. Poorly defined filling defects are seen in the cystic duct and common bile duct, most likely artifact but could indicate retained stone or sludge. Consider ERCP for further evaluation if clinically indicated.    The ED provider had spoken earlier to gastroenterologist Dr. Servando Snare who had recommended MRCP. Patient was treated with Percocet and a couple rounds of morphine, Zofran.  She was placed n.p.o based on recommendations of  MRCP.Marland Kitchen Hospitalist consulted for admission.   Review of Systems: As mentioned in the history of present illness. All other systems reviewed and are negative.  Past Medical History:  Diagnosis Date   Actinic keratosis    Anemia    Anxiety    Arthritis    Atypical mole 08/22/2010   Left back    DDD (degenerative disc disease), lumbar    Depression    Diverticulosis    Dysplastic nevus 08/26/2022   R shoulder inf edge of scar, moderate atypia   GERD (gastroesophageal reflux disease)    OCC-TUMS PRN   History of carpal tunnel syndrome    Plantar fasciitis    Squamous cell carcinoma of skin 08/09/2019   right shoulder  (SCCIS)   Squamous cell carcinoma of skin 08/26/2022   SCC IS, R shoulder inf edge of scar, EDC, also txted with 5FU/Calcipotriene   Past Surgical History:  Procedure Laterality Date   ABLATION ON ENDOMETRIOSIS  05/04/2018   Procedure: EXCISION OF ENDOMETRIOSIS;  Surgeon: Christeen Douglas, MD;  Location: [facility] ORS;  Service: Gynecology;;   BACK SURGERY  02/16/2018   LOWER LAMINECTOMY   CHOLECYSTECTOMY N/A 04/26/2015   Procedure: LAPAROSCOPIC CHOLECYSTECTOMY WITH INTRAOPERATIVE CHOLANGIOGRAM;  Surgeon: Duwaine Maxin, MD;  Location: [facility] ORS;  Service: General;  Laterality: N/A;   COLONOSCOPY WITH PROPOFOL N/A 02/26/2016   Procedure: COLONOSCOPY WITH PROPOFOL;  Surgeon: Christena Deem, MD;  Location: [facility] ENDOSCOPY;  Service: Endoscopy;  Laterality: N/A;   CYSTOSCOPY  05/04/2018   Procedure: CYSTOSCOPY;  Surgeon: Christeen Douglas, MD;  Location: [facility] ORS;  Service: Gynecology;;   ERCP N/A 11/15/2016   Procedure: ENDOSCOPIC RETROGRADE CHOLANGIOPANCREATOGRAPHY (ERCP);  Surgeon: Midge Minium, MD;  Location: [facility]  ENDOSCOPY;  Service: Endoscopy;  Laterality: N/A;  Inpatient RM [id number]   ERCP N/A 01/14/2017   Procedure: ENDOSCOPIC RETROGRADE CHOLANGIOPANCREATOGRAPHY (ERCP);  Surgeon: Midge Minium, MD;  Location: [facility] ENDOSCOPY;  Service: Endoscopy;  Laterality: N/A;    ESOPHAGOGASTRODUODENOSCOPY N/A 11/03/2018   Procedure: ESOPHAGOGASTRODUODENOSCOPY (EGD);  Surgeon: Christena Deem, MD;  Location: [facility] ENDOSCOPY;  Service: Endoscopy;  Laterality: N/A;   Laminectomy posterior lumbar      LAPAROSCOPIC BILATERAL SALPINGECTOMY Bilateral 05/04/2018   Procedure: LAPAROSCOPIC BILATERAL SALPINGECTOMY;  Surgeon: Christeen Douglas, MD;  Location: [facility] ORS;  Service: Gynecology;  Laterality: Bilateral;   LAPAROSCOPIC GASTRIC SLEEVE RESECTION     LAPAROSCOPIC HYSTERECTOMY N/A 05/04/2018   Procedure: HYSTERECTOMY TOTAL LAPAROSCOPIC;  Surgeon: Christeen Douglas, MD;  Location: [facility] ORS;  Service: Gynecology;  Laterality: N/A;   MICRODISCECTOMY LUMBAR     Social History:  reports that she has never smoked. She has never used smokeless tobacco. She reports current alcohol use. She reports that she does not use drugs.  No Known Allergies  Family History  Problem Relation Age of Onset   Diabetes Paternal Grandmother    Diabetes Paternal Grandfather    Breast cancer Neg Hx     Prior to Admission medications   Medication Sig Start Date End Date Taking? Authorizing Provider  acetaminophen (TYLENOL) 500 MG tablet Take 1,000 mg by mouth every 6 (six) hours as needed.    [provider]  Calcium Carbonate-Vitamin D 600-200 MG-UNIT TABS Take 1 tablet by mouth daily with lunch.     [provider]  metroNIDAZOLE (METROCREAM) 0.75 % cream Apply to mid face 1-2 times a day for rosacea. 08/20/21   [name], MD  Multiple Vitamins-Minerals (MULTIVITAMIN ADULT) TABS Take 1 tablet by mouth daily.    [provider]  Omega-3 Fatty Acids (FISH OIL) 1000 MG CAPS Take by mouth.    [provider]  sertraline (ZOLOFT) 50 MG tablet Take 50 mg by mouth every morning.     [provider]    Physical Exam: Vitals:   09/01/23 1237 09/01/23 1649 09/02/23 0007 09/02/23 0143  BP:  118/70 97/61 103/64  Pulse:  90 77 80  Resp:  16 15 18   Temp:   98 F (36.7 C) 98.5 F (36.9 C) 98.1 F (36.7 C)  TempSrc:  Oral Oral Oral  SpO2:  100% 96% 96%  Weight: 80.7 kg     Height: 5\' 5"  (1.651 m)      Physical Exam Vitals and nursing note reviewed.  Constitutional:      General: She is not in acute distress. HENT:     Head: Normocephalic and atraumatic.  Cardiovascular:     Rate and Rhythm: Normal rate and regular rhythm.     Heart sounds: Normal heart sounds.  Pulmonary:     Effort: Pulmonary effort is normal.     Breath sounds: Normal breath sounds.  Abdominal:     Palpations: Abdomen is soft.     Tenderness: There is no abdominal tenderness.  Neurological:     Mental Status: Mental status is at baseline.     Labs on Admission: I have personally reviewed following labs and imaging studies  CBC: Recent Labs  Lab 09/01/23 1243  WBC 10.9*  HGB 12.9  HCT 38.3  MCV 87.6  PLT 290   Basic Metabolic Panel: Recent Labs  Lab 09/01/23 1243  NA 135  K 3.4*  CL 100  CO2 24  GLUCOSE 90  BUN 12  CREATININE 0.71  CALCIUM 8.8*   GFR: Estimated Creatinine Clearance: 83.4 mL/min (by C-G formula based on SCr of 0.71 mg/dL). Liver Function Tests: Recent Labs  Lab 09/01/23 1243  AST 108*  ALT 100*  ALKPHOS 283*  BILITOT 3.0*  PROT 8.1  ALBUMIN 4.0   Recent Labs  Lab 09/01/23 1243  LIPASE 30   No results for input(s): "AMMONIA" in the last 168 hours. Coagulation Profile: No results for input(s): "INR", "PROTIME" in the last 168 hours. Cardiac Enzymes: No results for input(s): "CKTOTAL", "CKMB", "CKMBINDEX", "TROPONINI" in the last 168 hours. BNP (last 3 results) No results for input(s): "PROBNP" in the last 8760 hours. HbA1C: No results for input(s): "HGBA1C" in the last 72 hours. CBG: No results for input(s): "GLUCAP" in the last 168 hours. Lipid Profile: No results for input(s): "CHOL", "HDL", "LDLCALC", "TRIG", "CHOLHDL", "LDLDIRECT" in the last 72 hours. Thyroid Function Tests: No results for input(s):  "TSH", "T4TOTAL", "FREET4", "T3FREE", "THYROIDAB" in the last 72 hours. Anemia Panel: No results for input(s): "VITAMINB12", "FOLATE", "FERRITIN", "TIBC", "IRON", "RETICCTPCT" in the last 72 hours. Urine analysis:    Component Value Date/Time   COLORURINE AMBER (A) 09/01/2023 1243   APPEARANCEUR HAZY (A) 09/01/2023 1243   LABSPEC 1.021 09/01/2023 1243   PHURINE 5.0 09/01/2023 1243   GLUCOSEU NEGATIVE 09/01/2023 1243   HGBUR NEGATIVE 09/01/2023 1243   BILIRUBINUR NEGATIVE 09/01/2023 1243   KETONESUR NEGATIVE 09/01/2023 1243   PROTEINUR NEGATIVE 09/01/2023 1243   NITRITE NEGATIVE 09/01/2023 1243   LEUKOCYTESUR TRACE (A) 09/01/2023 1243    Radiological Exams on Admission: MR ABDOMEN MRCP W WO CONTAST  Result Date: 09/01/2023 CLINICAL DATA:  Cholelithiasis. Upper abdominal pain with elevated liver function studies. EXAM: MRI ABDOMEN WITHOUT AND WITH CONTRAST (INCLUDING MRCP) TECHNIQUE: Multiplanar multisequence MR imaging of the abdomen was performed both before and after the administration of intravenous contrast. Heavily T2-weighted images of the biliary and pancreatic ducts were obtained, and three-dimensional MRCP images were rendered by post processing. CONTRAST:  7mL GADAVIST GADOBUTROL 1 MMOL/ML IV SOLN COMPARISON:  Ultrasound abdomen 09/01/2023. CT abdomen and pelvis 11/13/2016 FINDINGS: Lower chest: No acute findings. Hepatobiliary: The gallbladder is surgically absent. There is mild intra and extrahepatic bile duct dilatation, likely normal for postoperative physiology. Poorly defined filling defects demonstrated in the cystic duct and common bile ducts, most likely artifact but retained stone or sludge is possible. Consider ERCP for further evaluation if clinically indicated. No focal liver lesions are identified. Pancreas: No mass, inflammatory changes, or other parenchymal abnormality identified. Spleen:  Within normal limits in size and appearance. Adrenals/Urinary Tract: No masses  identified. No evidence of hydronephrosis. Stomach/Bowel: Visualized portions within the abdomen are unremarkable. Vascular/Lymphatic: No pathologically enlarged lymph nodes identified. No abdominal aortic aneurysm demonstrated. Other:  No upper abdominal ascites or free air is identified. Musculoskeletal: No suspicious bone lesions identified. IMPRESSION: 1. Surgical absence of the gallbladder. 2. Mild intrahepatic and extrahepatic bile duct dilatation is likely normal for postoperative physiology. 3. Poorly defined filling defects are seen in the cystic duct and common bile duct, most likely artifact but could indicate retained stone or sludge. Consider ERCP for further evaluation if clinically indicated. Electronically Signed   By: Burman Nieves M.D.   On: 09/01/2023 23:53   MR 3D Recon At Scanner  Result Date: 09/01/2023 CLINICAL DATA:  Cholelithiasis. Upper abdominal pain with elevated liver function studies. EXAM: MRI ABDOMEN WITHOUT AND WITH CONTRAST (INCLUDING MRCP) TECHNIQUE: Multiplanar multisequence MR imaging of the abdomen was performed  both before and after the administration of intravenous contrast. Heavily T2-weighted images of the biliary and pancreatic ducts were obtained, and three-dimensional MRCP images were rendered by post processing. CONTRAST:  7mL GADAVIST GADOBUTROL 1 MMOL/ML IV SOLN COMPARISON:  Ultrasound abdomen 09/01/2023. CT abdomen and pelvis 11/13/2016 FINDINGS: Lower chest: No acute findings. Hepatobiliary: The gallbladder is surgically absent. There is mild intra and extrahepatic bile duct dilatation, likely normal for postoperative physiology. Poorly defined filling defects demonstrated in the cystic duct and common bile ducts, most likely artifact but retained stone or sludge is possible. Consider ERCP for further evaluation if clinically indicated. No focal liver lesions are identified. Pancreas: No mass, inflammatory changes, or other parenchymal abnormality identified.  Spleen:  Within normal limits in size and appearance. Adrenals/Urinary Tract: No masses identified. No evidence of hydronephrosis. Stomach/Bowel: Visualized portions within the abdomen are unremarkable. Vascular/Lymphatic: No pathologically enlarged lymph nodes identified. No abdominal aortic aneurysm demonstrated. Other:  No upper abdominal ascites or free air is identified. Musculoskeletal: No suspicious bone lesions identified. IMPRESSION: 1. Surgical absence of the gallbladder. 2. Mild intrahepatic and extrahepatic bile duct dilatation is likely normal for postoperative physiology. 3. Poorly defined filling defects are seen in the cystic duct and common bile duct, most likely artifact but could indicate retained stone or sludge. Consider ERCP for further evaluation if clinically indicated. Electronically Signed   By: Burman Nieves M.D.   On: 09/01/2023 23:53   US ABDOMEN LIMITED RUQ (LIVER/GB)  Result Date: 09/01/2023 CLINICAL DATA:  Upper abdominal pain Elevated liver function tests EXAM: ULTRASOUND ABDOMEN LIMITED RIGHT UPPER QUADRANT COMPARISON:  CT abdomen pelvis 11/13/2016 FINDINGS: Gallbladder: Surgically absent Common bile duct: Diameter: 9 mm Liver: Parenchymal echogenicity: Within normal limits Contours: Normal Lesions: None Portal vein: Patent.  Hepatopetal flow Other: None. IMPRESSION: No significant sonographic abnormality of the liver or bile ducts. Electronically Signed   By: Acquanetta Belling M.D.   On: 09/01/2023 16:05     Data Reviewed: Relevant notes from primary care and specialist visits, past discharge summaries as available in EHR, including Care Everywhere. Prior diagnostic testing as pertinent to current admission diagnoses Updated medications and problem lists for reconciliation ED course, including vitals, labs, imaging, treatment and response to treatment Triage notes, nursing and pharmacy notes and ED provider's notes Notable results as noted in HPI   Assessment and  Plan: * Cholestasis, possible choledocholithiasis History of cholecystectomy 2016 with postcholecystectomy choledocholithiasis in 2017 Abdominal pain and diarrhea AST/ALT 108/100, alk phos 283 and total bilirubin 3 MRI with concerning findings similar to postcholecystectomy choledocholithiasis-mild intrahepatic and extrahepatic biliary ductal dilatation but due to filling defects and cystic duct recommending consideration of ERCP N.p.o. Pain control, antiemetics GI consult to evaluate for need for ERCP   GERD (gastroesophageal reflux disease) As needed PPI  Depression Continue sertraline following GI evaluation        DVT prophylaxis: SCD  Consults: GI, Dr. Allegra Lai  Advance Care Planning:   Code Status: Prior   Family Communication: none  Disposition Plan: Back to previous home environment  Severity of Illness: The appropriate patient status for this patient is INPATIENT. Inpatient status is judged to be reasonable and necessary in order to provide the required intensity of service to ensure the patient's safety. The patient's presenting symptoms, physical exam findings, and initial radiographic and laboratory data in the context of their chronic comorbidities is felt to place them at high risk for further clinical deterioration. Furthermore, it is not anticipated that the patient will be  medically stable for discharge from the hospital within 2 midnights of admission.   * I certify that at the point of admission it is my clinical judgment that the patient will require inpatient hospital care spanning beyond 2 midnights from the point of admission due to high intensity of service, high risk for further deterioration and high frequency of surveillance required.*  Author: Andris Baumann, MD 09/02/2023 1:44 AM  For on call review [email or web address].

## 2023-09-02 NOTE — Progress Notes (Addendum)
[facility] Progress Note  Patient: Bethany Flores    BMW:[id number]  DOA: 09/01/2023     Date of Service: the patient was seen and examined on 09/02/2023  Chief Complaint  Patient presents with   Abdominal Pain   Diarrhea   Brief hospital course: Bethany Flores is a 57 y.o. female with medical history significant for Depression and anxiety, DDD, who is s/p, bariatric surgery 2015, cholecystectomy 2016 with postcholecystectomy choledocholithiasis a year later in 2017 undergoing ERCP with sphincterotomy and stent placement who presents with 3-day history of upper abdominal pain and diarrhea.  She denies nausea and vomiting.  She went to urgent care where they found her to have elevated LFTs (AST/ALT 78/87) and WBC 11.1 and they sent her to the ED for evaluation.  She denies fever and chills, cough, chest pain or shortness of breath. ED course and data review: Vitals within normal limits Labs significant for normal lipase, AST/ALT 108/100, alk phos 283 and total bilirubin 3.  CMP otherwise unremarkable CBC with leukocytosis of 11,000 otherwise unremarkable Urinalysis unremarkable EKG personally viewed and interpreted showing NSR at 96 with no concerning ST-T wave changes Right upper quadrant ultrasound showed no abnormality MRCP: IMPRESSION: 1. Surgical absence of the gallbladder. 2. Mild intrahepatic and extrahepatic bile duct dilatation is likely normal for postoperative physiology. 3. Poorly defined filling defects are seen in the cystic duct and common bile duct, most likely artifact but could indicate retained stone or sludge. Consider ERCP for further evaluation if clinically indicated.   The ED provider had spoken earlier to gastroenterologist Dr. Servando Snare who had recommended MRCP. Patient was treated with Percocet and a couple rounds of morphine, Zofran.  She was placed n.p.o based on recommendations of MRCP.Marland Kitchen Hospitalist consulted for admission.    Assessment and Plan:  # CBD  obstruction due to sludge Presented with intractable abdominal pain MRCP reviewed as above GI consulted, s/p ERCP, sludge removed.  Complaining of severe abdominal pain postprocedure possible due to pancreatitis Follow lipase and LFTs May need repeat CT scan Continue as needed medication for pain control continue IV fluids, Follow GI for further recommendation Started clear liquid diet after procedure, advance as per tolerance Started PPI for GI prophylaxis  # Hypoglycemia due to n.p.o. status S/p D50 1 amp given, started D5 NS IV fluid Monitor CBG  # Depression, resumed Zoloft.  Body mass index is 29.62 kg/m.  Interventions:  Diet: Clear liquid diet, gradually advance as per tolerance DVT Prophylaxis: Subcutaneous Lovenox scd tonight post procedure  Advance goals of care discussion: Full code  Family Communication: family was present at bedside, at the time of interview.  The pt provided permission to discuss medical plan with the family. Opportunity was given to ask question and all questions were answered satisfactorily.   Disposition:  Pt is from home, admitted with CBD dilatation s/p ERCP, still has Pain, which precludes a safe discharge. Discharge to Home, when stable.  Subjective: No significant events overnight, patient was complaining of epigastric and right upper quadrant pain.  Pain got worse after ERCP, we will continue to monitor overnight and plan for disposition tomorrow a.m. if remains stable.  Physical Exam: General: NAD, lying comfortably Appear in no distress, affect appropriate Eyes: PERRLA ENT: Oral Mucosa Clear, moist  Neck: no JVD,  Cardiovascular: S1 and S2 Present, no Murmur,  Respiratory: good respiratory effort, Bilateral Air entry equal and Decreased, no Crackles, no wheezes Abdomen: Bowel Sound present, Soft and RUQ and epigastric tenderness,  Skin: no rashes Extremities: no Pedal edema, no calf tenderness Neurologic: without any new focal  findings Gait not checked due to patient safety concerns  Vitals:   09/02/23 1121 09/02/23 1204 09/02/23 1235 09/02/23 1256  BP: 102/60 93/65 98/64  (!) 111/56  Pulse: 86 83 76 84  Resp: 16  15 16   Temp: 97.8 F (36.6 C)  98.3 F (36.8 C) 98.3 F (36.8 C)  TempSrc: Temporal  Oral Oral  SpO2:  100% 100% 99%  Weight:      Height:        Intake/Output Summary (Last 24 hours) at 09/02/2023 1456 Last data filed at 09/02/2023 1120 Gross per 24 hour  Intake 100 ml  Output 0 ml  Net 100 ml   Filed Weights   09/01/23 1237  Weight: 80.7 kg    Data Reviewed: I have personally reviewed and interpreted daily labs, tele strips, imagings as discussed above. I reviewed all nursing notes, pharmacy notes, vitals, pertinent old records I have discussed plan of care as described above with RN and patient/family.  CBC: Recent Labs  Lab 09/01/23 1243 09/02/23 0345 09/02/23 1358  WBC 10.9* 8.7 8.0  HGB 12.9 11.3* 12.0  HCT 38.3 32.6* 34.7*  MCV 87.6 85.3 85.7  PLT 290 254 299   Basic Metabolic Panel: Recent Labs  Lab 09/01/23 1243 09/02/23 0345 09/02/23 1358  NA 135 136 135  K 3.4* 3.6 3.3*  CL 100 102 101  CO2 24 22 21*  GLUCOSE 90 66* 108*  BUN 12 16 13   CREATININE 0.71 0.77 0.63  CALCIUM 8.8* 8.4* 8.4*  MG  --  1.8 1.7  PHOS  --  3.0 3.0    Studies: DG C-Arm 1-60 Min-No Report  Result Date: 09/02/2023 Fluoroscopy was utilized by the requesting physician.  No radiographic interpretation.   MR ABDOMEN MRCP W WO CONTAST  Result Date: 09/01/2023 CLINICAL DATA:  Cholelithiasis. Upper abdominal pain with elevated liver function studies. EXAM: MRI ABDOMEN WITHOUT AND WITH CONTRAST (INCLUDING MRCP) TECHNIQUE: Multiplanar multisequence MR imaging of the abdomen was performed both before and after the administration of intravenous contrast. Heavily T2-weighted images of the biliary and pancreatic ducts were obtained, and three-dimensional MRCP images were rendered by post  processing. CONTRAST:  7mL GADAVIST GADOBUTROL 1 MMOL/ML IV SOLN COMPARISON:  Ultrasound abdomen 09/01/2023. CT abdomen and pelvis 11/13/2016 FINDINGS: Lower chest: No acute findings. Hepatobiliary: The gallbladder is surgically absent. There is mild intra and extrahepatic bile duct dilatation, likely normal for postoperative physiology. Poorly defined filling defects demonstrated in the cystic duct and common bile ducts, most likely artifact but retained stone or sludge is possible. Consider ERCP for further evaluation if clinically indicated. No focal liver lesions are identified. Pancreas: No mass, inflammatory changes, or other parenchymal abnormality identified. Spleen:  Within normal limits in size and appearance. Adrenals/Urinary Tract: No masses identified. No evidence of hydronephrosis. Stomach/Bowel: Visualized portions within the abdomen are unremarkable. Vascular/Lymphatic: No pathologically enlarged lymph nodes identified. No abdominal aortic aneurysm demonstrated. Other:  No upper abdominal ascites or free air is identified. Musculoskeletal: No suspicious bone lesions identified. IMPRESSION: 1. Surgical absence of the gallbladder. 2. Mild intrahepatic and extrahepatic bile duct dilatation is likely normal for postoperative physiology. 3. Poorly defined filling defects are seen in the cystic duct and common bile duct, most likely artifact but could indicate retained stone or sludge. Consider ERCP for further evaluation if clinically indicated. Electronically Signed   By: Burman Nieves M.D.   On: 09/01/2023  23:53   MR 3D Recon At Scanner  Result Date: 09/01/2023 CLINICAL DATA:  Cholelithiasis. Upper abdominal pain with elevated liver function studies. EXAM: MRI ABDOMEN WITHOUT AND WITH CONTRAST (INCLUDING MRCP) TECHNIQUE: Multiplanar multisequence MR imaging of the abdomen was performed both before and after the administration of intravenous contrast. Heavily T2-weighted images of the biliary and  pancreatic ducts were obtained, and three-dimensional MRCP images were rendered by post processing. CONTRAST:  7mL GADAVIST GADOBUTROL 1 MMOL/ML IV SOLN COMPARISON:  Ultrasound abdomen 09/01/2023. CT abdomen and pelvis 11/13/2016 FINDINGS: Lower chest: No acute findings. Hepatobiliary: The gallbladder is surgically absent. There is mild intra and extrahepatic bile duct dilatation, likely normal for postoperative physiology. Poorly defined filling defects demonstrated in the cystic duct and common bile ducts, most likely artifact but retained stone or sludge is possible. Consider ERCP for further evaluation if clinically indicated. No focal liver lesions are identified. Pancreas: No mass, inflammatory changes, or other parenchymal abnormality identified. Spleen:  Within normal limits in size and appearance. Adrenals/Urinary Tract: No masses identified. No evidence of hydronephrosis. Stomach/Bowel: Visualized portions within the abdomen are unremarkable. Vascular/Lymphatic: No pathologically enlarged lymph nodes identified. No abdominal aortic aneurysm demonstrated. Other:  No upper abdominal ascites or free air is identified. Musculoskeletal: No suspicious bone lesions identified. IMPRESSION: 1. Surgical absence of the gallbladder. 2. Mild intrahepatic and extrahepatic bile duct dilatation is likely normal for postoperative physiology. 3. Poorly defined filling defects are seen in the cystic duct and common bile duct, most likely artifact but could indicate retained stone or sludge. Consider ERCP for further evaluation if clinically indicated. Electronically Signed   By: Burman Nieves M.D.   On: 09/01/2023 23:53    Scheduled Meds: Continuous Infusions:  dextrose 5 % and 0.9 % NaCl 150 mL/hr at 09/02/23 1445   PRN Meds: acetaminophen **OR** acetaminophen, HYDROcodone-acetaminophen, HYDROmorphone (DILAUDID) injection, morphine injection, ondansetron **OR** ondansetron (ZOFRAN) IV  Time spent: 55  minutes  Author: Gillis Santa. MD [facility] 09/02/2023 2:56 PM  To reach On-call, see care teams to locate the attending and reach out to them via [email or web address]. If 7PM-7AM, please contact night-coverage If you still have difficulty reaching the attending provider, please page the [facility] (Director on Call) for [facility] on amion for assistance.

## 2023-09-02 NOTE — Assessment & Plan Note (Addendum)
Continue sertraline following GI evaluation

## 2023-09-02 NOTE — Progress Notes (Signed)
Pharmacy Antibiotic Note  Bethany Flores is a 57 y.o. female admitted on 09/01/2023 with intra-abdominal infection.  Pharmacy has been consulted for Cefepime dosing for 7 days.  Plan: Cefepime 2 gm q8hr per indication & renal fxn.  Pharmacy will continue to follow and will adjust abx dosing whenever warranted.  Temp (24hrs), Avg:99 F (37.2 C), Min:97.8 F (36.6 C), Max:102.1 F (38.9 C)   Recent Labs  Lab 09/01/23 1243 09/02/23 0345 09/02/23 1358  WBC 10.9* 8.7 8.0  CREATININE 0.71 0.77 0.63    Estimated Creatinine Clearance: 83.4 mL/min (by C-G formula based on SCr of 0.63 mg/dL).    No Known Allergies  Antimicrobials this admission: 10/15 Cefepime >> x 7 days 10/15 Flagyl >> x  7 days  Microbiology results: 10/15 BCx: Pending  Thank you for allowing pharmacy to be a part of this patient's care.  Otelia Sergeant, PharmD, [facility] 09/02/2023 11:36 PM

## 2023-09-02 NOTE — Assessment & Plan Note (Signed)
As needed PPI

## 2023-09-02 NOTE — ED Provider Notes (Signed)
-----------------------------------------   12:18 AM on 09/02/2023 -----------------------------------------   MRCP interpreted per Dr. Andria Meuse:  1. Surgical absence of the gallbladder.  2. Mild intrahepatic and extrahepatic bile duct dilatation is likely  normal for postoperative physiology.  3. Poorly defined filling defects are seen in the cystic duct and  common bile duct, most likely artifact but could indicate retained  stone or sludge. Consider ERCP for further evaluation if clinically  indicated.   Morphine reordered for returning pain.  Given patient's continued pain, coupled with elevated LFTs, will consult hospitalist services for evaluation and admission.   Irean Hong, MD 09/02/23 0200

## 2023-09-02 NOTE — Assessment & Plan Note (Addendum)
History of cholecystectomy 2016 with postcholecystectomy choledocholithiasis in 2017 Abdominal pain and diarrhea AST/ALT 108/100, alk phos 283 and total bilirubin 3 MRI with concerning findings similar to postcholecystectomy choledocholithiasis-mild intrahepatic and extrahepatic biliary ductal dilatation but due to filling defects and cystic duct recommending consideration of ERCP N.p.o. Pain control, antiemetics GI consult to evaluate for need for ERCP

## 2023-09-03 ENCOUNTER — Encounter: Payer: Self-pay | Admitting: Gastroenterology

## 2023-09-03 ENCOUNTER — Inpatient Hospital Stay: Payer: BC Managed Care – PPO

## 2023-09-03 DIAGNOSIS — K831 Obstruction of bile duct: Secondary | ICD-10-CM | POA: Diagnosis not present

## 2023-09-03 LAB — BASIC METABOLIC PANEL
Anion gap: 7 (ref 5–15)
BUN: 7 mg/dL (ref 6–20)
CO2: 21 mmol/L — ABNORMAL LOW (ref 22–32)
Calcium: 7.6 mg/dL — ABNORMAL LOW (ref 8.9–10.3)
Chloride: 107 mmol/L (ref 98–111)
Creatinine, Ser: 0.73 mg/dL (ref 0.44–1.00)
GFR, Estimated: >60 mL/min (ref >60)
Glucose, Bld: 124 mg/dL — ABNORMAL HIGH (ref 70–99)
Potassium: 3.2 mmol/L — ABNORMAL LOW (ref 3.5–5.1)
Sodium: 135 mmol/L (ref 135–145)

## 2023-09-03 LAB — LACTIC ACID, PLASMA
Lactic Acid, Venous: 1.1 mmol/L (ref 0.5–1.9)
Lactic Acid, Venous: 1.2 mmol/L (ref 0.5–1.9)

## 2023-09-03 LAB — GASTROINTESTINAL PANEL BY PCR, STOOL (REPLACES STOOL CULTURE)

## 2023-09-03 LAB — COMPREHENSIVE METABOLIC PANEL
ALT: 116 U/L — ABNORMAL HIGH (ref 0–44)
AST: 97 U/L — ABNORMAL HIGH (ref 15–41)
Albumin: 3.5 g/dL (ref 3.5–5.0)
Alkaline Phosphatase: 607 U/L — ABNORMAL HIGH (ref 38–126)
Anion gap: 10 (ref 5–15)
BUN: 9 mg/dL (ref 6–20)
CO2: 20 mmol/L — ABNORMAL LOW (ref 22–32)
Calcium: 8.1 mg/dL — ABNORMAL LOW (ref 8.9–10.3)
Chloride: 103 mmol/L (ref 98–111)
Creatinine, Ser: 0.64 mg/dL (ref 0.44–1.00)
GFR, Estimated: >60 mL/min (ref >60)
Glucose, Bld: 147 mg/dL — ABNORMAL HIGH (ref 70–99)
Potassium: 3 mmol/L — ABNORMAL LOW (ref 3.5–5.1)
Sodium: 133 mmol/L — ABNORMAL LOW (ref 135–145)
Total Bilirubin: 5.2 mg/dL — ABNORMAL HIGH (ref 0.3–1.2)
Total Protein: 7.4 g/dL (ref 6.5–8.1)

## 2023-09-03 LAB — CBC
HCT: 31.1 % — ABNORMAL LOW (ref 36.0–46.0)
Hemoglobin: 10.9 g/dL — ABNORMAL LOW (ref 12.0–15.0)
MCH: 29.8 pg (ref 26.0–34.0)
MCHC: 35 g/dL (ref 30.0–36.0)
MCV: 85 fL (ref 80.0–100.0)
Platelets: 248 10*3/uL (ref 150–400)
RBC: 3.66 MIL/uL — ABNORMAL LOW (ref 3.87–5.11)
RDW: 11.9 % (ref 11.5–15.5)
WBC: 10.8 10*3/uL — ABNORMAL HIGH (ref 4.0–10.5)
nRBC: 0 % (ref 0.0–0.2)

## 2023-09-03 LAB — HEPATIC FUNCTION PANEL
ALT: 98 U/L — ABNORMAL HIGH (ref 0–44)
AST: 75 U/L — ABNORMAL HIGH (ref 15–41)
Albumin: 2.9 g/dL — ABNORMAL LOW (ref 3.5–5.0)
Alkaline Phosphatase: 498 U/L — ABNORMAL HIGH (ref 38–126)
Bilirubin, Direct: 2.3 mg/dL — ABNORMAL HIGH (ref 0.0–0.2)
Indirect Bilirubin: 2.2 mg/dL — ABNORMAL HIGH (ref 0.3–0.9)
Total Bilirubin: 4.5 mg/dL — ABNORMAL HIGH (ref 0.3–1.2)
Total Protein: 6.5 g/dL (ref 6.5–8.1)

## 2023-09-03 LAB — HEPATITIS PANEL, ACUTE
HCV Ab: NONREACTIVE
Hep A IgM: NONREACTIVE
Hep B C IgM: NONREACTIVE
Hepatitis B Surface Ag: NONREACTIVE

## 2023-09-03 LAB — IRON AND TIBC
Iron: 18 ug/dL — ABNORMAL LOW (ref 28–170)
Saturation Ratios: 10 % — ABNORMAL LOW (ref 10.4–31.8)
TIBC: 178 ug/dL — ABNORMAL LOW (ref 250–450)
UIBC: 160 ug/dL

## 2023-09-03 LAB — PROCALCITONIN: Procalcitonin: 3.96 ng/mL

## 2023-09-03 LAB — C DIFFICILE QUICK SCREEN W PCR REFLEX
C Diff antigen: NEGATIVE
C Diff interpretation: NOT DETECTED
C Diff toxin: NEGATIVE

## 2023-09-03 LAB — PROTIME-INR
INR: 1.4 — ABNORMAL HIGH (ref 0.8–1.2)
Prothrombin Time: 17.5 s — ABNORMAL HIGH (ref 11.4–15.2)

## 2023-09-03 LAB — MAGNESIUM: Magnesium: 1.4 mg/dL — ABNORMAL LOW (ref 1.7–2.4)

## 2023-09-03 LAB — VITAMIN D 25 HYDROXY (VIT D DEFICIENCY, FRACTURES): Vit D, 25-Hydroxy: 57.74 ng/mL (ref 30–100)

## 2023-09-03 LAB — APTT: aPTT: 36 s (ref 24–36)

## 2023-09-03 LAB — PHOSPHORUS: Phosphorus: 2.2 mg/dL — ABNORMAL LOW (ref 2.5–4.6)

## 2023-09-03 LAB — FOLATE: Folate: 15.7 ng/mL (ref >5.9)

## 2023-09-03 LAB — LIPASE, BLOOD: Lipase: 28 U/L (ref 11–51)

## 2023-09-03 LAB — VITAMIN B12: Vitamin B-12: 1520 pg/mL — ABNORMAL HIGH (ref 180–914)

## 2023-09-03 MED ORDER — POLYSACCHARIDE IRON COMPLEX 150 MG PO CAPS
150.0000 mg | ORAL_CAPSULE | Freq: Every day | ORAL | Status: DC
  Administered 2023-09-04 – 2023-09-05 (×2): 150 mg via ORAL
  Filled 2023-09-03 (×3): qty 1

## 2023-09-03 MED ORDER — SACCHAROMYCES BOULARDII 250 MG PO CAPS
250.0000 mg | ORAL_CAPSULE | Freq: Two times a day (BID) | ORAL | Status: DC
  Administered 2023-09-03 – 2023-09-05 (×5): 250 mg via ORAL
  Filled 2023-09-03 (×5): qty 1

## 2023-09-03 MED ORDER — LOPERAMIDE HCL 2 MG PO CAPS: 4.0000 mg | ORAL_CAPSULE | Freq: Three times a day (TID) | ORAL | Status: DC | PRN

## 2023-09-03 MED ORDER — ASCORBIC ACID 500 MG/5ML PO LIQD
500.0000 mg | Freq: Every day | ORAL | Status: DC
  Administered 2023-09-04 – 2023-09-05 (×2): 500 mg via ORAL
  Filled 2023-09-03 (×2): qty 5

## 2023-09-03 MED ORDER — POTASSIUM CHLORIDE CRYS ER 20 MEQ PO TBCR
40.0000 meq | EXTENDED_RELEASE_TABLET | Freq: Once | ORAL | Status: AC
  Administered 2023-09-03: 40 meq via ORAL
  Filled 2023-09-03: qty 2

## 2023-09-03 MED ORDER — SODIUM CHLORIDE 0.9 % IV BOLUS
1000.0000 mL | Freq: Once | INTRAVENOUS | Status: AC
  Administered 2023-09-03: 1000 mL via INTRAVENOUS

## 2023-09-03 MED ORDER — MAGNESIUM SULFATE 4 GM/100ML IV SOLN
4.0000 g | Freq: Once | INTRAVENOUS | Status: AC
  Administered 2023-09-03: 4 g via INTRAVENOUS
  Filled 2023-09-03: qty 100

## 2023-09-03 MED ORDER — POTASSIUM PHOSPHATES 15 MMOLE/5ML IV SOLN
30.0000 mmol | Freq: Once | INTRAVENOUS | Status: AC
  Administered 2023-09-03: 30 mmol via INTRAVENOUS
  Filled 2023-09-03: qty 10

## 2023-09-03 MED ORDER — IOHEXOL 300 MG/ML  SOLN
100.0000 mL | Freq: Once | INTRAMUSCULAR | Status: AC | PRN
  Administered 2023-09-03: 100 mL via INTRAVENOUS

## 2023-09-03 NOTE — Progress Notes (Signed)
Transition of Care [facility] - Inpatient Brief Assessment   Patient Details  Name: Bethany Flores MRN: [id number] Date of Birth: 01/02/1966  Transition of Care [facility]) CM/SW Contact:    Truddie Hidden, RN Phone Number: 09/03/2023, 12:15 PM   Clinical Narrative:  TOC continuing to follow patient's progress throughout discharge planning.   Transition of Care Asessment: Insurance and Status: Insurance coverage has been reviewed Patient has primary care physician: Yes Home environment has been reviewed: home Prior level of function:: independent Prior/Current Home Services: No current home services Social Determinants of Health Reivew: SDOH reviewed no interventions necessary Readmission risk has been reviewed: Yes Transition of care needs: no transition of care needs at this time

## 2023-09-03 NOTE — Progress Notes (Signed)
GI Inpatient Follow-up Note  Subjective:  Patient seen and doing well. Apparently had fever and diarrhea last night for which she got a CT which was unremarkable and stool studies which was unremarkable.  Scheduled Inpatient Medications:   [START ON 09/04/2023] ascorbic acid  500 mg Oral Daily   enoxaparin (LOVENOX) injection  40 mg Subcutaneous QPM   [START ON 09/04/2023] iron polysaccharides  150 mg Oral Daily   pantoprazole (PROTONIX) IV  40 mg Intravenous BID   saccharomyces boulardii  250 mg Oral BID   sertraline  50 mg Oral BH-q7a    Continuous Inpatient Infusions:    ceFEPime (MAXIPIME) IV 2 g (09/03/23 1019)   lactated ringers 150 mL/hr (09/03/23 1127)   metronidazole 500 mg (09/03/23 1109)   potassium PHOSPHATE IVPB (in mmol) 30 mmol (09/03/23 1103)    PRN Inpatient Medications:  acetaminophen **OR** acetaminophen, HYDROcodone-acetaminophen, HYDROmorphone (DILAUDID) injection, loperamide, morphine injection, ondansetron **OR** ondansetron (ZOFRAN) IV  Review of Systems:  Review of Systems  Constitutional:  Positive for fever. Negative for malaise/fatigue.  Respiratory:  Negative for shortness of breath.   Cardiovascular:  Negative for chest pain.  Gastrointestinal:  Positive for diarrhea. Negative for abdominal pain, blood in stool and melena.  Musculoskeletal:  Negative for joint pain.  Skin:  Negative for rash.  Neurological:  Negative for focal weakness.  Psychiatric/Behavioral:  Negative for substance abuse.   All other systems reviewed and are negative.     Physical Examination: BP 104/63 (BP Location: Right Arm)   Pulse 97   Temp 99.5 F (37.5 C) (Oral)   Resp 17   Ht 5\' 5"  (1.651 m)   Wt 80.7 kg   LMP 04/24/2018   SpO2 100%   BMI 29.62 kg/m  Gen: NAD, alert and oriented x 4 HEENT: PEERLA, EOMI, Neck: supple, no JVD or thyromegaly Chest: No respiratory distress Abd: soft, non-tender, non-distended Ext: no edema, well perfused with 2+  pulses, Skin: no rash or lesions noted Lymph: no LAD  Data: Lab Results  Component Value Date   WBC 10.8 (H) 09/03/2023   HGB 10.9 (L) 09/03/2023   HCT 31.1 (L) 09/03/2023   MCV 85.0 09/03/2023   PLT 248 09/03/2023   Recent Labs  Lab 09/02/23 1358 09/02/23 2335 09/03/23 0231  HGB 12.0 11.8* 10.9*   Lab Results  Component Value Date   NA 135 09/03/2023   K 3.2 (L) 09/03/2023   CL 107 09/03/2023   CO2 21 (L) 09/03/2023   BUN 7 09/03/2023   CREATININE 0.73 09/03/2023   Lab Results  Component Value Date   ALT 98 (H) 09/03/2023   AST 75 (H) 09/03/2023   ALKPHOS 498 (H) 09/03/2023   BILITOT 4.5 (H) 09/03/2023   Recent Labs  Lab 09/02/23 2335  APTT 36  INR 1.4*   Assessment/Plan: Bethany Flores is a 57 y.o. lady with abnormal LFT's, concerning for choledocholithiasis who is s/p ERCP with post procedural pain likely secondary to gas. Liver enzymes have been variable, unclear if they had already been on the way up at time of procedure  Recommendations:  - check daily CMP, will see if numbers start to trend down - will check viral hepatitis - will check autoimmune markers - will advance diet  Will continue to follow, please call with any questions or concerns.  Merlyn Lot MD, MPH [facility] GI

## 2023-09-03 NOTE — Plan of Care (Signed)
Problem: Clinical Measurements: Goal: Ability to maintain clinical measurements within normal limits will improve Outcome: Progressing Goal: Will remain free from infection Outcome: Progressing Goal: Diagnostic test results will improve Outcome: Progressing Goal: Respiratory complications will improve Outcome: Progressing Goal: Cardiovascular complication will be avoided Outcome: Progressing   Problem: Nutrition: Goal: Adequate nutrition will be maintained Outcome: Progressing   Problem: Elimination: Goal: Will not experience complications related to bowel motility Outcome: Progressing Goal: Will not experience complications related to urinary retention Outcome: Progressing   Problem: Safety: Goal: Ability to remain free from injury will improve Outcome: Progressing

## 2023-09-03 NOTE — Progress Notes (Signed)
[facility] Progress Note  Patient: Bethany Flores    VHQ:[id number]  DOA: 09/01/2023     Date of Service: the patient was seen and examined on 09/03/2023  Chief Complaint  Patient presents with   Abdominal Pain   Diarrhea   Brief hospital course: Bethany Flores is a 57 y.o. female with medical history significant for Depression and anxiety, DDD, who is s/p, bariatric surgery 2015, cholecystectomy 2016 with postcholecystectomy choledocholithiasis a year later in 2017 undergoing ERCP with sphincterotomy and stent placement who presents with 3-day history of upper abdominal pain and diarrhea.  She denies nausea and vomiting.  She went to urgent care where they found her to have elevated LFTs (AST/ALT 78/87) and WBC 11.1 and they sent her to the ED for evaluation.  She denies fever and chills, cough, chest pain or shortness of breath. ED course and data review: Vitals within normal limits Labs significant for normal lipase, AST/ALT 108/100, alk phos 283 and total bilirubin 3.  CMP otherwise unremarkable CBC with leukocytosis of 11,000 otherwise unremarkable Urinalysis unremarkable EKG personally viewed and interpreted showing NSR at 96 with no concerning ST-T wave changes Right upper quadrant ultrasound showed no abnormality MRCP: IMPRESSION: 1. Surgical absence of the gallbladder. 2. Mild intrahepatic and extrahepatic bile duct dilatation is likely normal for postoperative physiology. 3. Poorly defined filling defects are seen in the cystic duct and common bile duct, most likely artifact but could indicate retained stone or sludge. Consider ERCP for further evaluation if clinically indicated.   The ED provider had spoken earlier to gastroenterologist Dr. Servando Snare who had recommended MRCP. Patient was treated with Percocet and a couple rounds of morphine, Zofran.  She was placed n.p.o based on recommendations of MRCP.Marland Kitchen Hospitalist consulted for admission.    Assessment and Plan:  # CBD  obstruction due to sludge Presented with intractable abdominal pain MRCP reviewed as above GI consulted, s/p ERCP, sludge removed.  Complaining of severe abdominal pain postprocedure possible due to pancreatitis C. difficile and GI pathogen negative.  Lipase within normal range Elevated LFTs Continue as needed medication for pain control continue IV fluids, Follow GI for further recommendation Started clear liquid diet after procedure, advance as per tolerance Started PPI for GI prophylaxis Trend LFTs  # Sepsis, developed s/p ERCP 10/16 patient spiked a fever last night, was started on IV antibiotics  CT A/P repeated, no postprocedure complications Started cefepime 2 g IV every 8 hourly and Flagyl 500 mg IV twice daily Follow blood cultures IV fluid was given as per sepsis protocol Continue IV fluid and monitor vital signs  # Diarrhea most likely secondary to sepsis Started Imodium as needed Started probiotics Monitor electrolytes  Electrolyte imbalance secondary to diarrhea #Hypokalemia, potassium repleted # Hypophosphatemia, Phos repleted. # Hypomagnesemia, mag repleted. Monitor electrolytes  # Hypoglycemia due to n.p.o. status S/p D50 1 amp given, started D5 NS IV fluid Monitor CBG  # Depression, resumed Zoloft.  #Iron deficiency, transferrin saturation 10% Folic acid level within normal range Avoided IV iron secondary to infection Start oral iron supplement with vitamin C on discharge   Body mass index is 29.62 kg/m.  Interventions:  Diet: Regular diet DVT Prophylaxis: Subcutaneous Lovenox   Advance goals of care discussion: Full code  Family Communication: family was present at bedside, at the time of interview.  The pt provided permission to discuss medical plan with the family. Opportunity was given to ask question and all questions were answered satisfactorily.   Disposition:  Pt is from home, admitted with CBD dilatation s/p ERCP, still has  electrolyte imbalance, which precludes a safe discharge. Discharge to Home, when stable.  Most likely in 1 to 2 days  Subjective: No significant events overnight, pain is better now, patient started having diarrhea, 3 episodes since morning and 4-5 episodes yesterday.  Denies any other complaints.   Physical Exam: General: NAD, lying comfortably Appear in no distress, affect appropriate Eyes: PERRLA ENT: Oral Mucosa Clear, moist  Neck: no JVD,  Cardiovascular: S1 and S2 Present, no Murmur,  Respiratory: good respiratory effort, Bilateral Air entry equal and Decreased, no Crackles, no wheezes Abdomen: Bowel Sound present, Soft and Mild upper abd tenderness,  Skin: no rashes Extremities: no Pedal edema, no calf tenderness Neurologic: without any new focal findings Gait not checked due to patient safety concerns  Vitals:   09/03/23 0307 09/03/23 0655 09/03/23 1117 09/03/23 1200  BP: 102/62 94/62 104/63   Pulse: (!) 111 94 97   Resp: 20 20 17    Temp: (!) 103.1 F (39.5 C) 99.9 F (37.7 C) (!) 102.1 F (38.9 C) 99.5 F (37.5 C)  TempSrc: Oral Oral Oral Oral  SpO2: 99% 100% 100%   Weight:      Height:        Intake/Output Summary (Last 24 hours) at 09/03/2023 1420 Last data filed at 09/03/2023 1127 Gross per 24 hour  Intake 1414.07 ml  Output --  Net 1414.07 ml   Filed Weights   09/01/23 1237  Weight: 80.7 kg    Data Reviewed: I have personally reviewed and interpreted daily labs, tele strips, imagings as discussed above. I reviewed all nursing notes, pharmacy notes, vitals, pertinent old records I have discussed plan of care as described above with RN and patient/family.  CBC: Recent Labs  Lab 09/01/23 1243 09/02/23 0345 09/02/23 1358 09/02/23 2335 09/03/23 0231  WBC 10.9* 8.7 8.0 11.7* 10.8*  NEUTROABS  --   --   --  10.8*  --   HGB 12.9 11.3* 12.0 11.8* 10.9*  HCT 38.3 32.6* 34.7* 34.1* 31.1*  MCV 87.6 85.3 85.7 85.5 85.0  PLT 290 254 299 266 248    Basic Metabolic Panel: Recent Labs  Lab 09/01/23 1243 09/02/23 0345 09/02/23 1358 09/02/23 2335 09/03/23 0231  NA 135 136 135 133* 135  K 3.4* 3.6 3.3* 3.0* 3.2*  CL 100 102 101 103 107  CO2 24 22 21* 20* 21*  GLUCOSE 90 66* 108* 147* 124*  BUN 12 16 13 9 7   CREATININE 0.71 0.77 0.63 0.64 0.73  CALCIUM 8.8* 8.4* 8.4* 8.1* 7.6*  MG  --  1.8 1.7  --  1.4*  PHOS  --  3.0 3.0  --  2.2*    Studies: CT ABDOMEN PELVIS W CONTRAST  Result Date: 09/03/2023 CLINICAL DATA:  Postoperative abdominal pain status post ERCP. EXAM: CT ABDOMEN AND PELVIS WITH CONTRAST TECHNIQUE: Multidetector CT imaging of the abdomen and pelvis was performed using the standard protocol following bolus administration of intravenous contrast. RADIATION DOSE REDUCTION: This exam was performed according to the departmental dose-optimization program which includes automated exposure control, adjustment of the mA and/or kV according to patient size and/or use of iterative reconstruction technique. CONTRAST:  OMNIPAQUE IOHEXOL 300 MG/ML  SOLN COMPARISON:  CT abdomen and pelvis 11/13/2016. MRI abdomen 09/01/2023. FINDINGS: Lower chest: No acute abnormality. Hepatobiliary: Patient is status post cholecystectomy. Mild prominence of the common bile duct and intrahepatic bile ducts appear similar to prior. There  is a small amount of pneumobilia likely related to recent ERCP, new from prior. No focal liver lesions are identified. Pancreas: Unremarkable. No pancreatic ductal dilatation or surrounding inflammatory changes. Spleen: Normal in size without focal abnormality. Adrenals/Urinary Tract: Adrenal glands are unremarkable. Kidneys are normal, without renal calculi, focal lesion, or hydronephrosis. Bladder is unremarkable. Stomach/Bowel: There are postsurgical changes in the stomach. Appendix appears normal. No evidence of bowel wall thickening, distention, or inflammatory changes. Vascular/Lymphatic: No significant vascular  findings are present. No enlarged abdominal or pelvic lymph nodes. Reproductive: Status post hysterectomy. No adnexal masses. Other: No abdominal wall hernia or abnormality. No abdominopelvic ascites. Musculoskeletal: There are degenerative changes of the lower lumbar spine. IMPRESSION: 1. No acute localizing process in the abdomen or pelvis. 2. Status post cholecystectomy with mild prominence of the common bile duct and intrahepatic bile ducts, similar to prior. 3. Small amount of pneumobilia likely related to recent ERCP. Electronically Signed   By: Darliss Cheney M.D.   On: 09/03/2023 01:14    Scheduled Meds:  enoxaparin (LOVENOX) injection  40 mg Subcutaneous QPM   pantoprazole (PROTONIX) IV  40 mg Intravenous BID   saccharomyces boulardii  250 mg Oral BID   sertraline  50 mg Oral BH-q7a   Continuous Infusions:  ceFEPime (MAXIPIME) IV 2 g (09/03/23 1019)   lactated ringers 150 mL/hr (09/03/23 1127)   metronidazole 500 mg (09/03/23 1109)   potassium PHOSPHATE IVPB (in mmol) 30 mmol (09/03/23 1103)   PRN Meds: acetaminophen **OR** acetaminophen, HYDROcodone-acetaminophen, HYDROmorphone (DILAUDID) injection, loperamide, morphine injection, ondansetron **OR** ondansetron (ZOFRAN) IV  Time spent: 55 minutes  Author: Gillis Santa. MD [facility] 09/03/2023 2:20 PM  To reach On-call, see care teams to locate the attending and reach out to them via [email or web address]. If 7PM-7AM, please contact night-coverage If you still have difficulty reaching the attending provider, please page the [facility] (Director on Call) for [facility] on amion for assistance.

## 2023-09-04 DIAGNOSIS — K831 Obstruction of bile duct: Secondary | ICD-10-CM | POA: Diagnosis not present

## 2023-09-04 LAB — BASIC METABOLIC PANEL
Anion gap: 6 (ref 5–15)
BUN: 6 mg/dL (ref 6–20)
CO2: 23 mmol/L (ref 22–32)
Calcium: 7.9 mg/dL — ABNORMAL LOW (ref 8.9–10.3)
Chloride: 107 mmol/L (ref 98–111)
Creatinine, Ser: 0.7 mg/dL (ref 0.44–1.00)
GFR, Estimated: >60 mL/min (ref >60)
Glucose, Bld: 93 mg/dL (ref 70–99)
Potassium: 3.4 mmol/L — ABNORMAL LOW (ref 3.5–5.1)
Sodium: 136 mmol/L (ref 135–145)

## 2023-09-04 LAB — HEPATIC FUNCTION PANEL
ALT: 76 U/L — ABNORMAL HIGH (ref 0–44)
AST: 66 U/L — ABNORMAL HIGH (ref 15–41)
Albumin: 2.7 g/dL — ABNORMAL LOW (ref 3.5–5.0)
Alkaline Phosphatase: 363 U/L — ABNORMAL HIGH (ref 38–126)
Bilirubin, Direct: 1.9 mg/dL — ABNORMAL HIGH (ref 0.0–0.2)
Indirect Bilirubin: 1.5 mg/dL — ABNORMAL HIGH (ref 0.3–0.9)
Total Bilirubin: 3.4 mg/dL — ABNORMAL HIGH (ref 0.3–1.2)
Total Protein: 6.3 g/dL — ABNORMAL LOW (ref 6.5–8.1)

## 2023-09-04 LAB — CBC
HCT: 30.9 % — ABNORMAL LOW (ref 36.0–46.0)
Hemoglobin: 10.5 g/dL — ABNORMAL LOW (ref 12.0–15.0)
MCH: 29.2 pg (ref 26.0–34.0)
MCHC: 34 g/dL (ref 30.0–36.0)
MCV: 85.8 fL (ref 80.0–100.0)
Platelets: 252 10*3/uL (ref 150–400)
RBC: 3.6 MIL/uL — ABNORMAL LOW (ref 3.87–5.11)
RDW: 12.4 % (ref 11.5–15.5)
WBC: 6.4 10*3/uL (ref 4.0–10.5)
nRBC: 0 % (ref 0.0–0.2)

## 2023-09-04 LAB — MAGNESIUM: Magnesium: 1.8 mg/dL (ref 1.7–2.4)

## 2023-09-04 LAB — PHOSPHORUS: Phosphorus: 1.9 mg/dL — ABNORMAL LOW (ref 2.5–4.6)

## 2023-09-04 MED ORDER — MIDODRINE HCL 5 MG PO TABS
10.0000 mg | ORAL_TABLET | Freq: Three times a day (TID) | ORAL | Status: DC
  Administered 2023-09-04 (×2): 10 mg via ORAL
  Filled 2023-09-04 (×3): qty 2

## 2023-09-04 MED ORDER — SODIUM CHLORIDE 0.9 % IV SOLN: INTRAVENOUS | Status: AC

## 2023-09-04 MED ORDER — POTASSIUM PHOSPHATES 15 MMOLE/5ML IV SOLN
30.0000 mmol | Freq: Once | INTRAVENOUS | Status: AC
  Administered 2023-09-04: 30 mmol via INTRAVENOUS
  Filled 2023-09-04: qty 10

## 2023-09-04 NOTE — Plan of Care (Signed)

## 2023-09-04 NOTE — Progress Notes (Signed)
GI Inpatient Follow-up Note  Subjective:  Patient seen and doing well. Apparently right before temperature taking this morning, she had some coffee. Feels good with no abdominal pain.  Scheduled Inpatient Medications:   ascorbic acid  500 mg Oral Daily   enoxaparin (LOVENOX) injection  40 mg Subcutaneous QPM   iron polysaccharides  150 mg Oral Daily   midodrine  10 mg Oral TID WC   pantoprazole (PROTONIX) IV  40 mg Intravenous BID   saccharomyces boulardii  250 mg Oral BID   sertraline  50 mg Oral BH-q7a    Continuous Inpatient Infusions:    sodium chloride 75 mL/hr at 09/04/23 0935   ceFEPime (MAXIPIME) IV 2 g (09/04/23 1504)   metronidazole 500 mg (09/04/23 1120)   potassium PHOSPHATE IVPB (in mmol) 30 mmol (09/04/23 1123)    PRN Inpatient Medications:  acetaminophen **OR** acetaminophen, HYDROcodone-acetaminophen, HYDROmorphone (DILAUDID) injection, loperamide, morphine injection, ondansetron **OR** ondansetron (ZOFRAN) IV  Review of Systems:  Review of Systems  Constitutional:  Negative for chills, malaise/fatigue and weight loss.  Eyes:  Negative for double vision and photophobia.  Respiratory:  Negative for shortness of breath.   Cardiovascular:  Negative for chest pain.  Gastrointestinal:  Negative for abdominal pain, blood in stool and melena.  Musculoskeletal:  Negative for joint pain.  Skin:  Negative for rash.  Neurological:  Negative for focal weakness.  Psychiatric/Behavioral:  Negative for substance abuse.   All other systems reviewed and are negative.     Physical Examination: BP 110/68 (BP Location: Right Arm)   Pulse 81   Temp (!) 100.4 F (38 C) (Oral)   Resp 18   Ht 5\' 5"  (1.651 m)   Wt 80.7 kg   LMP 04/24/2018   SpO2 97%   BMI 29.62 kg/m  Gen: NAD, alert and oriented x 4 HEENT: PEERLA, EOMI, Neck: supple, no JVD or thyromegaly Chest: No respiratory distress Abd: soft, NT, ND Ext: no edema, well perfused with 2+ pulses, Skin: no rash or  lesions noted Lymph: no LAD  Data: Lab Results  Component Value Date   WBC 6.4 09/04/2023   HGB 10.5 (L) 09/04/2023   HCT 30.9 (L) 09/04/2023   MCV 85.8 09/04/2023   PLT 252 09/04/2023   Recent Labs  Lab 09/02/23 2335 09/03/23 0231 09/04/23 0545  HGB 11.8* 10.9* 10.5*   Lab Results  Component Value Date   NA 136 09/04/2023   K 3.4 (L) 09/04/2023   CL 107 09/04/2023   CO2 23 09/04/2023   BUN 6 09/04/2023   CREATININE 0.70 09/04/2023   Lab Results  Component Value Date   ALT 76 (H) 09/04/2023   AST 66 (H) 09/04/2023   ALKPHOS 363 (H) 09/04/2023   BILITOT 3.4 (H) 09/04/2023   Recent Labs  Lab 09/02/23 2335  APTT 36  INR 1.4*   Assessment/Plan: Bethany Flores is a 57 y.o. lady with abnormal LFT's, concerning for choledocholithiasis who is s/p ERCP with post procedural pain likely secondary to gas. Pain resolved. LFT's improving  Recommendations:  - will f/u labs an outpatient - if clinically stable tomorrow, ok for discharge with outpatient follow-up  Please call with any questions or concerns.  Bethany Lot MD, MPH [facility]

## 2023-09-04 NOTE — Progress Notes (Signed)
[facility] Progress Note  Patient: Bethany Flores    JWJ:[id number]  DOA: 09/01/2023     Date of Service: the patient was seen and examined on 09/04/2023  Chief Complaint  Patient presents with   Abdominal Pain   Diarrhea   Brief hospital course: Bethany Flores is a 57 y.o. female with medical history significant for Depression and anxiety, DDD, who is s/p, bariatric surgery 2015, cholecystectomy 2016 with postcholecystectomy choledocholithiasis a year later in 2017 undergoing ERCP with sphincterotomy and stent placement who presents with 3-day history of upper abdominal pain and diarrhea.  She denies nausea and vomiting.  She went to urgent care where they found her to have elevated LFTs (AST/ALT 78/87) and WBC 11.1 and they sent her to the ED for evaluation.  She denies fever and chills, cough, chest pain or shortness of breath. ED course and data review: Vitals within normal limits Labs significant for normal lipase, AST/ALT 108/100, alk phos 283 and total bilirubin 3.  CMP otherwise unremarkable CBC with leukocytosis of 11,000 otherwise unremarkable Urinalysis unremarkable EKG personally viewed and interpreted showing NSR at 96 with no concerning ST-T wave changes Right upper quadrant ultrasound showed no abnormality MRCP: IMPRESSION: 1. Surgical absence of the gallbladder. 2. Mild intrahepatic and extrahepatic bile duct dilatation is likely normal for postoperative physiology. 3. Poorly defined filling defects are seen in the cystic duct and common bile duct, most likely artifact but could indicate retained stone or sludge. Consider ERCP for further evaluation if clinically indicated.   The ED provider had spoken earlier to gastroenterologist Dr. Servando Snare who had recommended MRCP. Patient was treated with Percocet and a couple rounds of morphine, Zofran.  She was placed n.p.o based on recommendations of MRCP.Marland Kitchen Hospitalist consulted for admission.    Assessment and Plan:  # CBD  obstruction due to sludge Presented with intractable abdominal pain MRCP reviewed as above GI consulted, s/p ERCP, sludge removed.  Complaining of severe abdominal pain postprocedure possible due to pancreatitis C. difficile and GI pathogen negative.  Lipase within normal range Elevated LFTs Continue as needed medication for pain control continue IV fluids, Follow GI for further recommendation 10/16 started regular diet, patient is tolerating well Started PPI for GI prophylaxis Trend LFTs  # Sepsis, developed s/p ERCP 10/16 patient spiked a fever last night, was started on IV antibiotics  CT A/P repeated, no postprocedure complications Started cefepime 2 g IV every 8 hourly and Flagyl 500 mg IV twice daily Follow blood cultures NGTD IV fluid was given as per sepsis protocol Continue IV fluid and monitor vital signs 10/17 low-grade fever and soft blood pressure, continue IV fluid, started midodrine with holding parameters. Possible discharge tomorrow a.m. if remains stable and afebrile  # Diarrhea most likely secondary to sepsis Started Imodium as needed Started probiotics Monitor electrolytes  Electrolyte imbalance secondary to diarrhea # Hypokalemia, potassium repleted # Hypophosphatemia, Phos repleted. # Hypomagnesemia, mag repleted. Monitor electrolytes  # Hypoglycemia due to n.p.o. status, resolved  S/p D50 1 amp given, started D5 NS IV fluid Monitor CBG  # Depression, resumed Zoloft.  #Iron deficiency, transferrin saturation 10% Folic acid and B12 level within normal range Avoided IV iron secondary to infection Start oral iron supplement with vitamin C on discharge Hypocalcemia, due to low albumin.  Vitamin D within normal range.   Body mass index is 29.62 kg/m.  Interventions:  Diet: Regular diet DVT Prophylaxis: Subcutaneous Lovenox   Advance goals of care discussion: Full code  Family  Communication: family was present at bedside, at the time of  interview.  The pt provided permission to discuss medical plan with the family. Opportunity was given to ask question and all questions were answered satisfactorily.   Disposition:  Pt is from home, admitted with CBD dilatation s/p ERCP, still has electrolyte imbalance, low blood pressure on IVF and midodrine, which precludes a safe discharge. Discharge to Home, when stable.  Most likely tomorrow a.m. if remains afebrile  Subjective: Patient was seen and examined at bedside in the morning. No significant events overnight, diarrhea almost resolved.  No nausea vomiting, no abdominal pain.  Tolerating diet well. Patient still has a low blood pressure, started IV fluid and midodrine, still has low phosphorus which is replaced IV.  Patient had low-grade fever 100.4 Patient is to stay overnight to make sure she is afebrile for 24 hours and vital signs stable.  Plan is to discharge tomorrow a.m.   Physical Exam: General: NAD, lying comfortably Appear in no distress, affect appropriate Eyes: PERRLA ENT: Oral Mucosa Clear, moist  Neck: no JVD,  Cardiovascular: S1 and S2 Present, no Murmur,  Respiratory: good respiratory effort, Bilateral Air entry equal and Decreased, no Crackles, no wheezes Abdomen: Bowel Sound present, Soft and Mild upper abd tenderness,  Skin: no rashes Extremities: no Pedal edema, no calf tenderness Neurologic: without any new focal findings Gait not checked due to patient safety concerns  Vitals:   09/03/23 1724 09/04/23 0322 09/04/23 0747 09/04/23 1311  BP:  100/65 (!) 84/65 110/68  Pulse:  75 88 81  Resp: 19 20 18    Temp:  98.9 F (37.2 C) (!) 100.4 F (38 C)   TempSrc:  Oral Oral   SpO2:  (!) 77% 97%   Weight:      Height:        Intake/Output Summary (Last 24 hours) at 09/04/2023 1415 Last data filed at 09/04/2023 0900 Gross per 24 hour  Intake 2587.5 ml  Output --  Net 2587.5 ml   Filed Weights   09/01/23 1237  Weight: 80.7 kg    Data  Reviewed: I have personally reviewed and interpreted daily labs, tele strips, imagings as discussed above. I reviewed all nursing notes, pharmacy notes, vitals, pertinent old records I have discussed plan of care as described above with RN and patient/family.  CBC: Recent Labs  Lab 09/02/23 0345 09/02/23 1358 09/02/23 2335 09/03/23 0231 09/04/23 0545  WBC 8.7 8.0 11.7* 10.8* 6.4  NEUTROABS  --   --  10.8*  --   --   HGB 11.3* 12.0 11.8* 10.9* 10.5*  HCT 32.6* 34.7* 34.1* 31.1* 30.9*  MCV 85.3 85.7 85.5 85.0 85.8  PLT 254 299 266 248 252   Basic Metabolic Panel: Recent Labs  Lab 09/02/23 0345 09/02/23 1358 09/02/23 2335 09/03/23 0231 09/04/23 0545  NA 136 135 133* 135 136  K 3.6 3.3* 3.0* 3.2* 3.4*  CL 102 101 103 107 107  CO2 22 21* 20* 21* 23  GLUCOSE 66* 108* 147* 124* 93  BUN 16 13 9 7 6   CREATININE 0.77 0.63 0.64 0.73 0.70  CALCIUM 8.4* 8.4* 8.1* 7.6* 7.9*  MG 1.8 1.7  --  1.4* 1.8  PHOS 3.0 3.0  --  2.2* 1.9*    Studies: No results found.  Scheduled Meds:  ascorbic acid  500 mg Oral Daily   enoxaparin (LOVENOX) injection  40 mg Subcutaneous QPM   iron polysaccharides  150 mg Oral Daily   midodrine  10 mg Oral TID WC   pantoprazole (PROTONIX) IV  40 mg Intravenous BID   saccharomyces boulardii  250 mg Oral BID   sertraline  50 mg Oral BH-q7a   Continuous Infusions:  sodium chloride 75 mL/hr at 09/04/23 0935   ceFEPime (MAXIPIME) IV 2 g (09/04/23 0835)   metronidazole 500 mg (09/04/23 1120)   potassium PHOSPHATE IVPB (in mmol) 30 mmol (09/04/23 1123)   PRN Meds: acetaminophen **OR** acetaminophen, HYDROcodone-acetaminophen, HYDROmorphone (DILAUDID) injection, loperamide, morphine injection, ondansetron **OR** ondansetron (ZOFRAN) IV  Time spent: 55 minutes  Author: Gillis Santa. MD [facility] 09/04/2023 2:15 PM  To reach On-call, see care teams to locate the attending and reach out to them via [email or web address]. If 7PM-7AM, please contact  night-coverage If you still have difficulty reaching the attending provider, please page the [facility] (Director on Call) for [facility] on amion for assistance.

## 2023-09-05 DIAGNOSIS — R7989 Other specified abnormal findings of blood chemistry: Secondary | ICD-10-CM | POA: Diagnosis not present

## 2023-09-05 DIAGNOSIS — K831 Obstruction of bile duct: Secondary | ICD-10-CM | POA: Diagnosis not present

## 2023-09-05 DIAGNOSIS — R1011 Right upper quadrant pain: Secondary | ICD-10-CM | POA: Diagnosis not present

## 2023-09-05 LAB — HEPATIC FUNCTION PANEL
ALT: 69 U/L — ABNORMAL HIGH (ref 0–44)
AST: 63 U/L — ABNORMAL HIGH (ref 15–41)
Albumin: 2.5 g/dL — ABNORMAL LOW (ref 3.5–5.0)
Alkaline Phosphatase: 319 U/L — ABNORMAL HIGH (ref 38–126)
Bilirubin, Direct: 2 mg/dL — ABNORMAL HIGH (ref 0.0–0.2)
Indirect Bilirubin: 1.3 mg/dL — ABNORMAL HIGH (ref 0.3–0.9)
Total Bilirubin: 3.3 mg/dL — ABNORMAL HIGH (ref 0.3–1.2)
Total Protein: 6 g/dL — ABNORMAL LOW (ref 6.5–8.1)

## 2023-09-05 LAB — BASIC METABOLIC PANEL
Anion gap: 7 (ref 5–15)
BUN: 6 mg/dL (ref 6–20)
CO2: 24 mmol/L (ref 22–32)
Calcium: 7.9 mg/dL — ABNORMAL LOW (ref 8.9–10.3)
Chloride: 107 mmol/L (ref 98–111)
Creatinine, Ser: 0.6 mg/dL (ref 0.44–1.00)
GFR, Estimated: >60 mL/min (ref >60)
Glucose, Bld: 98 mg/dL (ref 70–99)
Potassium: 4 mmol/L (ref 3.5–5.1)
Sodium: 138 mmol/L (ref 135–145)

## 2023-09-05 LAB — CBC
HCT: 29 % — ABNORMAL LOW (ref 36.0–46.0)
Hemoglobin: 9.8 g/dL — ABNORMAL LOW (ref 12.0–15.0)
MCH: 29.3 pg (ref 26.0–34.0)
MCHC: 33.8 g/dL (ref 30.0–36.0)
MCV: 86.6 fL (ref 80.0–100.0)
Platelets: 230 10*3/uL (ref 150–400)
RBC: 3.35 MIL/uL — ABNORMAL LOW (ref 3.87–5.11)
RDW: 12.6 % (ref 11.5–15.5)
WBC: 5.6 10*3/uL (ref 4.0–10.5)
nRBC: 0 % (ref 0.0–0.2)

## 2023-09-05 LAB — MAGNESIUM: Magnesium: 1.7 mg/dL (ref 1.7–2.4)

## 2023-09-05 LAB — ANTINUCLEAR ANTIBODIES, IFA: ANA Ab, IFA: NEGATIVE

## 2023-09-05 LAB — MITOCHONDRIAL ANTIBODIES: Mitochondrial M2 Ab, IgG: <20 U (ref 0.0–20.0)

## 2023-09-05 LAB — PHOSPHORUS: Phosphorus: 2.6 mg/dL (ref 2.5–4.6)

## 2023-09-05 LAB — ANTI-MICROSOMAL ANTIBODY LIVER / KIDNEY: LKM1 Ab: 0.6 U (ref 0.0–20.0)

## 2023-09-05 MED ORDER — AMOXICILLIN-POT CLAVULANATE 875-125 MG PO TABS: 1.0000 | ORAL_TABLET | Freq: Two times a day (BID) | ORAL | 0 refills | Status: AC

## 2023-09-05 MED ORDER — POLYSACCHARIDE IRON COMPLEX 150 MG PO CAPS: 150.0000 mg | ORAL_CAPSULE | Freq: Every day | ORAL | 0 refills | Status: AC

## 2023-09-05 MED ORDER — SACCHAROMYCES BOULARDII 250 MG PO CAPS: 250.0000 mg | ORAL_CAPSULE | Freq: Two times a day (BID) | ORAL | 1 refills | Status: AC

## 2023-09-05 MED ORDER — ASCORBIC ACID 500 MG/5ML PO LIQD: 500.0000 mg | Freq: Every day | ORAL | 0 refills | Status: AC

## 2023-09-05 NOTE — Plan of Care (Signed)
  Problem: Clinical Measurements: Goal: Will remain free from infection Outcome: Progressing Goal: Diagnostic test results will improve Outcome: Progressing   Problem: Activity: Goal: Risk for activity intolerance will decrease Outcome: Progressing   Problem: Nutrition: Goal: Adequate nutrition will be maintained Outcome: Progressing   Problem: Coping: Goal: Level of anxiety will decrease Outcome: Progressing   Problem: Elimination: Goal: Will not experience complications related to bowel motility Outcome: Progressing   Problem: Safety: Goal: Ability to remain free from injury will improve Outcome: Progressing

## 2023-09-05 NOTE — Discharge Summary (Signed)
Physician Discharge Summary   Patient: Bethany Flores MRN: [id number] DOB: [birth year]  Admit date:     09/01/2023  Discharge date: 09/05/23  Discharge Physician: Arnetha Courser   PCP: Dorothey Baseman, MD   Recommendations at discharge:  Please obtain CBC and CMP on follow-up. Follow-up with primary care provider Follow-up with gastroenterology  Discharge Diagnoses: Principal Problem:   Cholestasis, possible choledocholithiasis Active Problems:   Right upper quadrant pain   Depression   GERD (gastroesophageal reflux disease)   Elevated LFTs   Hospital Course: Bethany Flores is a 57 y.o. female with medical history significant for Depression and anxiety, DDD, who is s/p, bariatric surgery 2015, cholecystectomy 2016 with postcholecystectomy choledocholithiasis a year later in 2017 undergoing ERCP with sphincterotomy and stent placement who presents with 3-day history of upper abdominal pain and diarrhea.  She denies nausea and vomiting.  She went to urgent care where they found her to have elevated LFTs (AST/ALT 78/87) and WBC 11.1 and they sent her to the ED for evaluation.  She denies fever and chills, cough, chest pain or shortness of breath. ED course and data review: Vitals within normal limits Labs significant for normal lipase, AST/ALT 108/100, alk phos 283 and total bilirubin 3.  CMP otherwise unremarkable CBC with leukocytosis of 11,000 otherwise unremarkable Urinalysis unremarkable EKG personally viewed and interpreted showing NSR at 96 with no concerning ST-T wave changes Right upper quadrant ultrasound showed no abnormality MRCP: IMPRESSION: 1. Surgical absence of the gallbladder. 2. Mild intrahepatic and extrahepatic bile duct dilatation is likely normal for postoperative physiology. 3. Poorly defined filling defects are seen in the cystic duct and common bile duct, most likely artifact but could indicate retained stone or sludge.  Patient underwent ERCP with removal of  sludge.  Patient did had some abdominal pain afterwards likely due to postprocedural pancreatitis.  C. difficile and GI pathogen panel negative.  Lipase remained within normal limit.  Pain slowly resolved.  Patient did had 1 day of fever which resolved on its own.  She was started on broad-spectrum antibiotics and discharged on 3 more days of Augmentin to complete the course.  Her symptoms improved.  Had multiple electrolyte abnormalities which were repleted before discharge.  Patient will continue with rest of her home medications and need to have a close follow-up with her providers for further management.  Consultants: Gastroenterology Procedures performed: ERCP Disposition: Home Diet recommendation:  Discharge Diet Orders (From admission, onward)     Start     Ordered   09/05/23 0000  Diet - low sodium heart healthy        09/05/23 1348           Regular diet DISCHARGE MEDICATION: Allergies as of 09/05/2023   No Known Allergies      Medication List     STOP taking these medications    metroNIDAZOLE 0.75 % cream Commonly known as: METROCREAM       TAKE these medications    acetaminophen 500 MG tablet Commonly known as: TYLENOL Take 1,000 mg by mouth every 6 (six) hours as needed.   amoxicillin-clavulanate 875-125 MG tablet Commonly known as: AUGMENTIN Take 1 tablet by mouth 2 (two) times daily for 3 days.   ascorbic acid 500 MG/5ML Liqd liquid Commonly known as: VITAMIN C Take 5 mLs (500 mg total) by mouth daily. Start taking on: September 06, 2023   Calcium Carbonate-Vitamin D 600-200 MG-UNIT Tabs Take 1 tablet by mouth daily with lunch.   Fish  Oil 1000 MG Caps Take by mouth.   iron polysaccharides 150 MG capsule Commonly known as: NIFEREX Take 1 capsule (150 mg total) by mouth daily. Start taking on: September 06, 2023   Multivitamin Adult Tabs Take 1 tablet by mouth daily.   saccharomyces boulardii 250 MG capsule Commonly known as:  FLORASTOR Take 1 capsule (250 mg total) by mouth 2 (two) times daily.   sertraline 100 MG tablet Commonly known as: ZOLOFT Take 100 mg by mouth daily.   Zepbound 5 MG/0.5ML Pen Generic drug: tirzepatide Inject 5 mg into the skin once a week.        Follow-up Information     Dorothey Baseman, MD. Schedule an appointment as soon as possible for a visit in 1 week(s).   Specialty: Family Medicine Contact information: [address] [place] [address] [phone]         Regis Bill, MD. Schedule an appointment as soon as possible for a visit in 1 week(s).   Specialty: Gastroenterology Contact information: [address] [place] [address] [phone]                Discharge Exam: Ceasar Mons Weights   09/01/23 1237  Weight: 80.7 kg   General.  Well-developed lady, in no acute distress. Pulmonary.  Lungs clear bilaterally, normal respiratory effort. CV.  Regular rate and rhythm, no JVD, rub or murmur. Abdomen.  Soft, nontender, nondistended, BS positive. CNS.  Alert and oriented .  No focal neurologic deficit. Extremities.  No edema, no cyanosis, pulses intact and symmetrical. Psychiatry.  Judgment and insight appears normal.   Condition at discharge: stable  The results of significant diagnostics from this hospitalization (including imaging, microbiology, ancillary and laboratory) are listed below for reference.   Imaging Studies: CT ABDOMEN PELVIS W CONTRAST  Result Date: 09/03/2023 CLINICAL DATA:  Postoperative abdominal pain status post ERCP. EXAM: CT ABDOMEN AND PELVIS WITH CONTRAST TECHNIQUE: Multidetector CT imaging of the abdomen and pelvis was performed using the standard protocol following bolus administration of intravenous contrast. RADIATION DOSE REDUCTION: This exam was performed according to the departmental dose-optimization program which includes automated exposure control, adjustment of the mA and/or kV according to patient  size and/or use of iterative reconstruction technique. CONTRAST:  OMNIPAQUE IOHEXOL 300 MG/ML  SOLN COMPARISON:  CT abdomen and pelvis 11/13/2016. MRI abdomen 09/01/2023. FINDINGS: Lower chest: No acute abnormality. Hepatobiliary: Patient is status post cholecystectomy. Mild prominence of the common bile duct and intrahepatic bile ducts appear similar to prior. There is a small amount of pneumobilia likely related to recent ERCP, new from prior. No focal liver lesions are identified. Pancreas: Unremarkable. No pancreatic ductal dilatation or surrounding inflammatory changes. Spleen: Normal in size without focal abnormality. Adrenals/Urinary Tract: Adrenal glands are unremarkable. Kidneys are normal, without renal calculi, focal lesion, or hydronephrosis. Bladder is unremarkable. Stomach/Bowel: There are postsurgical changes in the stomach. Appendix appears normal. No evidence of bowel wall thickening, distention, or inflammatory changes. Vascular/Lymphatic: No significant vascular findings are present. No enlarged abdominal or pelvic lymph nodes. Reproductive: Status post hysterectomy. No adnexal masses. Other: No abdominal wall hernia or abnormality. No abdominopelvic ascites. Musculoskeletal: There are degenerative changes of the lower lumbar spine. IMPRESSION: 1. No acute localizing process in the abdomen or pelvis. 2. Status post cholecystectomy with mild prominence of the common bile duct and intrahepatic bile ducts, similar to prior. 3. Small amount of pneumobilia likely related to recent ERCP. Electronically Signed   By: Darliss Cheney  M.D.   On: 09/03/2023 01:14   DG C-Arm 1-60 Min-No Report  Result Date: 09/02/2023 Fluoroscopy was utilized by the requesting physician.  No radiographic interpretation.   MR ABDOMEN MRCP W WO CONTAST  Result Date: 09/01/2023 CLINICAL DATA:  Cholelithiasis. Upper abdominal pain with elevated liver function studies. EXAM: MRI ABDOMEN WITHOUT AND WITH CONTRAST  (INCLUDING MRCP) TECHNIQUE: Multiplanar multisequence MR imaging of the abdomen was performed both before and after the administration of intravenous contrast. Heavily T2-weighted images of the biliary and pancreatic ducts were obtained, and three-dimensional MRCP images were rendered by post processing. CONTRAST:  7mL GADAVIST GADOBUTROL 1 MMOL/ML IV SOLN COMPARISON:  Ultrasound abdomen 09/01/2023. CT abdomen and pelvis 11/13/2016 FINDINGS: Lower chest: No acute findings. Hepatobiliary: The gallbladder is surgically absent. There is mild intra and extrahepatic bile duct dilatation, likely normal for postoperative physiology. Poorly defined filling defects demonstrated in the cystic duct and common bile ducts, most likely artifact but retained stone or sludge is possible. Consider ERCP for further evaluation if clinically indicated. No focal liver lesions are identified. Pancreas: No mass, inflammatory changes, or other parenchymal abnormality identified. Spleen:  Within normal limits in size and appearance. Adrenals/Urinary Tract: No masses identified. No evidence of hydronephrosis. Stomach/Bowel: Visualized portions within the abdomen are unremarkable. Vascular/Lymphatic: No pathologically enlarged lymph nodes identified. No abdominal aortic aneurysm demonstrated. Other:  No upper abdominal ascites or free air is identified. Musculoskeletal: No suspicious bone lesions identified. IMPRESSION: 1. Surgical absence of the gallbladder. 2. Mild intrahepatic and extrahepatic bile duct dilatation is likely normal for postoperative physiology. 3. Poorly defined filling defects are seen in the cystic duct and common bile duct, most likely artifact but could indicate retained stone or sludge. Consider ERCP for further evaluation if clinically indicated. Electronically Signed   By: Burman Nieves M.D.   On: 09/01/2023 23:53   MR 3D Recon At Scanner  Result Date: 09/01/2023 CLINICAL DATA:  Cholelithiasis. Upper  abdominal pain with elevated liver function studies. EXAM: MRI ABDOMEN WITHOUT AND WITH CONTRAST (INCLUDING MRCP) TECHNIQUE: Multiplanar multisequence MR imaging of the abdomen was performed both before and after the administration of intravenous contrast. Heavily T2-weighted images of the biliary and pancreatic ducts were obtained, and three-dimensional MRCP images were rendered by post processing. CONTRAST:  7mL GADAVIST GADOBUTROL 1 MMOL/ML IV SOLN COMPARISON:  Ultrasound abdomen 09/01/2023. CT abdomen and pelvis 11/13/2016 FINDINGS: Lower chest: No acute findings. Hepatobiliary: The gallbladder is surgically absent. There is mild intra and extrahepatic bile duct dilatation, likely normal for postoperative physiology. Poorly defined filling defects demonstrated in the cystic duct and common bile ducts, most likely artifact but retained stone or sludge is possible. Consider ERCP for further evaluation if clinically indicated. No focal liver lesions are identified. Pancreas: No mass, inflammatory changes, or other parenchymal abnormality identified. Spleen:  Within normal limits in size and appearance. Adrenals/Urinary Tract: No masses identified. No evidence of hydronephrosis. Stomach/Bowel: Visualized portions within the abdomen are unremarkable. Vascular/Lymphatic: No pathologically enlarged lymph nodes identified. No abdominal aortic aneurysm demonstrated. Other:  No upper abdominal ascites or free air is identified. Musculoskeletal: No suspicious bone lesions identified. IMPRESSION: 1. Surgical absence of the gallbladder. 2. Mild intrahepatic and extrahepatic bile duct dilatation is likely normal for postoperative physiology. 3. Poorly defined filling defects are seen in the cystic duct and common bile duct, most likely artifact but could indicate retained stone or sludge. Consider ERCP for further evaluation if clinically indicated. Electronically Signed   By: Marisa Cyphers.D.  On: 09/01/2023 23:53    US ABDOMEN LIMITED RUQ (LIVER/GB)  Result Date: 09/01/2023 CLINICAL DATA:  Upper abdominal pain Elevated liver function tests EXAM: ULTRASOUND ABDOMEN LIMITED RIGHT UPPER QUADRANT COMPARISON:  CT abdomen pelvis 11/13/2016 FINDINGS: Gallbladder: Surgically absent Common bile duct: Diameter: 9 mm Liver: Parenchymal echogenicity: Within normal limits Contours: Normal Lesions: None Portal vein: Patent.  Hepatopetal flow Other: None. IMPRESSION: No significant sonographic abnormality of the liver or bile ducts. Electronically Signed   By: Acquanetta Belling M.D.   On: 09/01/2023 16:05    Microbiology: Results for orders placed or performed during the hospital encounter of 09/01/23  Culture, blood (x 2)     Status: None (Preliminary result)   Collection Time: 09/02/23 11:35 PM   Specimen: BLOOD  Result Value Ref Range Status   Specimen Description BLOOD BLOOD RIGHT ARM  Final   Special Requests   Final    BOTTLES DRAWN AEROBIC AND ANAEROBIC Blood Culture adequate volume   Culture   Final    NO GROWTH 3 DAYS Performed at [facility], [address], [place], [place] [address]    Report Status PENDING  Incomplete  Culture, blood (x 2)     Status: None (Preliminary result)   Collection Time: 09/02/23 11:35 PM   Specimen: BLOOD  Result Value Ref Range Status   Specimen Description BLOOD BLOOD LEFT ARM  Final   Special Requests   Final    BOTTLES DRAWN AEROBIC AND ANAEROBIC Blood Culture adequate volume   Culture   Final    NO GROWTH 3 DAYS Performed at [facility], [address], [place], [place] [address]    Report Status PENDING  Incomplete  Gastrointestinal Panel by PCR , Stool     Status: None   Collection Time: 09/03/23  3:55 AM   Specimen: Stool  Result Value Ref Range Status   Campylobacter species NOT DETECTED NOT DETECTED Final   Plesimonas shigelloides NOT DETECTED NOT DETECTED Final   Salmonella species NOT DETECTED NOT DETECTED Final   Yersinia  enterocolitica NOT DETECTED NOT DETECTED Final   Vibrio species NOT DETECTED NOT DETECTED Final   Vibrio cholerae NOT DETECTED NOT DETECTED Final   Enteroaggregative E coli (EAEC) NOT DETECTED NOT DETECTED Final   Enteropathogenic E coli (EPEC) NOT DETECTED NOT DETECTED Final   Enterotoxigenic E coli (ETEC) NOT DETECTED NOT DETECTED Final   Shiga like toxin producing E coli (STEC) NOT DETECTED NOT DETECTED Final   Shigella/Enteroinvasive E coli (EIEC) NOT DETECTED NOT DETECTED Final   Cryptosporidium NOT DETECTED NOT DETECTED Final   Cyclospora cayetanensis NOT DETECTED NOT DETECTED Final   Entamoeba histolytica NOT DETECTED NOT DETECTED Final   Giardia lamblia NOT DETECTED NOT DETECTED Final   Adenovirus F40/41 NOT DETECTED NOT DETECTED Final   Astrovirus NOT DETECTED NOT DETECTED Final   Norovirus GI/GII NOT DETECTED NOT DETECTED Final   Rotavirus A NOT DETECTED NOT DETECTED Final   Sapovirus (I, II, IV, and V) NOT DETECTED NOT DETECTED Final    Comment: Performed at [facility], [address] [place], [place] [address]  C Difficile Quick Screen w PCR reflex     Status: None   Collection Time: 09/03/23  3:55 AM   Specimen: STOOL  Result Value Ref Range Status   C Diff antigen NEGATIVE NEGATIVE Final   C Diff toxin NEGATIVE NEGATIVE Final   C Diff interpretation No C. difficile detected.  Final    Comment: Performed at [facility]  [facility], [address] [place], [place] [address]    Labs: CBC: Recent Labs  Lab 09/02/23 1358 09/02/23 2335 09/03/23 0231 09/04/23 0545 09/05/23 0234  WBC 8.0 11.7* 10.8* 6.4 5.6  NEUTROABS  --  10.8*  --   --   --   HGB 12.0 11.8* 10.9* 10.5* 9.8*  HCT 34.7* 34.1* 31.1* 30.9* 29.0*  MCV 85.7 85.5 85.0 85.8 86.6  PLT 299 266 248 252 230   Basic Metabolic Panel: Recent Labs  Lab 09/02/23 0345 09/02/23 1358 09/02/23 2335 09/03/23 0231 09/04/23 0545 09/05/23 0234  NA 136 135 133* 135 136 138  K 3.6 3.3* 3.0* 3.2* 3.4*  4.0  CL 102 101 103 107 107 107  CO2 22 21* 20* 21* 23 24  GLUCOSE 66* 108* 147* 124* 93 98  BUN 16 13 9 7 6 6   CREATININE 0.77 0.63 0.64 0.73 0.70 0.60  CALCIUM 8.4* 8.4* 8.1* 7.6* 7.9* 7.9*  MG 1.8 1.7  --  1.4* 1.8 1.7  PHOS 3.0 3.0  --  2.2* 1.9* 2.6   Liver Function Tests: Recent Labs  Lab 09/02/23 1358 09/02/23 2335 09/03/23 0231 09/04/23 0545 09/05/23 0234  AST 86* 97* 75* 66* 63*  ALT 91* 116* 98* 76* 69*  ALKPHOS 527* 607* 498* 363* 319*  BILITOT 3.5* 5.2* 4.5* 3.4* 3.3*  PROT 7.4 7.4 6.5 6.3* 6.0*  ALBUMIN 3.5 3.5 2.9* 2.7* 2.5*   CBG: Recent Labs  Lab 09/02/23 1016 09/02/23 1328  GLUCAP 79 89    Discharge time spent: greater than 30 minutes.  This record has been created using Conservation officer, historic buildings. Errors have been sought and corrected,but may not always be located. Such creation errors do not reflect on the standard of care.   Signed: Arnetha Courser, MD [facility] 09/05/2023

## 2023-09-07 LAB — CULTURE, BLOOD (ROUTINE X 2)
Culture: NO GROWTH
Culture: NO GROWTH
Special Requests: ADEQUATE
Special Requests: ADEQUATE

## 2023-09-07 LAB — ANTI-SMOOTH MUSCLE ANTIBODY, IGG: F-Actin IgG: 6 U (ref 0–19)

## 2023-09-23 ENCOUNTER — Ambulatory Visit: Payer: BC Managed Care – PPO | Admitting: Dermatology

## 2024-01-27 ENCOUNTER — Ambulatory Visit: Payer: BC Managed Care – PPO | Admitting: Dermatology

## 2024-01-27 DIAGNOSIS — D1801 Hemangioma of skin and subcutaneous tissue: Secondary | ICD-10-CM

## 2024-01-27 DIAGNOSIS — L578 Other skin changes due to chronic exposure to nonionizing radiation: Secondary | ICD-10-CM

## 2024-01-27 DIAGNOSIS — D492 Neoplasm of unspecified behavior of bone, soft tissue, and skin: Secondary | ICD-10-CM | POA: Diagnosis not present

## 2024-01-27 DIAGNOSIS — L719 Rosacea, unspecified: Secondary | ICD-10-CM

## 2024-01-27 DIAGNOSIS — Z86007 Personal history of in-situ neoplasm of skin: Secondary | ICD-10-CM

## 2024-01-27 DIAGNOSIS — L814 Other melanin hyperpigmentation: Secondary | ICD-10-CM | POA: Diagnosis not present

## 2024-01-27 DIAGNOSIS — Z1283 Encounter for screening for malignant neoplasm of skin: Secondary | ICD-10-CM

## 2024-01-27 DIAGNOSIS — L570 Actinic keratosis: Secondary | ICD-10-CM | POA: Diagnosis not present

## 2024-01-27 DIAGNOSIS — Z86018 Personal history of other benign neoplasm: Secondary | ICD-10-CM

## 2024-01-27 DIAGNOSIS — L821 Other seborrheic keratosis: Secondary | ICD-10-CM | POA: Diagnosis not present

## 2024-01-27 DIAGNOSIS — W908XXA Exposure to other nonionizing radiation, initial encounter: Secondary | ICD-10-CM

## 2024-01-27 DIAGNOSIS — D2271 Melanocytic nevi of right lower limb, including hip: Secondary | ICD-10-CM

## 2024-01-27 DIAGNOSIS — D225 Melanocytic nevi of trunk: Secondary | ICD-10-CM | POA: Diagnosis not present

## 2024-01-27 DIAGNOSIS — Q825 Congenital non-neoplastic nevus: Secondary | ICD-10-CM

## 2024-01-27 DIAGNOSIS — D239 Other benign neoplasm of skin, unspecified: Secondary | ICD-10-CM

## 2024-01-27 DIAGNOSIS — D229 Melanocytic nevi, unspecified: Secondary | ICD-10-CM

## 2024-01-27 DIAGNOSIS — D2372 Other benign neoplasm of skin of left lower limb, including hip: Secondary | ICD-10-CM

## 2024-01-27 DIAGNOSIS — Z7189 Other specified counseling: Secondary | ICD-10-CM

## 2024-01-27 NOTE — Patient Instructions (Addendum)

## 2024-01-27 NOTE — Progress Notes (Unsigned)
 Follow-Up Visit   Subjective  Bethany Flores is a 58 y.o. female who presents for the following: Skin Cancer Screening and Full Body Skin Exam, spot at R flank, irritating.   Patient with history AK, Dysplastic Nevus, SCCIs  The patient presents for Total-Body Skin Exam (TBSE) for skin cancer screening and mole check. The patient has spots, moles and lesions to be evaluated, some may be new or changing and the patient may have concern these could be cancer.   The following portions of the chart were reviewed this encounter and updated as appropriate: medications, allergies, medical history  Review of Systems:  No other skin or systemic complaints except as noted in HPI or Assessment and Plan.  Objective  Well appearing patient in no apparent distress; mood and affect are within normal limits.  A full examination was performed including scalp, head, eyes, ears, nose, lips, neck, chest, axillae, abdomen, back, buttocks, bilateral upper extremities, bilateral lower extremities, hands, feet, fingers, toes, fingernails, and toenails. All findings within normal limits unless otherwise noted below.   Relevant physical exam findings are noted in the Assessment and Plan.  R medial cheek x1, L cheek x4, L upper eyebrow x1 (6) Pink scaly macules L spinal mid upper back 5mm brown speckled macule  R upper flank at braline 0.6cm fleshy brown papule   Assessment & Plan   SKIN CANCER SCREENING PERFORMED TODAY.  LENTIGINES, SEBORRHEIC KERATOSES, HEMANGIOMAS - Benign normal skin lesions - Benign-appearing - Call for any changes  MELANOCYTIC NEVI - Tan-brown and/or pink-flesh-colored symmetric macules and papules, 08/08/20 photos compared - R upper thigh at inguinal crease 4 x 3 mm med dark brown thin papule - Benign appearing on exam today, stable compared to previous photos.   - Observation - Call clinic for new or changing moles - Recommend daily use of broad spectrum spf 30+  sunscreen to sun-exposed areas.    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  Stable compared to previous photo. Observation.  Call clinic for new or changing moles.  Recommend daily use of broad spectrum spf 30+ sunscreen to sun-exposed areas.   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 R shoulder inf edge of scar with pink macule c/w scar with telangiectasia, txted with EDC and 5FU/Calcipotriene, no reaction/change from topical treatment.  Observe.  - Recommend regular full body skin exams - Recommend daily broad spectrum sunscreen SPF 30+ to sun-exposed areas, reapply every 2 hours as needed.  - Call if any new or changing lesions are noted between office visits   History of Dysplastic Nevi - No evidence of recurrence today - Recommend regular full body skin exams - Recommend daily broad spectrum sunscreen SPF 30+ to sun-exposed areas, reapply every 2 hours as needed.  - Call if any new or changing lesions are noted between office visits   ACTINIC DAMAGE WITH PRECANCEROUS ACTINIC KERATOSES Counseling for Topical Chemotherapy Management: Patient exhibits: - Severe, confluent actinic changes with pre-cancerous actinic keratoses that is secondary to cumulative UV radiation exposure over time - Condition that is severe; chronic, not at goal. - diffuse scaly erythematous macules and papules with underlying dyspigmentation - Discussed Prescription "Field Treatment" topical Chemotherapy for Severe, Chronic Confluent Actinic Changes with Pre-Cancerous Actinic Keratoses Field treatment involves treatment of an entire area of skin that has confluent Actinic Changes (Sun/ Ultraviolet light damage) and PreCancerous Actinic Keratoses by  method of PhotoDynamic Therapy (PDT) and/or prescription Topical Chemotherapy agents such as 5-fluorouracil,  5-fluorouracil/calcipotriene, and/or imiquimod.  The purpose is to decrease the number of clinically evident and subclinical PreCancerous lesions to prevent progression to development of skin cancer by chemically destroying early precancer changes that may or may not be visible.  It has been shown to reduce the risk of developing skin cancer in the treated area. As a result of treatment, redness, scaling, crusting, and open sores may occur during treatment course. One or more than one of these methods may be used and may have to be used several times to control, suppress and eliminate the PreCancerous changes. Discussed treatment course, expected reaction, and possible side effects. - Recommend daily broad spectrum sunscreen SPF 30+ to sun-exposed areas, reapply every 2 hours as needed.  - Staying in the shade or wearing long sleeves, sun glasses (UVA+UVB protection) and wide brim hats (4-inch brim around the entire circumference of the hat) are also recommended. - Call for new or changing lesions.  -Recommend PDT vs topical 5FU/VitD cream for treatment of AKs to face (L cheek, L nasal dorsum), chest BID for 5-7 days until reaction. Patient will call her insurance and ask about deductible and pending that will schedule red light PDT with debridement to face.    DERMATOFIBROMA Exam: Firm pink/brown papulenodule with dimple sign at L lower leg  Treatment Plan: A dermatofibroma is a benign growth possibly related to trauma, such as an insect bite, cut from shaving, or inflamed acne-type bump.  Treatment options to remove include shave or excision with resulting scar and risk of recurrence.  Since benign-appearing and not bothersome, will observe for now.    Rosacea Exam: Face mild mid face erythema   Chronic condition with duration or expected duration over one year. Currently well-controlled.    Rosacea is a chronic progressive skin condition usually affecting the face of adults, causing redness  and/or acne bumps. It is treatable but not curable. It sometimes affects the eyes (ocular rosacea) as well. It may respond to topical and/or systemic medication and can flare with stress, sun exposure, alcohol, exercise, topical steroids (including hydrocortisone/cortisone 10) and some foods.  Daily application of broad spectrum spf 30+ sunscreen to face is recommended to reduce flares.    Treatment plan: Mild. Pt defers Rx treatment today. Continue daily sunscreen   AK (ACTINIC KERATOSIS) (6) R medial cheek x1, L cheek x4, L upper eyebrow x1 (6) Actinic keratoses are precancerous spots that appear secondary to cumulative UV radiation exposure/sun exposure over time. They are chronic with expected duration over 1 year. A portion of actinic keratoses will progress to squamous cell carcinoma of the skin. It is not possible to reliably predict which spots will progress to skin cancer and so treatment is recommended to prevent development of skin cancer.  Recommend daily broad spectrum sunscreen SPF 30+ to sun-exposed areas, reapply every 2 hours as needed.  Recommend staying in the shade or wearing long sleeves, sun glasses (UVA+UVB protection) and wide brim hats (4-inch brim around the entire circumference of the hat). Call for new or changing lesions. Destruction of lesion - R medial cheek x1, L cheek x4, L upper eyebrow x1 (6)  Destruction method: cryotherapy   Informed consent: discussed and consent obtained   Lesion destroyed using liquid nitrogen: Yes   Region frozen until ice ball extended beyond lesion: Yes   Outcome: patient tolerated procedure well with no complications   Post-procedure details: wound care instructions  given   Additional details:  Prior to procedure, discussed risks of blister formation, small wound, skin dyspigmentation, or rare scar following cryotherapy. Recommend Vaseline ointment to treated areas while healing.  NEOPLASM OF SKIN (2) L spinal mid upper  back Epidermal / dermal shaving  Lesion diameter (cm):  0.7 Informed consent: discussed and consent obtained   Patient was prepped and draped in usual sterile fashion: area prepped with alcohol. Anesthesia: the lesion was anesthetized in a standard fashion   Anesthetic:  1% lidocaine w/ epinephrine 1-100,000 buffered w/ 8.4% NaHCO3 Instrument used: flexible razor blade   Hemostasis achieved with: pressure, aluminum chloride and electrodesiccation   Outcome: patient tolerated procedure well   Post-procedure details: wound care instructions given   Post-procedure details comment:  Ointment and small bandage applied Specimen 1 - Surgical pathology Differential Diagnosis: Nevus R/o dysplasia  Check Margins: No 5mm brown speckled macule R upper flank at braline Epidermal / dermal shaving  Lesion diameter (cm):  0.6 Informed consent: discussed and consent obtained   Patient was prepped and draped in usual sterile fashion: area prepped with alcohol. Anesthesia: the lesion was anesthetized in a standard fashion   Anesthetic:  1% lidocaine w/ epinephrine 1-100,000 buffered w/ 8.4% NaHCO3 Instrument used: flexible razor blade   Hemostasis achieved with: pressure, aluminum chloride and electrodesiccation   Outcome: patient tolerated procedure well   Post-procedure details: wound care instructions given   Post-procedure details comment:  Ointment and small bandage applied Specimen 2 - Surgical pathology Differential Diagnosis: Irritated Nevus vs Other  Check Margins: No 0.6cm fleshy brown papule  Discussed resulting small scar with shave removal, and possible recurrence of lesion.  Recommend vaseline ointment to area daily and cover until healed.  Recommend photoprotection/sunscreen to area to prevent discoloration of scar.  Return in about 1 year (around 01/26/2025) for TBSE, Hx SCCIS, Hx Dysplastic Nevi.  I, Soundra Pilon, CMA, am acting as scribe for [name], MD  .   Documentation: I have reviewed the above documentation for accuracy and completeness, and I agree with the above.  [name], MD

## 2024-01-30 LAB — SURGICAL PATHOLOGY

## 2024-02-02 ENCOUNTER — Telehealth: Payer: Self-pay

## 2024-02-02 NOTE — Telephone Encounter (Signed)
-----   Message from [name] sent at 02/02/2024  1:06 PM EDT ----- 1. Skin, L spinal mid upper back :       DYSPLASTIC COMPOUND NEVUS WITH SEVERE ATYPIA, MARGIN CLOSE, SEE DESCRIPTION  2. Skin, R upper flank at braline :       MELANOCYTIC NEVUS, INTRADERMAL TYPE, IRRITATED   1. Severely atypical mole- needs excision 2. Benign irritated mole  - please call patient

## 2024-02-02 NOTE — Telephone Encounter (Signed)
Discussed pathology results with patient and surgery scheduled.

## 2024-02-02 NOTE — Telephone Encounter (Signed)
Left message for patient to call for biopsy results. 

## 2024-02-23 ENCOUNTER — Ambulatory Visit: Admitting: Dermatology

## 2024-02-23 ENCOUNTER — Encounter: Payer: Self-pay | Admitting: Dermatology

## 2024-02-23 DIAGNOSIS — D492 Neoplasm of unspecified behavior of bone, soft tissue, and skin: Secondary | ICD-10-CM

## 2024-02-23 DIAGNOSIS — D235 Other benign neoplasm of skin of trunk: Secondary | ICD-10-CM

## 2024-02-23 NOTE — Patient Instructions (Signed)

## 2024-02-23 NOTE — Progress Notes (Signed)
   Follow-Up Visit   Subjective  Bethany Flores is a 58 y.o. female who presents for the following: Severe dysplastic nevus, bx proven of the L spinal mid upper back, pt presents for excision   The following portions of the chart were reviewed this encounter and updated as appropriate: medications, allergies, medical history  Review of Systems:  No other skin or systemic complaints except as noted in HPI or Assessment and Plan.  Objective  Well appearing patient in no apparent distress; mood and affect are within normal limits.   A focused examination was performed of the following areas: back  Relevant exam findings are noted in the Assessment and Plan.  L spinal mid upper back Pink bx site 8.35mm  Assessment & Plan   NEOPLASM OF SKIN L spinal mid upper back Skin excision  Excision method:  elliptical Lesion length (cm):  0.8 Lesion width (cm):  0.8 Margin per side (cm):  0.2 Total excision diameter (cm):  1.2 Informed consent: discussed and consent obtained   Timeout: patient name, date of birth, surgical site, and procedure verified   Procedure prep:  Patient was prepped and draped in usual sterile fashion Prep type:  Povidone-iodine Anesthesia: the lesion was anesthetized in a standard fashion   Anesthetic:  1% lidocaine w/ epinephrine 1-100,000 buffered w/ 8.4% NaHCO3 (6cc lido w/ epi, 6cc bupivicaine, total of 12cc) Instrument used: #15 blade   Hemostasis achieved with: pressure and electrodesiccation   Outcome: patient tolerated procedure well with no complications   Additional details:  Tag 12:00 superior tip  Skin repair Complexity:  Intermediate Final length (cm):  2.7 Informed consent: discussed and consent obtained   Reason for type of repair: reduce tension to allow closure, reduce the risk of dehiscence, infection, and necrosis, reduce subcutaneous dead space and avoid a hematoma, preserve normal anatomical and functional relationships and enhance both  functionality and cosmetic results   Undermining: edges undermined   Subcutaneous layers (deep stitches):  Suture size:  3-0 Suture type: Vicryl (polyglactin 910)   Subcutaneous suture technique: inverted dermal. Fine/surface layer approximation (top stitches):  Suture size:  3-0 Suture type: nylon   Suture type comment:  Nylon Stitches: vertical mattress and simple interrupted   Stitches comment:  2 vertical mattress, 5 simple interrupted Suture removal (days):  7 Hemostasis achieved with: suture Outcome: patient tolerated procedure well with no complications   Post-procedure details: sterile dressing applied and wound care instructions given   Dressing type: pressure dressing (Mupirocin ointment)   Specimen 1 - Surgical pathology Differential Diagnosis: Bx proven severe dysplastic nevus  Check Margins: yes Pink bx site 8.17mm [id number] Tag 12:00 sup tip  Return in about 1 week (around 03/01/2024) for suture removal.  I, Sonya Hupman, RMA, am acting as scribe for [name], MD .   Documentation: I have reviewed the above documentation for accuracy and completeness, and I agree with the above.  [name], MD

## 2024-02-24 ENCOUNTER — Telehealth: Payer: Self-pay

## 2024-02-24 NOTE — Telephone Encounter (Signed)
 Left pt msg to call if any problems after yesterday's surgery.Bethany Flores

## 2024-02-27 LAB — SURGICAL PATHOLOGY

## 2024-03-03 ENCOUNTER — Ambulatory Visit (INDEPENDENT_AMBULATORY_CARE_PROVIDER_SITE_OTHER): Admitting: Dermatology

## 2024-03-03 DIAGNOSIS — D239 Other benign neoplasm of skin, unspecified: Secondary | ICD-10-CM

## 2024-03-03 DIAGNOSIS — D235 Other benign neoplasm of skin of trunk: Secondary | ICD-10-CM

## 2024-03-03 NOTE — Patient Instructions (Signed)

## 2024-03-03 NOTE — Progress Notes (Signed)
   Follow-Up Visit   Subjective  Bethany Flores is a 58 y.o. female who presents for the following: Suture removal  Pathology showed dysplastic nevus, margins free.  The following portions of the chart were reviewed this encounter and updated as appropriate: medications, allergies, medical history  Review of Systems:  No other skin or systemic complaints except as noted in HPI or Assessment and Plan.  Objective  Well appearing patient in no apparent distress; mood and affect are within normal limits.  Areas Examined: Back  Relevant physical exam findings are noted in the Assessment and Plan.    Assessment & Plan    Encounter for Removal of Sutures - Incision site is clean, dry and intact. Some redness noted at excision edge due to irritation from sutures - Wound cleansed, sutures removed, wound cleansed and steri strips applied.  - Discussed pathology results showing dysplastic nevus, margins free - Patient advised to keep steri-strips dry until they fall off. - Scars remodel for a full year. - Once steri-strips fall off, patient can apply over-the-counter silicone scar cream once to twice a day to help with scar remodeling if desired. - Patient advised to call with any concerns or if they notice any new or changing lesions.  Return as scheduled for TBSE.  IBernardine Bridegroom, CMA, am acting as scribe for Artemio Larry, MD .   Documentation: I have reviewed the above documentation for accuracy and completeness, and I agree with the above.  Artemio Larry, MD

## 2024-06-29 ENCOUNTER — Ambulatory Visit
Admission: RE | Admit: 2024-06-29 | Discharge: 2024-06-29 | Disposition: A | Discharge status: Home / Self Care | Source: Ambulatory Visit | Attending: Chiropractic Medicine | Admitting: Chiropractic Medicine

## 2024-06-29 ENCOUNTER — Ambulatory Visit (INDEPENDENT_AMBULATORY_CARE_PROVIDER_SITE_OTHER): Admitting: Family Medicine

## 2024-06-29 ENCOUNTER — Other Ambulatory Visit: Payer: Self-pay | Admitting: Chiropractic Medicine

## 2024-06-29 ENCOUNTER — Encounter: Payer: Self-pay | Admitting: Family Medicine

## 2024-06-29 VITALS — BP 110/74 | Ht 65.0 in | Wt 175.0 lb

## 2024-06-29 DIAGNOSIS — M7542 Impingement syndrome of left shoulder: Secondary | ICD-10-CM

## 2024-06-29 MED ORDER — METHYLPREDNISOLONE ACETATE 40 MG/ML IJ SUSP
40.0000 mg | Freq: Once | INTRAMUSCULAR | Status: AC
Start: 2024-06-29 — End: 2024-06-29
  Administered 2024-06-29 (×2): 40 mg via INTRA_ARTICULAR

## 2024-06-29 NOTE — Patient Instructions (Signed)

## 2024-06-29 NOTE — Progress Notes (Signed)
 DATE OF VISIT: 06/29/2024        [place] DELE[place] Sprinkles DOB: 12-31-1965 MRN: [id number]  CC:  Left shoulder pain  History of present Illness: Bethany Flores is a 58 y.o. female who presents for evaluation of left shoulder pain that has been ongoing for the past 6 weeks.   Referred by chiropractor Dr. Venetia Ellen Patient denies any acute injury or trauma to the shoulder.   She denies any past injuries or surgeries to the shoulder.   She reports that the pain is exacerbated by overhead activity.   She additionally reports relief from chiropractic manipulation, however relief is transient.   She additionally reports occasional use of Tylenol  and Advil  with some relief.   She notes that she has not trialed physical therapy or home exercise program.  Denies prior issues with the shoulder  Medications:  Outpatient Encounter Medications as of 06/29/2024  Medication Sig   acetaminophen  (TYLENOL ) 500 MG tablet Take 1,000 mg by mouth every 6 (six) hours as needed.   ascorbic acid  (VITAMIN C) 500 MG/5ML LIQD liquid Take 5 mLs (500 mg total) by mouth daily.   Calcium  Carbonate-Vitamin D  600-200 MG-UNIT TABS Take 1 tablet by mouth daily with lunch.    iron  polysaccharides (NIFEREX) 150 MG capsule Take 1 capsule (150 mg total) by mouth daily.   Multiple Vitamins-Minerals (MULTIVITAMIN ADULT) TABS Take 1 tablet by mouth daily.   Omega-3 Fatty Acids (FISH OIL) 1000 MG CAPS Take by mouth.   saccharomyces boulardii (FLORASTOR) 250 MG capsule Take 1 capsule (250 mg total) by mouth 2 (two) times daily.   sertraline  (ZOLOFT ) 100 MG tablet Take 100 mg by mouth daily.   sertraline  (ZOLOFT ) 100 MG tablet Take 1 tablet by mouth daily.   tirzepatide (ZEPBOUND) 5 MG/0.5ML Pen Inject 5 mg into the skin once a week.   [EXPIRED] methylPREDNISolone  acetate (DEPO-MEDROL ) injection 40 mg    No facility-administered encounter medications on file as of 06/29/2024.    Allergies: has no known allergies.  Physical  Examination: Vitals: BP 110/74   Ht 5' 5 (1.651 m)   Wt 175 lb (79.4 kg)   LMP 04/24/2018   BMI 29.12 kg/m  GENERAL:  Bethany Flores is a 58 y.o. female appearing their stated age, alert and oriented x 3, in no apparent distress.  SKIN: no rashes or lesions, skin clean, dry, intact MSK:  Left shoulder: Symmetrical appearance to the contralateral shoulder, no focal swelling or outward signs of trauma.  Tenderness to palpation over the anterior shoulder over the proximal biceps tendon and coracoid and over greater tuberosity.  No tenderness over the [place] joint, AC joint, clavicle, or posterior shoulder.  Intact active and passive range of motion in all planes with (+)painful arch.  5/5 strength with ER, IR, adduction, abduction, flexion, and extension.  Patient with pain with resisted external rotation. Negative Neer's, (+)Hawkins, (+)empty can, and negative O'Brien's test.  Scarf test elicits mild pain over the left AC joint.  Speeds test eliciting pain over the anterior shoulder.  Negative Drop Arm test  Right shoulder: Symmetrical appearance to contralateral shoulder, no focal swelling or outward signs of trauma.  No focal tenderness to palpation.  Intact active and passive range of motion in all planes.  5/5 strength with ER, IR, adduction, abduction, flexion, and extension.  Negative Neer's, Hawkins, speeds, empty can, and O'Briens.  NEURO: sensation intact to light touch, DTR 2/4 bicep, tricep, brachial radialis bilaterally VASC: pulses 2+ and symmetric bilaterally, no  edema  Radiology: XRAY:  Left shoulder x-ray 06/29/2024 3 views personally reviewed and interpreted by me today showing: - preserved glenohumeral joint space - Slight AC joint space narrowing - flat type I acromion - No acute fractures or other bony abnormalities noted  Assessment & Plan Impingement syndrome of left shoulder Patient presents for evaluation of 6 weeks of left anterior lateral shoulder pain.  Patient  denying any acute trauma or injury.  Physical exam demonstrates tenderness to palpation over the anterior shoulder, proximal biceps tendon, and greater tuberosity.  Positive impingement testing on exam and positive painful arc  Given the patient's symptom profile and physical exam findings differential for the patient's shoulder pain includes subacromial impingement, rotator cuff tendonitis, biceps tendinopathy   PLAN: - X-rays ordered by chiropractor personally reviewed and interpreted today as noted above - Discussed treatment options with patient including conservative therapy including NSAIDs, PT, and home exercise program, as well as diagnostic and therapeutic corticosteroid injection.  Patient electing to pursue subacromial injection.  This was completed today as noted below.  If patient's pain fails to improve following subacromial injection can consider additional imaging with ultrasound or MRI -Heat or ice as needed - Can use Voltaren gel as needed - Home exercise program provided - Follow-up 6 weeks for reevaluation, sooner as needed  PROCEDURE:  Risks & benefits of left shoulder subacromial injection reviewed. Consent obtained. Time-out completed. Patient prepped and draped in the normal fashion. Area cleansed with alcohol. Ethyl chloride spray used to anesthetize the skin. Solution of 4 mL 1% lidocaine  with 1 mL methylprednisolone  (Depo-medrol ) 40mg /mL injected into the left subacromial space using a 25-gauge 1.5-inch needle via the posterior approach. Patient tolerated procedure well without any complications. Area covered with adhesive bandage. Post-procedure care reviewed. All questions answered.  Injection completed with the assistance of Robert Gallivan, MD PGY3 today.  Patient expressed understanding & agreement with above.  Encounter Diagnosis  Name Primary?   Impingement syndrome of left shoulder Yes    No orders of the defined types were placed in this encounter.

## 2024-08-16 ENCOUNTER — Other Ambulatory Visit: Payer: Self-pay | Admitting: Family Medicine

## 2024-08-16 DIAGNOSIS — Z1231 Encounter for screening mammogram for malignant neoplasm of breast: Secondary | ICD-10-CM

## 2024-09-07 ENCOUNTER — Ambulatory Visit
Admission: RE | Admit: 2024-09-07 | Discharge: 2024-09-07 | Disposition: A | Discharge status: Home / Self Care | Source: Ambulatory Visit | Attending: Family Medicine | Admitting: Family Medicine

## 2024-09-07 DIAGNOSIS — Z1231 Encounter for screening mammogram for malignant neoplasm of breast: Secondary | ICD-10-CM | POA: Insufficient documentation

## 2025-02-08 ENCOUNTER — Encounter: Admitting: Dermatology

## 2025-02-15 ENCOUNTER — Encounter: Admitting: Dermatology
